# Patient Record
Sex: Female | Born: 1980 | Race: White | Hispanic: No | Marital: Married | Smoking: Never smoker
Health system: Southern US, Community
[De-identification: ages and names within clinical notes are randomized; demographics above are authoritative.]

## PROBLEM LIST (undated history)

## (undated) DIAGNOSIS — R002 Palpitations: Secondary | ICD-10-CM

## (undated) DIAGNOSIS — R0600 Dyspnea, unspecified: Secondary | ICD-10-CM

## (undated) DIAGNOSIS — F419 Anxiety disorder, unspecified: Secondary | ICD-10-CM

## (undated) DIAGNOSIS — O24419 Gestational diabetes mellitus in pregnancy, unspecified control: Secondary | ICD-10-CM

## (undated) HISTORY — DX: Palpitations: R00.2

## (undated) HISTORY — DX: Anxiety disorder, unspecified: F41.9

## (undated) HISTORY — DX: Dyspnea, unspecified: R06.00

## (undated) HISTORY — PX: APPENDECTOMY: SHX54

## (undated) HISTORY — DX: Gestational diabetes mellitus in pregnancy, unspecified control: O24.419

---

## 2003-04-16 ENCOUNTER — Ambulatory Visit (HOSPITAL_COMMUNITY): Admission: RE | Admit: 2003-04-16 | Discharge: 2003-04-16 | Payer: Self-pay | Admitting: Orthopedic Surgery

## 2003-04-16 ENCOUNTER — Encounter: Payer: Self-pay | Admitting: Orthopedic Surgery

## 2004-12-02 ENCOUNTER — Ambulatory Visit: Payer: Self-pay | Admitting: Orthopaedic Surgery

## 2005-10-05 ENCOUNTER — Ambulatory Visit: Payer: Self-pay | Admitting: Internal Medicine

## 2005-10-10 ENCOUNTER — Ambulatory Visit: Payer: Self-pay | Admitting: Internal Medicine

## 2005-10-16 ENCOUNTER — Ambulatory Visit: Payer: Self-pay | Admitting: Rheumatology

## 2005-10-19 ENCOUNTER — Ambulatory Visit: Payer: Self-pay | Admitting: Internal Medicine

## 2005-10-19 ENCOUNTER — Encounter: Payer: Self-pay | Admitting: Internal Medicine

## 2005-10-20 ENCOUNTER — Encounter (INDEPENDENT_AMBULATORY_CARE_PROVIDER_SITE_OTHER): Payer: Self-pay | Admitting: Specialist

## 2005-10-20 ENCOUNTER — Ambulatory Visit: Payer: Self-pay | Admitting: Internal Medicine

## 2009-09-20 ENCOUNTER — Telehealth: Payer: Self-pay | Admitting: Internal Medicine

## 2009-09-20 ENCOUNTER — Encounter: Payer: Self-pay | Admitting: Internal Medicine

## 2009-09-27 ENCOUNTER — Ambulatory Visit: Payer: Self-pay | Admitting: Internal Medicine

## 2009-09-27 DIAGNOSIS — L290 Pruritus ani: Secondary | ICD-10-CM | POA: Insufficient documentation

## 2015-10-18 ENCOUNTER — Encounter: Payer: Self-pay | Admitting: Internal Medicine

## 2018-07-14 ENCOUNTER — Encounter (HOSPITAL_COMMUNITY): Payer: Self-pay | Admitting: Emergency Medicine

## 2018-07-14 ENCOUNTER — Emergency Department (HOSPITAL_COMMUNITY): Payer: Self-pay

## 2018-07-14 ENCOUNTER — Other Ambulatory Visit: Payer: Self-pay

## 2018-07-14 ENCOUNTER — Emergency Department (HOSPITAL_COMMUNITY)
Admission: EM | Admit: 2018-07-14 | Discharge: 2018-07-14 | Disposition: A | Discharge status: Home / Self Care | Payer: Self-pay | Attending: Emergency Medicine | Admitting: Emergency Medicine

## 2018-07-14 ENCOUNTER — Emergency Department (HOSPITAL_COMMUNITY): Admission: EM | Admit: 2018-07-14 | Payer: Self-pay | Source: Home / Self Care

## 2018-07-14 DIAGNOSIS — R253 Fasciculation: Secondary | ICD-10-CM | POA: Insufficient documentation

## 2018-07-14 DIAGNOSIS — R202 Paresthesia of skin: Secondary | ICD-10-CM | POA: Insufficient documentation

## 2018-07-14 DIAGNOSIS — G4700 Insomnia, unspecified: Secondary | ICD-10-CM | POA: Insufficient documentation

## 2018-07-14 LAB — CBC WITH DIFFERENTIAL/PLATELET
Abs Immature Granulocytes: 0 10*3/uL (ref 0.0–0.1)
BASOS ABS: 0.1 10*3/uL (ref 0.0–0.1)
BASOS PCT: 1 %
EOS ABS: 0.1 10*3/uL (ref 0.0–0.7)
Eosinophils Relative: 2 %
HCT: 45.5 % (ref 36.0–46.0)
Hemoglobin: 14.4 g/dL (ref 12.0–15.0)
Immature Granulocytes: 0 %
Lymphocytes Relative: 20 %
Lymphs Abs: 1.5 10*3/uL (ref 0.7–4.0)
MCH: 29 pg (ref 26.0–34.0)
MCHC: 31.6 g/dL (ref 30.0–36.0)
MCV: 91.5 fL (ref 78.0–100.0)
Monocytes Absolute: 0.4 10*3/uL (ref 0.1–1.0)
Monocytes Relative: 5 %
Neutro Abs: 5.6 10*3/uL (ref 1.7–7.7)
Neutrophils Relative %: 72 %
PLATELETS: 271 10*3/uL (ref 150–400)
RBC: 4.97 MIL/uL (ref 3.87–5.11)
RDW: 13.2 % (ref 11.5–15.5)
WBC: 7.7 10*3/uL (ref 4.0–10.5)

## 2018-07-14 LAB — COMPREHENSIVE METABOLIC PANEL
ALT: 15 U/L (ref 0–44)
AST: 18 U/L (ref 15–41)
Albumin: 4.5 g/dL (ref 3.5–5.0)
Alkaline Phosphatase: 46 U/L (ref 38–126)
Anion gap: 8 (ref 5–15)
BUN: 7 mg/dL (ref 6–20)
CHLORIDE: 107 mmol/L (ref 98–111)
CO2: 24 mmol/L (ref 22–32)
Calcium: 9.3 mg/dL (ref 8.9–10.3)
Creatinine, Ser: 0.71 mg/dL (ref 0.44–1.00)
GFR calc Af Amer: >60 mL/min (ref >60)
Glucose, Bld: 94 mg/dL (ref 70–99)
POTASSIUM: 4.1 mmol/L (ref 3.5–5.1)
SODIUM: 139 mmol/L (ref 135–145)
Total Bilirubin: 1 mg/dL (ref 0.3–1.2)
Total Protein: 7 g/dL (ref 6.5–8.1)

## 2018-07-14 LAB — I-STAT BETA HCG BLOOD, ED (MC, WL, AP ONLY)

## 2018-07-14 MED ORDER — GADOBUTROL 1 MMOL/ML IV SOLN
7.5000 mL | Freq: Once | INTRAVENOUS | Status: AC | PRN
  Administered 2018-07-14: 5.5 mL via INTRAVENOUS

## 2018-07-14 MED ORDER — LORAZEPAM 1 MG PO TABS
1.0000 mg | ORAL_TABLET | Freq: Once | ORAL | Status: AC
  Administered 2018-07-14: 1 mg via ORAL
  Filled 2018-07-14: qty 1

## 2018-07-14 MED ORDER — HYDROXYZINE HCL 25 MG PO TABS: 25.0000 mg | ORAL_TABLET | Freq: Four times a day (QID) | ORAL | 0 refills | Status: DC

## 2018-07-14 NOTE — ED Triage Notes (Signed)
Pt. Stated, for the last couple of months, I have not felt like I could rest and something is going on inside my body, I feel like certain parts are twitching. Like this morning I felt like my nose was twitching. It feels like little places in my brain are in pain.It seems to be worse in the last 2 days.

## 2018-07-14 NOTE — ED Provider Notes (Signed)
[facility] EMERGENCY DEPARTMENT Provider Note   CSN: [id number] Arrival date & time: 07/14/18  1011     History   Chief Complaint Chief Complaint  Patient presents with  . Anxiety    HPI Tonya Harris is a 37 y.o. female who presents with pain, twitching. She states that her symptoms started a couple months ago and have gradually worsened. She states that it started with shooting pains in her bilateral hands. This has progressed where she's had sharp shooting pains in her head, chest, lower legs. She also reports spasms/tremors in the upper and lower extremities at times. The symptoms are intermittent but worse at night and she isn't sleeping well. She is getting to the point where ADLs are hard. She reports a mild generalized headache. She has seen her PCP and neurology. She has had extensive blood work done, including a rheumatologic work up, which has been normal. She saw Neurology on 9/6 and was recommended to have a nerve conduction study. This is scheudled in a month however she feels that her symptoms are too much to handle. They recommended EMG/NCS and if that was negative MRI brain w/wo gad and possible c-spine imaging. The patient is breast feeding. She denies recent illness, vision changes, numbness, dizziness, urinary symptoms.    HPI  History reviewed. No pertinent past medical history.  Patient Active Problem List   Diagnosis Date Noted  . ANAL PRURITUS 09/27/2009    History reviewed. No pertinent surgical history.   OB History   None      Home Medications    Prior to Admission medications   Not on File    Family History No family history on file.  Social History Social History   Tobacco Use  . Smoking status: Never Smoker  . Smokeless tobacco: Never Used  Substance Use Topics  . Alcohol use: Not Currently  . Drug use: Not Currently     Allergies   Patient has no allergy information on record.   Review of  Systems Review of Systems  Constitutional: Negative for fever.  Eyes: Negative for visual disturbance.  Respiratory: Negative for shortness of breath.   Cardiovascular: Negative for chest pain.  Gastrointestinal: Negative for abdominal pain.  Genitourinary: Negative for dysuria.  Musculoskeletal: Positive for arthralgias, back pain and myalgias.  Neurological: Positive for tremors and headaches. Negative for dizziness, syncope, facial asymmetry, speech difficulty and numbness.  Psychiatric/Behavioral: The patient is nervous/anxious.   All other systems reviewed and are negative.    Physical Exam Updated Vital Signs BP 116/79 (BP Location: Right Arm)   Pulse 98   Temp 98.7 F (37.1 C)   Resp 17   Ht 5\' 5"  (1.651 m)   Wt 59 kg   LMP 06/30/2018   SpO2 100%   BMI 21.63 kg/m   Physical Exam  Constitutional: She is oriented to person, place, and time. She appears well-developed and well-nourished. No distress.  Calm, cooperative, well-groomed  HENT:  Head: Normocephalic and atraumatic.  Eyes: Pupils are equal, round, and reactive to light. Conjunctivae are normal. Right eye exhibits no discharge. Left eye exhibits no discharge. No scleral icterus.  Neck: Normal range of motion.  Cardiovascular: Normal rate and regular rhythm.  Pulmonary/Chest: Effort normal and breath sounds normal. No respiratory distress.  Abdominal: She exhibits no distension.  Neurological: She is alert and oriented to person, place, and time.  Mental Status:  Alert, oriented, thought content appropriate, able to give a coherent history. Speech  fluent without evidence of aphasia. Able to follow 2 step commands without difficulty.  Cranial Nerves:  II:  Peripheral visual fields grossly normal, pupils equal, round, reactive to light III,IV, VI: ptosis not present, extra-ocular motions intact bilaterally  V,VII: smile symmetric, facial light touch sensation equal VIII: hearing grossly normal to voice  X:  uvula elevates symmetrically  XI: bilateral shoulder shrug symmetric and strong XII: midline tongue extension without fassiculations Motor:  Normal tone. 5/5 in upper and lower extremities bilaterally including strong and equal grip strength and dorsiflexion/plantar flexion Sensory: Pinprick and light touch normal in all extremities.  Deep Tendon Reflexes: Hyper-reflexic in the biceps and patellar reflex Cerebellar: normal finger-to-nose with bilateral upper extremities Gait: normal gait and balance CV: distal pulses palpable throughout    Skin: Skin is warm and dry.  Psychiatric: She has a normal mood and affect. Her behavior is normal.  Nursing note and vitals reviewed.    ED Treatments / Results  Labs (all labs ordered are listed, but only abnormal results are displayed) Labs Reviewed  CBC WITH DIFFERENTIAL/PLATELET  COMPREHENSIVE METABOLIC PANEL  I-STAT BETA HCG BLOOD, ED ([facility], [facility], [facility] ONLY)    EKG None  Radiology Mr Laqueta Jean And Wo Contrast  Result Date: 07/14/2018 CLINICAL DATA:  Multiple sclerosis.  New neurologic event. EXAM: MRI HEAD WITHOUT AND WITH CONTRAST TECHNIQUE: Multiplanar, multiecho pulse sequences of the brain and surrounding structures were obtained without and with intravenous contrast. CONTRAST:  5.5 mL Gadavist COMPARISON:  None. FINDINGS: Brain: No acute infarct, hemorrhage, or mass lesion is present. The ventricles are of normal size. No significant white matter disease is present. Corpus callosum is normal. No restricted diffusion or enhancement is present. The internal auditory canals are within normal limits bilaterally. The brainstem and cerebellum are normal. Vascular: Low is present in the major intracranial arteries. Skull and upper cervical spine: The skull base is within normal limits is normal. Craniocervical junction Sinuses/Orbits: The paranasal sinuses and mastoid air cells are clear. Globes and orbits are within normal limits. IMPRESSION: Negative  MRI the brain. No significant white matter disease is evident. No acute or focal lesion to explain the patient's acute symptoms. Electronically Signed   By: Marin Roberts M.D.   On: 07/14/2018 13:26    Procedures Procedures (including critical care time)  Medications Ordered in ED Medications  LORazepam (ATIVAN) tablet 1 mg (1 mg Oral Given 07/14/18 1137)  gadobutrol (GADAVIST) 1 MMOL/ML injection 7.5 mL (5.5 mLs Intravenous Contrast Given 07/14/18 1312)     Initial Impression / Assessment and Plan / ED Course  I have reviewed the triage vital signs and the nursing notes.  Pertinent labs & imaging results that were available during my care of the patient were reviewed by me and considered in my medical decision making (see chart for details).  37 year old female with vague symptoms of intermittent tingling and jerking over her entire body. Her vitals are normal. Her neurologic exam is essentially normal. She does seem to have some hyperreflexia. Unclear etiology of her symptoms. She does seem to be anxious and it seems that previous providers thought maybe anxiety was a factor. The patient does state that she feels anxious but due to her symptoms. Reviewed old records. She saw a neurologist a couple days ago and MRI of the brain was recommended if EMG studies were negative. We will obtain this today.   MRI is negative. Discussed with the patient and her husband. She has been googling her  symptoms and worried she has a variety of conditions such as ALS or possible spread of herpes in to her brain. She questioned whether or not a LP was indicated. I advised her that it is not and she should call her her neurologist for close follow up. She was given Ativan for her symptoms and reported some improvement. She requested something for anxiety for home. She was given rx for Atarax and advised to not breast feed if she takes this medicine. Her child is 67 year old and she verbalized  understanding.  Final Clinical Impressions(s) / ED Diagnoses   Final diagnoses:  Tingling of both upper extremities  Muscle twitching  Insomnia, unspecified type    ED Discharge Orders    None       Bethel Born, PA-C 07/14/18 1504    Jacalyn Lefevre, MD 07/14/18 1520

## 2018-07-14 NOTE — ED Triage Notes (Signed)
Pt. Stated, Tonya Harris had a visit to my Dr. And everything has came back normal.

## 2018-07-14 NOTE — ED Notes (Signed)
Returned from MRI 

## 2018-07-14 NOTE — Discharge Instructions (Signed)
Please follow up with neurology Take Atarax as needed for sleep. Do not breastfeed if you are taking this medicine

## 2018-07-14 NOTE — ED Notes (Signed)
Patient transported to MRI 

## 2018-08-09 ENCOUNTER — Telehealth: Payer: Self-pay

## 2018-08-09 NOTE — Telephone Encounter (Signed)
Copied from CRM [id number]. Topic: Appointment Scheduling - New Patient >> Aug 08, 2018  4:27 PM Percival Spanish wrote: New patient has been scheduled for your office. Provider:  [facility]  Date of Appointmen 09/20/18 Route to department's PEC pool.

## 2018-08-12 NOTE — Telephone Encounter (Signed)
Np paperwork mailed out on 10/07 ry

## 2018-08-15 NOTE — Telephone Encounter (Signed)
Pt states that she sees Dr. Maryruth Bun and she stated to her that she contacted Dr. Lorin Picket and Dr. Lorin Picket stated she would make an exception for his pt to become a new pt of hers. Please advise and call pt to schedule.

## 2018-08-15 NOTE — Telephone Encounter (Signed)
This ok? 

## 2018-08-16 NOTE — Telephone Encounter (Signed)
Ok

## 2018-08-16 NOTE — Telephone Encounter (Signed)
Lm for pt to call back and ask for Carlyon Shadow

## 2018-08-20 NOTE — Telephone Encounter (Signed)
Pt called back for [facility]. Per Baxter Hire she is gone for today. Pt states ok to call back at [phone].

## 2018-08-21 NOTE — Telephone Encounter (Signed)
Patient calling and states that she would like to speak with Morrie Sheldon. Spoke with office and was told that Morrie Sheldon is out of the office today and will be back tomorrow. Patient informed of message and would like a call back from [place]. Please advise.

## 2018-08-21 NOTE — Telephone Encounter (Signed)
Pt calling back to speak with Morrie Sheldon C.

## 2018-08-21 NOTE — Telephone Encounter (Signed)
I scheduled this patient for December 10 at 9:00 and told her to make sure new patient paperwork was completed before arrival for appt. Advised that Morrie Sheldon was not in the office today and that one of [place] would contact her if appt was able to be moved up.

## 2018-09-02 ENCOUNTER — Ambulatory Visit (INDEPENDENT_AMBULATORY_CARE_PROVIDER_SITE_OTHER): Payer: Self-pay | Admitting: Internal Medicine

## 2018-09-02 ENCOUNTER — Encounter: Payer: Self-pay | Admitting: Internal Medicine

## 2018-09-02 DIAGNOSIS — F419 Anxiety disorder, unspecified: Secondary | ICD-10-CM

## 2018-09-02 DIAGNOSIS — L749 Eccrine sweat disorder, unspecified: Secondary | ICD-10-CM

## 2018-09-02 DIAGNOSIS — R253 Fasciculation: Secondary | ICD-10-CM | POA: Insufficient documentation

## 2018-09-02 DIAGNOSIS — Z8632 Personal history of gestational diabetes: Secondary | ICD-10-CM

## 2018-09-02 DIAGNOSIS — M791 Myalgia, unspecified site: Secondary | ICD-10-CM

## 2018-09-02 LAB — CBC WITH DIFFERENTIAL/PLATELET
Basophils Absolute: 0 10*3/uL (ref 0.0–0.1)
Basophils Relative: 0.5 % (ref 0.0–3.0)
EOS ABS: 0.1 10*3/uL (ref 0.0–0.7)
Eosinophils Relative: 2.1 % (ref 0.0–5.0)
HEMATOCRIT: 39 % (ref 36.0–46.0)
HEMOGLOBIN: 12.9 g/dL (ref 12.0–15.0)
LYMPHS PCT: 24.4 % (ref 12.0–46.0)
Lymphs Abs: 1.6 10*3/uL (ref 0.7–4.0)
MCHC: 33.1 g/dL (ref 30.0–36.0)
MCV: 88.5 fl (ref 78.0–100.0)
MONOS PCT: 4.5 % (ref 3.0–12.0)
Monocytes Absolute: 0.3 10*3/uL (ref 0.1–1.0)
NEUTROS ABS: 4.5 10*3/uL (ref 1.4–7.7)
Neutrophils Relative %: 68.5 % (ref 43.0–77.0)
PLATELETS: 278 10*3/uL (ref 150.0–400.0)
RBC: 4.4 Mil/uL (ref 3.87–5.11)
RDW: 13.6 % (ref 11.5–15.5)
WBC: 6.5 10*3/uL (ref 4.0–10.5)

## 2018-09-02 LAB — SEDIMENTATION RATE: Sed Rate: 1 mm/hr (ref 0–20)

## 2018-09-02 LAB — COMPREHENSIVE METABOLIC PANEL
ALBUMIN: 4.7 g/dL (ref 3.5–5.2)
ALK PHOS: 38 U/L — AB (ref 39–117)
ALT: 14 U/L (ref 0–35)
AST: 15 U/L (ref 0–37)
BILIRUBIN TOTAL: 0.6 mg/dL (ref 0.2–1.2)
BUN: 10 mg/dL (ref 6–23)
CO2: 28 mEq/L (ref 19–32)
CREATININE: 0.68 mg/dL (ref 0.40–1.20)
Calcium: 9.5 mg/dL (ref 8.4–10.5)
Chloride: 103 mEq/L (ref 96–112)
GFR: 104.44 mL/min (ref >60.00)
Glucose, Bld: 86 mg/dL (ref 70–99)
Potassium: 4.7 mEq/L (ref 3.5–5.1)
Sodium: 138 mEq/L (ref 135–145)
TOTAL PROTEIN: 6.9 g/dL (ref 6.0–8.3)

## 2018-09-02 LAB — CK: Total CK: 173 U/L (ref 7–177)

## 2018-09-02 LAB — TSH: TSH: 2.37 u[IU]/mL (ref 0.35–4.50)

## 2018-09-02 LAB — VITAMIN B12: Vitamin B-12: 791 pg/mL (ref 211–911)

## 2018-09-02 NOTE — Progress Notes (Signed)
Patient ID: Tonya Harris, female   DOB: Jan 19, 1981, 37 y.o.   MRN: [id number]   Subjective:    Patient ID: Tonya Harris, female    DOB: [birth year], 37 y.o.   MRN: [id number]  HPI  Patient here to establish care.  She has been healthy.  Has a history of gestational diabetes.  Has four children.  Tries to stay active.  Recently has been having problems with needle sensations all over her body.  Intermittent stabbing pain.  Also has noticed some muscle twitching and fatigue.  Full body fatigue.  Also has noticed hand tremor and muscles "jumping".  Legs tired with walking.  Aching at night.  Saw Dr Janith Lima at [facility] in [place], [place].  Had imaging and NCS - electromyography - normal.  MRI c-spine - 08/02/18 - no disc herniation or significant stenosis.  Normal cord signal.  MRI brain - negative.  Has seen psychiatry.  Diagnosed with anxiety.  On trazodone to help her sleep.  Also on sertraline, neurontin prn and abilify.  States her thoughts are better.  Still with the above symptoms.  Also describes increased sweating.  (states she is drenched).  Has noticed some red spots under her nails.     Past Medical History:  Diagnosis Date  . Gestational diabetes    Past Surgical History:  Procedure Laterality Date  . APPENDECTOMY     Family History  Problem Relation Age of Onset  . Hypertension Mother   . Hypertension Father    Social History   Socioeconomic History  . Marital status: Married    Spouse name: Not on file  . Number of children: Not on file  . Years of education: Not on file  . Highest education level: Not on file  Occupational History  . Not on file  Social Needs  . Financial resource strain: Not on file  . Food insecurity:    Worry: Not on file    Inability: Not on file  . Transportation needs:    Medical: Not on file    Non-medical: Not on file  Tobacco Use  . Smoking status: Never Smoker  . Smokeless tobacco: Never Used  Substance and Sexual Activity  .  Alcohol use: Not Currently  . Drug use: Not Currently  . Sexual activity: Yes  Lifestyle  . Physical activity:    Days per week: Not on file    Minutes per session: Not on file  . Stress: Not on file  Relationships  . Social connections:    Talks on phone: Not on file    Gets together: Not on file    Attends religious service: Not on file    Active member of club or organization: Not on file    Attends meetings of clubs or organizations: Not on file    Relationship status: Not on file  Other Topics Concern  . Not on file  Social History Narrative  . Not on file    Outpatient Encounter Medications as of 09/02/2018  Medication Sig  . ARIPiprazole (ABILIFY) 5 MG tablet Take 5 mg by mouth daily.  Marland Kitchen gabapentin (NEURONTIN) 100 MG capsule Take 100 mg by mouth 2 (two) times daily as needed.  . sertraline (ZOLOFT) 100 MG tablet Take 100 mg by mouth 2 (two) times daily.  . traZODone (DESYREL) 100 MG tablet Take 200 mg by mouth at bedtime.  . Prenatal Multivit-Min-Fe-FA (PRENATAL VITAMINS PO) Take by mouth.  . [DISCONTINUED] hydrOXYzine (ATARAX/VISTARIL) 25 MG tablet Take  1 tablet (25 mg total) by mouth every 6 (six) hours.   No facility-administered encounter medications on file as of 09/02/2018.     Review of Systems  Constitutional: Positive for fatigue. Negative for appetite change.  HENT: Negative for congestion and sinus pressure.   Respiratory: Negative for cough, chest tightness and shortness of breath.   Cardiovascular: Negative for chest pain and leg swelling.  Gastrointestinal: Negative for abdominal pain, diarrhea, nausea and vomiting.  Genitourinary: Negative for difficulty urinating and dysuria.  Musculoskeletal: Negative for joint swelling and myalgias.  Skin: Negative for color change.       Red spots under nails.    Neurological: Negative for dizziness and headaches.       Muscle twitching and stabbing pain as outlined.  Hand tremor.  Aching at night.      Psychiatric/Behavioral: Positive for sleep disturbance.       Increased stress and anxiety as outlined.        Objective:    Physical Exam  Constitutional: She appears well-developed and well-nourished. No distress.  HENT:  Nose: Nose normal.  Mouth/Throat: Oropharynx is clear and moist.  Neck: Neck supple. No thyromegaly present.  Cardiovascular: Normal rate and regular rhythm.  Pulmonary/Chest: Breath sounds normal. No respiratory distress. She has no wheezes.  Abdominal: Soft. Bowel sounds are normal. There is no tenderness.  Musculoskeletal: She exhibits no edema or tenderness.  Motor strength - normal upper and lower extremities.  Gait - wnl.    Lymphadenopathy:    She has no cervical adenopathy.  Skin: No rash noted. No erythema.  Psychiatric: She has a normal mood and affect. Her behavior is normal.    BP 102/68 (BP Location: Left Arm, Patient Position: Sitting, Cuff Size: Normal)   Pulse 72   Temp 97.9 F (36.6 C) (Oral)   Resp 18   Ht '5\' 5"'  (1.651 m)   Wt 139 lb 9.6 oz (63.3 kg)   SpO2 100%   BMI 23.23 kg/m  Wt Readings from Last 3 Encounters:  09/02/18 139 lb 9.6 oz (63.3 kg)  07/14/18 130 lb (59 kg)     Lab Results  Component Value Date   WBC 6.5 09/02/2018   HGB 12.9 09/02/2018   HCT 39.0 09/02/2018   PLT 278.0 09/02/2018   GLUCOSE 86 09/02/2018   ALT 14 09/02/2018   AST 15 09/02/2018   NA 138 09/02/2018   K 4.7 09/02/2018   CL 103 09/02/2018   CREATININE 0.68 09/02/2018   BUN 10 09/02/2018   CO2 28 09/02/2018   TSH 2.37 09/02/2018    Mr Brain W And Wo Contrast  Result Date: 07/14/2018 CLINICAL DATA:  Multiple sclerosis.  New neurologic event. EXAM: MRI HEAD WITHOUT AND WITH CONTRAST TECHNIQUE: Multiplanar, multiecho pulse sequences of the brain and surrounding structures were obtained without and with intravenous contrast. CONTRAST:  5.5 mL Gadavist COMPARISON:  None. FINDINGS: Brain: No acute infarct, hemorrhage, or mass lesion is present.  The ventricles are of normal size. No significant white matter disease is present. Corpus callosum is normal. No restricted diffusion or enhancement is present. The internal auditory canals are within normal limits bilaterally. The brainstem and cerebellum are normal. Vascular: Low is present in the major intracranial arteries. Skull and upper cervical spine: The skull base is within normal limits is normal. Craniocervical junction Sinuses/Orbits: The paranasal sinuses and mastoid air cells are clear. Globes and orbits are within normal limits. IMPRESSION: Negative MRI the brain. No significant white matter  disease is evident. No acute or focal lesion to explain the patient's acute symptoms. Electronically Signed   By: San Morelle M.D.   On: 07/14/2018 13:26       Assessment & Plan:   Problem List Items Addressed This Visit    Anxiety    Seeing Dr Nicolasa Ducking.  On trazodone to help her sleep.  Also taking zoloft and abilify.  Continue f/u with Dr Nicolasa Ducking.        Relevant Medications   sertraline (ZOLOFT) 100 MG tablet   traZODone (DESYREL) 100 MG tablet   History of gestational diabetes    Follow sugar.        Muscle ache    Unclear etiology.  No focal deficits noted on exam.  MRI brain and c-spine as outlined.  Saw neurology.  NCS/EMG - ok.  Check esr and ck.  Also confirm thyroid and B12 wnl.  Discuss with neurology regarding further w/up.        Relevant Orders   CBC with Differential/Platelet (Completed)   Comprehensive metabolic panel (Completed)   TSH (Completed)   Sedimentation rate (Completed)   CK (Creatine Kinase) (Completed)   Muscle twitching    Unclear etiology.  W/up as outlined.  NCS/EMG - ok.  MRI c-spine and MRI brain - normal.  Saw neurology.  D/w neurology regarding further w/up.        Relevant Orders   Vitamin B12 (Completed)   Sweating abnormality    Increased sweating as outlined.  Check thyroid and routine labs.  W/up as outlined. D/w neurology.            Einar Pheasant, MD

## 2018-09-03 ENCOUNTER — Encounter: Payer: Self-pay | Admitting: Internal Medicine

## 2018-09-08 ENCOUNTER — Encounter: Payer: Self-pay | Admitting: Internal Medicine

## 2018-09-08 DIAGNOSIS — L749 Eccrine sweat disorder, unspecified: Secondary | ICD-10-CM | POA: Insufficient documentation

## 2018-09-08 DIAGNOSIS — Z8632 Personal history of gestational diabetes: Secondary | ICD-10-CM | POA: Insufficient documentation

## 2018-09-08 DIAGNOSIS — F419 Anxiety disorder, unspecified: Secondary | ICD-10-CM | POA: Insufficient documentation

## 2018-09-08 NOTE — Assessment & Plan Note (Signed)
Unclear etiology.  W/up as outlined.  NCS/EMG - ok.  MRI c-spine and MRI brain - normal.  Saw neurology.  D/w neurology regarding further w/up.

## 2018-09-08 NOTE — Assessment & Plan Note (Signed)
Increased sweating as outlined.  Check thyroid and routine labs.  W/up as outlined. D/w neurology.

## 2018-09-08 NOTE — Assessment & Plan Note (Signed)
Seeing Dr Maryruth Bun.  On trazodone to help her sleep.  Also taking zoloft and abilify.  Continue f/u with Dr Maryruth Bun.

## 2018-09-08 NOTE — Assessment & Plan Note (Signed)
Follow sugar.

## 2018-09-08 NOTE — Assessment & Plan Note (Signed)
Unclear etiology.  No focal deficits noted on exam.  MRI brain and c-spine as outlined.  Saw neurology.  NCS/EMG - ok.  Check esr and ck.  Also confirm thyroid and B12 wnl.  Discuss with neurology regarding further w/up.

## 2018-09-20 ENCOUNTER — Ambulatory Visit: Payer: Self-pay | Admitting: Internal Medicine

## 2018-10-08 ENCOUNTER — Other Ambulatory Visit: Payer: Self-pay | Admitting: Neurology

## 2018-10-08 DIAGNOSIS — M791 Myalgia, unspecified site: Secondary | ICD-10-CM

## 2018-10-15 ENCOUNTER — Ambulatory Visit: Payer: Self-pay | Admitting: Internal Medicine

## 2018-10-25 ENCOUNTER — Ambulatory Visit
Admission: RE | Admit: 2018-10-25 | Discharge: 2018-10-25 | Disposition: A | Discharge status: Home / Self Care | Payer: Self-pay | Source: Ambulatory Visit | Attending: Neurology | Admitting: Neurology

## 2018-10-25 DIAGNOSIS — M5134 Other intervertebral disc degeneration, thoracic region: Secondary | ICD-10-CM | POA: Insufficient documentation

## 2018-10-25 DIAGNOSIS — M791 Myalgia, unspecified site: Secondary | ICD-10-CM | POA: Insufficient documentation

## 2018-10-25 DIAGNOSIS — R252 Cramp and spasm: Secondary | ICD-10-CM | POA: Insufficient documentation

## 2018-10-25 DIAGNOSIS — R61 Generalized hyperhidrosis: Secondary | ICD-10-CM | POA: Insufficient documentation

## 2018-12-19 ENCOUNTER — Encounter: Payer: Self-pay | Admitting: Internal Medicine

## 2018-12-19 ENCOUNTER — Other Ambulatory Visit (HOSPITAL_COMMUNITY)
Admission: RE | Admit: 2018-12-19 | Discharge: 2018-12-19 | Disposition: A | Discharge status: Home / Self Care | Payer: Self-pay | Source: Ambulatory Visit | Attending: Internal Medicine | Admitting: Internal Medicine

## 2018-12-19 ENCOUNTER — Ambulatory Visit (INDEPENDENT_AMBULATORY_CARE_PROVIDER_SITE_OTHER): Payer: Self-pay | Admitting: Internal Medicine

## 2018-12-19 VITALS — BP 110/70 | HR 78 | Temp 97.8°F | Wt 151.8 lb

## 2018-12-19 DIAGNOSIS — Z124 Encounter for screening for malignant neoplasm of cervix: Secondary | ICD-10-CM

## 2018-12-19 DIAGNOSIS — R253 Fasciculation: Secondary | ICD-10-CM

## 2018-12-19 DIAGNOSIS — Z0000 Encounter for general adult medical examination without abnormal findings: Secondary | ICD-10-CM

## 2018-12-19 DIAGNOSIS — M791 Myalgia, unspecified site: Secondary | ICD-10-CM

## 2018-12-19 DIAGNOSIS — F419 Anxiety disorder, unspecified: Secondary | ICD-10-CM

## 2018-12-19 DIAGNOSIS — R1011 Right upper quadrant pain: Secondary | ICD-10-CM

## 2018-12-19 NOTE — Assessment & Plan Note (Addendum)
Physical today 12/19/18.  PAP 12/19/18.  Will notify me when agreeable for baseline mammogram.

## 2018-12-19 NOTE — Progress Notes (Signed)
Patient ID: Tonya Harris, female   DOB: 16-Aug-1981, 38 y.o.   MRN: [id number]   Subjective:    Patient ID: Tonya Harris, female    DOB: 1981/04/29, 38 y.o.   MRN: [id number]  HPI  Patient here for her physical exam.  She reports she is doing some better.  Still seeing Dr Nicolasa Ducking.  Anxiety is some better.  Seeing Dr Manuella Ghazi.  Still with some muscle pain and twitching with fatigue and weakness.  On gabapentin.  Had MRI thoracic spine - revealed mild thoracic kyphosis with mild disc degeneration otherwise unremarkable.  Was referred to [facility] neurology.  Has appt with Dr Lafayette Dragon.  Has appt scheduled in April.  Tries to stay active.  No chest pain.  No sob.  Does report some increased belching.  Also had an episode recently of right upper quadrant pain.  Lasted a few days.  Sharp pain.  Some diarrhea.  No vomiting.  Reports a few months ago, had similar episode.  Discussed possible etiologies.  Discussed possible gallbladder.  Agreeable to ultrasound.     Past Medical History:  Diagnosis Date  . Gestational diabetes    Past Surgical History:  Procedure Laterality Date  . APPENDECTOMY     Family History  Problem Relation Age of Onset  . Hypertension Mother   . Hypertension Father    Social History   Socioeconomic History  . Marital status: Married    Spouse name: Not on file  . Number of children: Not on file  . Years of education: Not on file  . Highest education level: Not on file  Occupational History  . Not on file  Social Needs  . Financial resource strain: Not on file  . Food insecurity:    Worry: Not on file    Inability: Not on file  . Transportation needs:    Medical: Not on file    Non-medical: Not on file  Tobacco Use  . Smoking status: Never Smoker  . Smokeless tobacco: Never Used  Substance and Sexual Activity  . Alcohol use: Not Currently  . Drug use: Not Currently  . Sexual activity: Yes  Lifestyle  . Physical activity:    Days per week: Not on file      Minutes per session: Not on file  . Stress: Not on file  Relationships  . Social connections:    Talks on phone: Not on file    Gets together: Not on file    Attends religious service: Not on file    Active member of club or organization: Not on file    Attends meetings of clubs or organizations: Not on file    Relationship status: Not on file  Other Topics Concern  . Not on file  Social History Narrative  . Not on file    Outpatient Encounter Medications as of 12/19/2018  Medication Sig  . ARIPiprazole (ABILIFY) 5 MG tablet Take 5 mg by mouth daily.  Marland Kitchen gabapentin (NEURONTIN) 100 MG capsule Take 100 mg by mouth 2 (two) times daily as needed.  . Prenatal Multivit-Min-Fe-FA (PRENATAL VITAMINS PO) Take by mouth.  . sertraline (ZOLOFT) 100 MG tablet Take 100 mg by mouth 2 (two) times daily.  . traZODone (DESYREL) 100 MG tablet Take 200 mg by mouth at bedtime.   No facility-administered encounter medications on file as of 12/19/2018.     Review of Systems  Constitutional: Positive for fatigue. Negative for appetite change.  HENT: Negative for congestion and sinus  pressure.   Eyes: Negative for pain and visual disturbance.  Respiratory: Negative for cough, chest tightness and shortness of breath.   Cardiovascular: Negative for chest pain, palpitations and leg swelling.  Gastrointestinal:       Previous episodes of RUQ pain as outlined.  previousl diarrhea.  None now.  Eating.    Genitourinary: Negative for difficulty urinating and dysuria.  Musculoskeletal: Negative for joint swelling.       Muscle aching and cramping as outlined.    Skin: Negative for color change and rash.  Neurological: Negative for dizziness, light-headedness and headaches.  Hematological: Negative for adenopathy. Does not bruise/bleed easily.  Psychiatric/Behavioral:       Anxiety improved.  Overall handling things better.  Feels better.  Seeing Dr Nicolasa Ducking.        Objective:    Physical  Exam Constitutional:      General: She is not in acute distress.    Appearance: Normal appearance. She is well-developed.  HENT:     Nose: Nose normal. No congestion.     Mouth/Throat:     Pharynx: No oropharyngeal exudate or posterior oropharyngeal erythema.  Eyes:     General: No scleral icterus.       Right eye: No discharge.        Left eye: No discharge.  Neck:     Musculoskeletal: Neck supple. No muscular tenderness.     Thyroid: No thyromegaly.  Cardiovascular:     Rate and Rhythm: Normal rate and regular rhythm.  Pulmonary:     Effort: No tachypnea, accessory muscle usage or respiratory distress.     Breath sounds: Normal breath sounds. No decreased breath sounds or wheezing.  Chest:     Breasts:        Right: No inverted nipple, mass, nipple discharge or tenderness (no axillary adenopathy).        Left: No inverted nipple, mass, nipple discharge or tenderness (no axilarry adenopathy).  Abdominal:     General: Bowel sounds are normal.     Palpations: Abdomen is soft.     Tenderness: There is no abdominal tenderness.  Genitourinary:    Comments: Normal external genitalia.  Vaginal vault without lesions.  Cervix identified.  Pap smear performed.  Could not appreciate any adnexal masses or tenderness.   Musculoskeletal:        General: No swelling or tenderness.  Skin:    Findings: No erythema or rash.  Neurological:     Mental Status: She is alert and oriented to person, place, and time.  Psychiatric:        Mood and Affect: Mood normal.        Behavior: Behavior normal.     BP 110/70   Pulse 78   Temp 97.8 F (36.6 C) (Oral)   Wt 151 lb 12.8 oz (68.9 kg)   LMP 11/25/2018   SpO2 98%   BMI 25.26 kg/m  Wt Readings from Last 3 Encounters:  12/19/18 151 lb 12.8 oz (68.9 kg)  09/02/18 139 lb 9.6 oz (63.3 kg)  07/14/18 130 lb (59 kg)     Lab Results  Component Value Date   WBC 6.5 09/02/2018   HGB 12.9 09/02/2018   HCT 39.0 09/02/2018   PLT 278.0  09/02/2018   GLUCOSE 86 09/02/2018   ALT 14 09/02/2018   AST 15 09/02/2018   NA 138 09/02/2018   K 4.7 09/02/2018   CL 103 09/02/2018   CREATININE 0.68 09/02/2018   BUN 10  09/02/2018   CO2 28 09/02/2018   TSH 2.37 09/02/2018    Mr Thoracic Spine Wo Contrast  Result Date: 10/26/2018 CLINICAL DATA:  Back pain.  Leg pain and cramping.  Muscle pain. EXAM: MRI THORACIC SPINE WITHOUT CONTRAST TECHNIQUE: Multiplanar, multisequence MR imaging of the thoracic spine was performed. No intravenous contrast was administered. COMPARISON:  None. FINDINGS: Alignment:  Normal alignment.  Mild thoracic kyphosis Vertebrae: Mild discogenic edema anteriorly at T6-7, T7-8, T8-9 associated with kyphosis. Negative for fracture or mass Cord:  Normal signal and morphology Paraspinal and other soft tissues: Tiny pleural effusions bilaterally. No paraspinous soft tissue mass Disc levels: Mild disc degeneration with disc space narrowing and mild discogenic edema anteriorly at T6-7, T7-8, T8-9. No Schmorl's nodes. No other signs of Scheuermann's disease. Negative for disc protrusion or spinal stenosis. 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 No disc protrusion or stenosis. No other signs of Scheuermann's disease. Electronically Signed   By: Franchot Gallo M.D.   On: 10/26/2018 07:30       Assessment & Plan:   Problem List Items Addressed This Visit    Abdominal pain    Intermittent episodes of pain with burping and diarrhea as outlined.  Check abdominal ultrasound.  Consider a trial of pepcid.  Follow.        Relevant Orders   US Abdomen Complete   Anxiety    Being followed by Dr Nicolasa Ducking.  Does feel better.        Healthcare maintenance    Physical today 12/19/18.  PAP 12/19/18.  Will notify me when agreeable for baseline mammogram.        Muscle ache    Unclear etiology.  Has had extensive w/up.  Has seen Dr Manuella Ghazi.  Notes reviewed.  Recent CK, ESR wnl.   Just had MRI thoracic spine - mild DDD.  Planning to f/u with [place] neurology.  Has appt scheduled.        Muscle twitching    Unclear etiology.  NCS/EMG - ok.  MRI c-spine and MRI brain normal.  Just had MRI thoracic spine.  Referred to [facility] neurology.  Has appt scheduled.         Other Visit Diagnoses    Routine general medical examination at a health care facility    -  Primary   Cervical cancer screening       Relevant Orders   Cytology - PAP( [place]) (Completed)       Einar Pheasant, MD

## 2018-12-20 LAB — CYTOLOGY - PAP
Diagnosis: NEGATIVE
HPV: NOT DETECTED

## 2018-12-21 ENCOUNTER — Encounter: Payer: Self-pay | Admitting: Internal Medicine

## 2018-12-22 ENCOUNTER — Encounter: Payer: Self-pay | Admitting: Internal Medicine

## 2018-12-22 DIAGNOSIS — R109 Unspecified abdominal pain: Secondary | ICD-10-CM | POA: Insufficient documentation

## 2018-12-22 NOTE — Assessment & Plan Note (Signed)
Intermittent episodes of pain with burping and diarrhea as outlined.  Check abdominal ultrasound.  Consider a trial of pepcid.  Follow.

## 2018-12-22 NOTE — Assessment & Plan Note (Signed)
Unclear etiology.  Has had extensive w/up.  Has seen Dr Manuella Ghazi.  Notes reviewed.  Recent CK, ESR wnl.  Just had MRI thoracic spine - mild DDD.  Planning to f/u with [place] [facility] neurology.  Has appt scheduled.

## 2018-12-22 NOTE — Assessment & Plan Note (Signed)
Being followed by Dr Maryruth Bun.  Does feel better.

## 2018-12-22 NOTE — Assessment & Plan Note (Signed)
Unclear etiology.  NCS/EMG - ok.  MRI c-spine and MRI brain normal.  Just had MRI thoracic spine.  Referred to [facility] neurology.  Has appt scheduled.

## 2019-01-03 ENCOUNTER — Ambulatory Visit
Admission: RE | Admit: 2019-01-03 | Discharge: 2019-01-03 | Disposition: A | Discharge status: Home / Self Care | Payer: Self-pay | Source: Ambulatory Visit | Attending: Internal Medicine | Admitting: Internal Medicine

## 2019-01-03 DIAGNOSIS — R1011 Right upper quadrant pain: Secondary | ICD-10-CM | POA: Insufficient documentation

## 2019-01-04 ENCOUNTER — Encounter: Payer: Self-pay | Admitting: Internal Medicine

## 2019-04-07 ENCOUNTER — Encounter: Payer: Self-pay | Admitting: Internal Medicine

## 2019-04-07 ENCOUNTER — Other Ambulatory Visit: Payer: Self-pay

## 2019-04-07 ENCOUNTER — Ambulatory Visit (INDEPENDENT_AMBULATORY_CARE_PROVIDER_SITE_OTHER): Payer: Self-pay | Admitting: Internal Medicine

## 2019-04-07 DIAGNOSIS — R253 Fasciculation: Secondary | ICD-10-CM

## 2019-04-07 DIAGNOSIS — F419 Anxiety disorder, unspecified: Secondary | ICD-10-CM

## 2019-04-07 DIAGNOSIS — M533 Sacrococcygeal disorders, not elsewhere classified: Secondary | ICD-10-CM

## 2019-04-07 NOTE — Progress Notes (Signed)
Patient ID: Tonya Harris, female   DOB: 04/15/1981, 38 y.o.   MRN: [id number]   Virtual Visit via video Note  This visit type was conducted due to national recommendations for restrictions regarding the COVID-19 pandemic (e.g. social distancing).  This format is felt to be most appropriate for this patient at this time.  All issues noted in this document were discussed and addressed.  No physical exam was performed (except for noted visual exam findings with Video Visits).   I connected with Tonya Harris by a video enabled telemedicine application and verified that I am speaking with the correct person using two identifiers. Location patient: home Location provider: work Persons participating in the virtual visit: patient, provider  I discussed the limitations, risks, security and privacy concerns of performing an evaluation and management service by video and the availability of in person appointments. The patient expressed understanding and agreed to proceed.   Reason for visit: scheduled follow up.    HPI: She has [facility] for continued muscle/joint pain.  There was a question if she had cramp fasciculation syndrome and EMG was unremarkable.  Taking gabapentin.  Makes her sleepy during the day.  Still having muscle cramps that come and go.  Also, will notice twitching in her muscles and has fasciculations.  Also reports needlelike pains in multiple areas of her body.  Recently has noticed increased pain in the coccygeal area when she stands.  States occurs when she goes from sitting to standing.  Increased pain.  No injury.  No significant pain once she is up and moving.  No fever.  No rash.  Also has noticed redness under her nails.  Noticed changes c/w raynauds - fingers.  Trying to stay in due to covid restrictions.  No fever.  No chest pain, chest congestion, sob or cough.  Stomach better.  Eating.  No acid reflux.  No abdominal pain reported.        ROS: See pertinent positives and negatives per HPI.  Past Medical History:  Diagnosis Date  . Gestational diabetes     Past Surgical History:  Procedure Laterality Date  . APPENDECTOMY      Family History  Problem Relation Age of Onset  . Hypertension Mother   . Hypertension Father     SOCIAL HX: reviewed.    Current Outpatient Medications:  .  DULoxetine (CYMBALTA) 60 MG capsule, Take 60 mg by mouth daily., Disp: , Rfl:  .  gabapentin (NEURONTIN) 100 MG capsule, Take 100 mg by mouth 2 (two) times daily as needed., Disp: , Rfl:  .  Prenatal Multivit-Min-Fe-FA (PRENATAL VITAMINS PO), Take by mouth., Disp: , Rfl:   EXAM:  GENERAL: alert, oriented, appears well and in no acute distress  HEENT: atraumatic, conjunttiva clear, no obvious abnormalities on inspection of external nose and ears  NECK: normal movements of the head and neck  LUNGS: on inspection no signs of respiratory distress, breathing rate appears normal, no obvious gross SOB, gasping or wheezing  CV: no obvious cyanosis  PSYCH/NEURO: pleasant and cooperative, no obvious depression or anxiety, speech and thought processing grossly intact  ASSESSMENT AND PLAN:  Discussed the following assessment and plan:  Muscle twitching  Anxiety  Coccygeal pain  Muscle twitching Has seen Dr Sherryll BurgerShah and Dr Ron ParkerMorganlander.  EMG unrevealing.  W/up so far unrevealing.  Discussed with her today - unclear etiology.  Dr Ron ParkerMorganlander had discussed changing her to lyrica.  On gabapentin now.  Also taking  cymbalta.  Continue f/u with neurology.   Anxiety Seeing psychiatry.  On cymbalta.  Feels better.    Coccygeal pain Pain as outlined in her coccygeal region.  Hurts when she goes from sitting to standing.  No pain with walking.  Given multiple symptoms and persistent pain, discussed referral to rheumatology for further evaluation.      I discussed the assessment and treatment plan with the patient. The patient was provided an  opportunity to ask questions and all were answered. The patient agreed with the plan and demonstrated an understanding of the instructions.   The patient was advised to call back or seek an in-person evaluation if the symptoms worsen or if the condition fails to improve as anticipated.    Dale [place], MD

## 2019-04-08 ENCOUNTER — Encounter: Payer: Self-pay | Admitting: Internal Medicine

## 2019-04-08 DIAGNOSIS — M2550 Pain in unspecified joint: Secondary | ICD-10-CM

## 2019-04-12 ENCOUNTER — Encounter: Payer: Self-pay | Admitting: Internal Medicine

## 2019-04-12 DIAGNOSIS — M533 Sacrococcygeal disorders, not elsewhere classified: Secondary | ICD-10-CM | POA: Insufficient documentation

## 2019-04-12 NOTE — Assessment & Plan Note (Signed)
Pain as outlined in her coccygeal region.  Hurts when she goes from sitting to standing.  No pain with walking.  Given multiple symptoms and persistent pain, discussed referral to rheumatology for further evaluation.

## 2019-04-12 NOTE — Assessment & Plan Note (Signed)
Seeing psychiatry.  On cymbalta.  Feels better.

## 2019-04-12 NOTE — Assessment & Plan Note (Signed)
Has seen Dr Manuella Ghazi and Dr Delsa Sale.  EMG unrevealing.  W/up so far unrevealing.  Discussed with her today - unclear etiology.  Dr Delsa Sale had discussed changing her to lyrica.  On gabapentin now.  Also taking cymbalta.  Continue f/u with neurology.

## 2019-04-15 NOTE — Telephone Encounter (Signed)
Order placed for rheumatology referral.  

## 2019-06-19 ENCOUNTER — Other Ambulatory Visit: Payer: Self-pay

## 2019-06-19 ENCOUNTER — Ambulatory Visit (INDEPENDENT_AMBULATORY_CARE_PROVIDER_SITE_OTHER): Payer: Self-pay | Admitting: Internal Medicine

## 2019-06-19 ENCOUNTER — Encounter: Payer: Self-pay | Admitting: Internal Medicine

## 2019-06-19 DIAGNOSIS — F419 Anxiety disorder, unspecified: Secondary | ICD-10-CM

## 2019-06-19 DIAGNOSIS — E162 Hypoglycemia, unspecified: Secondary | ICD-10-CM

## 2019-06-19 DIAGNOSIS — Z8632 Personal history of gestational diabetes: Secondary | ICD-10-CM

## 2019-06-19 DIAGNOSIS — L749 Eccrine sweat disorder, unspecified: Secondary | ICD-10-CM

## 2019-06-19 DIAGNOSIS — M791 Myalgia, unspecified site: Secondary | ICD-10-CM

## 2019-06-19 DIAGNOSIS — R531 Weakness: Secondary | ICD-10-CM

## 2019-06-19 DIAGNOSIS — M533 Sacrococcygeal disorders, not elsewhere classified: Secondary | ICD-10-CM

## 2019-06-19 NOTE — Progress Notes (Signed)
Patient ID: Tonya Harris, female   DOB: [birth year], 38 y.o.   MRN: [id number]   Virtual Visit via video Note  This visit type was conducted due to national recommendations for restrictions regarding the COVID-19 pandemic (e.g. social distancing).  This format is felt to be most appropriate for this patient at this time.  All issues noted in this document were discussed and addressed.  No physical exam was performed (except for noted visual exam findings with Video Visits).   I connected with Tonya Harris by a video enabled telemedicine application and verified that I am speaking with the correct person using two identifiers. Location patient: home Location provider: work or home office Persons participating in the virtual visit: patient, provider  I discussed the limitations, risks, security and privacy concerns of performing an evaluation and management service by video and the availability of in person appointments. The patient expressed understanding and agreed to proceed.   Reason for visit:  Scheduled follow up.   HPI: She saw Dr Manuella Ghazi 04/23/19 for f/u muscle pain, muscle twitching and cramps.  Discussed the possibility of fibromyalgia.  lyrica increased.  She has seen neurology - [facility].  W/up unrevealing.  EMG/nerve conduction study - normal.  Reports having neck stiffness.  When turns - catches.  Some intermittent tremors.  Feels weak.  Describes episodes of feeling weak after being up and moving around.  Feels needs to sit down.  Was questioning sugar level when occurs.  No chest pain.  No increased heart rate or palpitations.  No acid reflux.  No abdominal pain.  Bowels moving.  No urine change.  States when she is lying down, will notice jerking sensation.  Joints popping.  Right wrist snaps.  Has an appt scheduled with rheumatologist at [facility] given persistent joint issues.  Does have some anxiety related to the above symptoms - just the fact of not knowing the etiology.  Actually feels  her anxiety and worry is better.  Seeing psychiatry.  On zoloft and trazodone.     ROS: See pertinent positives and negatives per HPI.  Past Medical History:  Diagnosis Date  . Gestational diabetes     Past Surgical History:  Procedure Laterality Date  . APPENDECTOMY      Family History  Problem Relation Age of Onset  . Hypertension Mother   . Hypertension Father     SOCIAL HX: reviewed.    Current Outpatient Medications:  .  pregabalin (LYRICA) 50 MG capsule, Take 50 mg by mouth 2 (two) times daily., Disp: , Rfl:  .  sertraline (ZOLOFT) 100 MG tablet, Take 100 mg by mouth daily., Disp: , Rfl:  .  DULoxetine (CYMBALTA) 60 MG capsule, Take 60 mg by mouth daily., Disp: , Rfl:  .  Prenatal Multivit-Min-Fe-FA (PRENATAL VITAMINS PO), Take by mouth., Disp: , Rfl:   EXAM:  GENERAL: alert, oriented, appears well and in no acute distress  HEENT: atraumatic, conjunttiva clear, no obvious abnormalities on inspection of external nose and ears  NECK: normal movements of the head and neck  LUNGS: on inspection no signs of respiratory distress, breathing rate appears normal, no obvious gross SOB, gasping or wheezing  CV: no obvious cyanosis  PSYCH/NEURO: pleasant and cooperative, no obvious depression or anxiety, speech and thought processing grossly intact  ASSESSMENT AND PLAN:  Discussed the following assessment and plan:  Anxiety Seeing psychiatry.  On zoloft and trazodone.  Overall feels anxiety is better.  Follow.    Coccygeal pain Some persistent  pain.  Hurts when she goes from sitting to standing.  Planning to f/u with rheumatology next month.    History of gestational diabetes History of gestational diabetes.  Question if the episodes of feeling weak are related to sugar dropping.  Informed her she can come and get a glucometer.  Check sugar when occurs.  Follow.    Muscle ache Unclear etiology.  Has had extensive w/up.  Has seen Dr Sherryll BurgerShah.  Saw Dr Ron ParkerMorganlander.  Negative EMG/nerve conduction studies.  MRI thoracic spine - mild DDD. Planning to see rheumatology next month.    Sweating abnormality Some increased sweats.  Labs unrevealing.  Check sugar as outlined.    Weakness Check blood pressure and sugar with episodes as outlined.  W/up so far unrevealing.  Discussed with pt.      I discussed the assessment and treatment plan with the patient. The patient was provided an opportunity to ask questions and all were answered. The patient agreed with the plan and demonstrated an understanding of the instructions.   The patient was advised to call back or seek an in-person evaluation if the symptoms worsen or if the condition fails to improve as anticipated.   Dale Durhamharlene Storey Stangeland, MD

## 2019-06-19 NOTE — Telephone Encounter (Signed)
She is coming by to pick up the glucometer 06/20/19.

## 2019-06-20 NOTE — Telephone Encounter (Signed)
Script written for signature

## 2019-06-20 NOTE — Telephone Encounter (Signed)
Pt has picked up glucometer and prescription

## 2019-06-22 ENCOUNTER — Encounter: Payer: Self-pay | Admitting: Internal Medicine

## 2019-06-22 DIAGNOSIS — R531 Weakness: Secondary | ICD-10-CM | POA: Insufficient documentation

## 2019-06-22 NOTE — Assessment & Plan Note (Signed)
History of gestational diabetes.  Question if the episodes of feeling weak are related to sugar dropping.  Informed her she can come and get a glucometer.  Check sugar when occurs.  Follow.

## 2019-06-22 NOTE — Assessment & Plan Note (Signed)
Unclear etiology.  Has had extensive w/up.  Has seen Dr Manuella Ghazi.  Saw Dr Delsa Sale. Negative EMG/nerve conduction studies.  MRI thoracic spine - mild DDD. Planning to see rheumatology next month.

## 2019-06-22 NOTE — Assessment & Plan Note (Signed)
Some persistent pain.  Hurts when she goes from sitting to standing.  Planning to f/u with rheumatology next month.

## 2019-06-22 NOTE — Assessment & Plan Note (Signed)
Check blood pressure and sugar with episodes as outlined.  W/up so far unrevealing.  Discussed with pt.

## 2019-06-22 NOTE — Assessment & Plan Note (Signed)
Seeing psychiatry.  On zoloft and trazodone.  Overall feels anxiety is better.  Follow.

## 2019-06-22 NOTE — Assessment & Plan Note (Signed)
Some increased sweats.  Labs unrevealing.  Check sugar as outlined.

## 2019-06-23 NOTE — Telephone Encounter (Signed)
Pt called to ensure Dr Nicki Reaper sees her readings from this weekend. Pt states her blood sugar was 60 last night, and 98 this morning 11:30 after eating a lot.  Pt states this am she felt awful, weak.

## 2019-06-24 NOTE — Telephone Encounter (Signed)
order placed for endo referral.

## 2019-06-25 ENCOUNTER — Ambulatory Visit: Payer: Self-pay | Admitting: *Deleted

## 2019-06-25 ENCOUNTER — Other Ambulatory Visit: Payer: Self-pay

## 2019-06-25 ENCOUNTER — Ambulatory Visit (HOSPITAL_COMMUNITY)
Admission: EM | Admit: 2019-06-25 | Discharge: 2019-06-25 | Disposition: A | Discharge status: Home / Self Care | Payer: Self-pay | Attending: Family Medicine | Admitting: Family Medicine

## 2019-06-25 ENCOUNTER — Encounter (HOSPITAL_COMMUNITY): Payer: Self-pay

## 2019-06-25 DIAGNOSIS — R531 Weakness: Secondary | ICD-10-CM | POA: Insufficient documentation

## 2019-06-25 LAB — COMPREHENSIVE METABOLIC PANEL
ALT: 16 U/L (ref 0–44)
AST: 17 U/L (ref 15–41)
Albumin: 4.4 g/dL (ref 3.5–5.0)
Alkaline Phosphatase: 34 U/L — ABNORMAL LOW (ref 38–126)
Anion gap: 10 (ref 5–15)
BUN: 14 mg/dL (ref 6–20)
CO2: 25 mmol/L (ref 22–32)
Calcium: 9.7 mg/dL (ref 8.9–10.3)
Chloride: 104 mmol/L (ref 98–111)
Creatinine, Ser: 0.73 mg/dL (ref 0.44–1.00)
GFR calc Af Amer: >60 mL/min (ref >60)
GFR calc non Af Amer: >60 mL/min (ref >60)
Glucose, Bld: 98 mg/dL (ref 70–99)
Potassium: 4 mmol/L (ref 3.5–5.1)
Sodium: 139 mmol/L (ref 135–145)
Total Bilirubin: 0.5 mg/dL (ref 0.3–1.2)
Total Protein: 7.4 g/dL (ref 6.5–8.1)

## 2019-06-25 LAB — CBC
HCT: 42.1 % (ref 36.0–46.0)
Hemoglobin: 13.9 g/dL (ref 12.0–15.0)
MCH: 28.7 pg (ref 26.0–34.0)
MCHC: 33 g/dL (ref 30.0–36.0)
MCV: 87 fL (ref 80.0–100.0)
Platelets: 309 10*3/uL (ref 150–400)
RBC: 4.84 MIL/uL (ref 3.87–5.11)
RDW: 13.6 % (ref 11.5–15.5)
WBC: 9.9 10*3/uL (ref 4.0–10.5)
nRBC: 0 % (ref 0.0–0.2)

## 2019-06-25 NOTE — ED Triage Notes (Signed)
Pt presents to UC w/ c/o feeling general weakness since last December and has progressively gotten worse over past few days. Pt reports feeling nauseous last night. States feeling shakey, fingertips at times become white for short time and turn back to pink. Some tingling noted during those periods.

## 2019-06-25 NOTE — ED Provider Notes (Signed)
[facility]    CSN: [id number] Arrival date & time: 06/25/19  1839      History   Chief Complaint Chief Complaint  Patient presents with  . Weakness    HPI Tonya Harris is a 38 y.o. female.   Patient has history of weakness.  She has been monitoring blood sugars and is seen sugars on several occasions below 60.  She also has some shortness of breath with exertion.  She has been seen by a neurologist who has her on Zoloft and Lyrica with possible diagnosis of fibromyalgia or chronic fatigue syndrome.  She has appointment with endocrinology as far as hypoglycemia in the future.  There is also history of possible Raynaud's syndrome  HPI  Past Medical History:  Diagnosis Date  . Gestational diabetes     Patient Active Problem List   Diagnosis Date Noted  . Weakness 06/22/2019  . Coccygeal pain 04/12/2019  . Abdominal pain 12/22/2018  . Healthcare maintenance 12/19/2018  . Sweating abnormality 09/08/2018  . History of gestational diabetes 09/08/2018  . Anxiety 09/08/2018  . Muscle ache 09/02/2018  . Muscle twitching 09/02/2018  . ANAL PRURITUS 09/27/2009    Past Surgical History:  Procedure Laterality Date  . APPENDECTOMY      OB History   No obstetric history on file.      Home Medications    Prior to Admission medications   Medication Sig Start Date End Date Taking? Authorizing Provider  pregabalin (LYRICA) 50 MG capsule Take 50 mg by mouth 2 (two) times daily.   Yes [provider]  Prenatal Multivit-Min-Fe-FA (PRENATAL VITAMINS PO) Take by mouth.   Yes [provider]  sertraline (ZOLOFT) 100 MG tablet Take 100 mg by mouth daily.   Yes [provider]  DULoxetine (CYMBALTA) 60 MG capsule Take 60 mg by mouth daily.    [provider]    Family History Family History  Problem Relation Age of Onset  . Hypertension Mother   . Hypertension Father     Social History Social History   Tobacco Use  .  Smoking status: Never Smoker  . Smokeless tobacco: Never Used  Substance Use Topics  . Alcohol use: Not Currently  . Drug use: Not Currently     Allergies   Patient has no known allergies.   Review of Systems Review of Systems  Constitutional: Positive for fatigue.  HENT: Negative.   Respiratory: Positive for shortness of breath.   Cardiovascular: Negative.   Gastrointestinal: Negative.   Musculoskeletal: Positive for myalgias.  Neurological: Positive for weakness.  Psychiatric/Behavioral: The patient is nervous/anxious.      Physical Exam Triage Vital Signs ED Triage Vitals  Enc Vitals Group     BP 06/25/19 1857 (!) 143/83     Pulse Rate 06/25/19 1857 86     Resp 06/25/19 1857 18     Temp 06/25/19 1857 97.9 F (36.6 C)     Temp Source 06/25/19 1857 Oral     SpO2 06/25/19 1857 100 %     Weight --      Height --      Head Circumference --      Peak Flow --      Pain Score 06/25/19 1858 4     Pain Loc --      Pain Edu? --      Excl. in GC? --    No data found.  Updated Vital Signs BP (!) 143/83 (BP Location: Right  Arm)   Pulse 86   Temp 97.9 F (36.6 C) (Oral)   Resp 18   LMP 06/04/2019   SpO2 100%   Visual Acuity Right Eye Distance:   Left Eye Distance:   Bilateral Distance:    Right Eye Near:   Left Eye Near:    Bilateral Near:     Physical Exam Vitals signs and nursing note reviewed.  Constitutional:      Appearance: Normal appearance. She is normal weight.  HENT:     Head: Normocephalic.  Cardiovascular:     Rate and Rhythm: Normal rate and regular rhythm.     Pulses: Normal pulses.     Heart sounds: Normal heart sounds.  Pulmonary:     Effort: Pulmonary effort is normal.     Breath sounds: Normal breath sounds.  Abdominal:     General: Abdomen is flat.     Palpations: Abdomen is soft.  Neurological:     General: No focal deficit present.     Mental Status: She is alert and oriented to person, place, and time.  Psychiatric:         Mood and Affect: Mood normal.        Behavior: Behavior normal.      UC Treatments / Results  Labs (all labs ordered are listed, but only abnormal results are displayed) Labs Reviewed  CBC  COMPREHENSIVE METABOLIC PANEL    EKG   Radiology No results found.  Procedures Procedures (including critical care time)  Medications Ordered in UC Medications - No data to display  Initial Impression / Assessment and Plan / UC Course  I have reviewed the triage vital signs and the nursing notes.  Pertinent labs & imaging results that were available during my care of the patient were reviewed by me and considered in my medical decision making (see chart for details).    Weakness of uncertain etiology.  Would want to rule out height reactive hypoglycemia given her low blood sugars at home.  Also consider some heart problem with dyspnea on exertion but her heart and lung exam is within normal limits.  We will do some baseline labs today  Final Clinical Impressions(s) / UC Diagnoses   Final diagnoses:  None   Discharge Instructions   None    ED Prescriptions    None     Controlled Substance Prescriptions [place] Controlled Substance Registry consulted? No   Wardell Honour, MD 06/25/19 1945

## 2019-06-25 NOTE — Telephone Encounter (Signed)
Spoke with patient. She is going to urgent care this afternoon. Appt for tomorrow cancelled.

## 2019-06-25 NOTE — Telephone Encounter (Signed)
These symptoms are different then the symptoms that I have been discussing with her.  She has seen multiple specialists, but for different symptoms.  If she is now having chest tightness and sob, I recommend evaluation today to confirm nothing more acute going on - i.e., covid exposure?, etc.  We can f/u after, but do feel needs to be seen today.  Let her know that I am not in the office this pm.

## 2019-06-25 NOTE — Telephone Encounter (Signed)
Pt calling with complaints of SOB and chest tightness that has increased in the past week. Pt states she has experienced symptoms off and on for the past couple of months. Pt also has complaints of the tips of her nails feeling cold and being red. Pt states she has been seen by a neurologist as well and had multiple test done but symptoms are not improving. Pt states she feels SOB with walking up steps, bending over and states that it is hard for her to make up her bed. Pt in no distress at the time of call.Call transferred to Clydie BraunKaren in the office for the pt to be scheduled for an appt.   Reason for Disposition . [1] MILD difficulty breathing (e.g., minimal/no SOB at rest, SOB with walking, pulse <100) AND [2] NEW-onset or WORSE than normal  Answer Assessment - Initial Assessment Questions 1. LOCATION: "Where does it hurt?"       Middle of chest 2. RADIATION: "Does the pain go anywhere else?" (e.g., into neck, jaw, arms, back)     I don't really think so 3. ONSET: "When did the chest pain begin?" (Minutes, hours or days)      Past couple of months but has worsened within the past  4. PATTERN "Does the pain come and go, or has it been constant since it started?"  "Does it get worse with exertion?"      Comes and goes, happens all the time with walking up the steps 5. DURATION: "How long does it last" (e.g., seconds, minutes, hours)     It can be for a while after walking up the steps at least mins 6. SEVERITY: "How bad is the pain?"  (e.g., Scale 1-10; mild, moderate, or severe)    - MILD (1-3): doesn't interfere with normal activities     - MODERATE (4-7): interferes with normal activities or awakens from sleep    - SEVERE (8-10): excruciating pain, unable to do any normal activities       5 7. CARDIAC RISK FACTORS: "Do you have any history of heart problems or risk factors for heart disease?" (e.g., angina, prior heart attack; diabetes, high blood pressure, high cholesterol, smoker, or strong  family history of heart disease)     No 8. PULMONARY RISK FACTORS: "Do you have any history of lung disease?"  (e.g., blood clots in lung, asthma, emphysema, birth control pills)     No 9. CAUSE: "What do you think is causing the chest pain?"     Unsure but feels like it may be something neurological 10. OTHER SYMPTOMS: "Do you have any other symptoms?" (e.g., dizziness, nausea, vomiting, sweating, fever, difficulty breathing, cough)       Shortness of breath, a little bit of a cough, no nausea at this time but felt it the other night 11. PREGNANCY: "Is there any chance you are pregnant?" "When was your last menstrual period?"       No pregnancy last cycle 3 weeks ago  Answer Assessment - Initial Assessment Questions 1. RESPIRATORY STATUS: "Describe your breathing?" (e.g., wheezing, shortness of breath, unable to speak, severe coughing)      Shortness of breath 2. ONSET: "When did this breathing problem begin?"      A couple of months ago  3. PATTERN "Does the difficult breathing come and go, or has it been constant since it started?"      Comes and goes 4. SEVERITY: "How bad is your breathing?" (e.g., mild, moderate, severe)    -  MILD: No SOB at rest, mild SOB with walking, speaks normally in sentences, can lay down, no retractions, pulse < 100.    - MODERATE: SOB at rest, SOB with minimal exertion and prefers to sit, cannot lie down flat, speaks in phrases, mild retractions, audible wheezing, pulse 100-120.    - SEVERE: Very SOB at rest, speaks in single words, struggling to breathe, sitting hunched forward, retractions, pulse > 120      mild 5. RECURRENT SYMPTOM: "Have you had difficulty breathing before?" If so, ask: "When was the last time?" and "What happened that time?"      A couple of months ago but has worsened this past 6. CARDIAC HISTORY: "Do you have any history of heart disease?" (e.g., heart attack, angina, bypass surgery, angioplasty)       7. LUNG HISTORY: "Do you have any  history of lung disease?"  (e.g., pulmonary embolus, asthma, emphysema)     no 8. CAUSE: "What do you think is causing the breathing problem?"      unsure 9. OTHER SYMPTOMS: "Do you have any other symptoms? (e.g., dizziness, runny nose, cough, chest pain, fever)     Sometimes feels dizzy with bending over at times 10. PREGNANCY: "Is there any chance you are pregnant?" "When was your last menstrual period?"       n/a 11. TRAVEL: "Have you traveled out of the country in the last month?" (e.g., travel history, exposures)       No recent travel  Protocols used: BREATHING DIFFICULTY-A-AH, CHEST PAIN-A-AH

## 2019-06-25 NOTE — Discharge Instructions (Signed)
Follow-up with primary care physician and or endocrinologist

## 2019-06-25 NOTE — Telephone Encounter (Signed)
Patient scheduled with PCP for 06/26/19 are you ok with this.

## 2019-06-25 NOTE — Telephone Encounter (Signed)
Tried to reach patient by phone no answer left voicemail to call office [place] nurse may advise.

## 2019-06-26 ENCOUNTER — Telehealth (HOSPITAL_COMMUNITY): Payer: Self-pay | Admitting: Emergency Medicine

## 2019-06-26 ENCOUNTER — Encounter: Payer: Self-pay | Admitting: Internal Medicine

## 2019-06-26 ENCOUNTER — Ambulatory Visit: Payer: Self-pay | Admitting: Internal Medicine

## 2019-06-26 NOTE — Telephone Encounter (Signed)
Please schedule pt for a doxy appt Friday 06/27/19.  Thanks

## 2019-06-26 NOTE — Telephone Encounter (Signed)
Labs unremarkable. Patient contacted and made aware of    results, all questions answered   

## 2019-06-27 ENCOUNTER — Ambulatory Visit (INDEPENDENT_AMBULATORY_CARE_PROVIDER_SITE_OTHER): Payer: Self-pay | Admitting: Internal Medicine

## 2019-06-27 ENCOUNTER — Encounter: Payer: Self-pay | Admitting: Internal Medicine

## 2019-06-27 ENCOUNTER — Other Ambulatory Visit: Payer: Self-pay

## 2019-06-27 DIAGNOSIS — R0602 Shortness of breath: Secondary | ICD-10-CM

## 2019-06-27 DIAGNOSIS — R0609 Other forms of dyspnea: Secondary | ICD-10-CM

## 2019-06-27 DIAGNOSIS — R531 Weakness: Secondary | ICD-10-CM

## 2019-06-27 DIAGNOSIS — R0600 Dyspnea, unspecified: Secondary | ICD-10-CM

## 2019-06-27 DIAGNOSIS — Z20822 Contact with and (suspected) exposure to covid-19: Secondary | ICD-10-CM

## 2019-06-27 NOTE — Progress Notes (Addendum)
Patient ID: Tonya Harris, female   DOB: 1981-02-26, 38 y.o.   MRN: [id number]   Virtual Visit via video Note  This visit type was conducted due to national recommendations for restrictions regarding the COVID-19 pandemic (e.g. social distancing).  This format is felt to be most appropriate for this patient at this time.  All issues noted in this document were discussed and addressed.  No physical exam was performed (except for noted visual exam findings with Video Visits).   I connected with Tonya Harris by a video enabled telemedicine application and verified that I am speaking with the correct person using two identifiers. Location patient: home Location provider: work  Persons participating in the virtual visit: patient, provider  I discussed the limitations, risks, security and privacy concerns of performing an evaluation and management service by telephone and the availability of in person appointments. The patient expressed understanding and agreed to proceed.   Reason for visit: work in appt  HPI: Has had extensive w/up for muscle pain, muscle twitching and cramps.  [facility]  Followed by Dr Sherryll BurgerShah here.  On lyrica.  EMG/nerve conduction study - normal.  I evaluated her on 06/19/19 for f/u of these issues and for "weak" spells.  Was given a glucometer.  Did have some episodes of hypoglycemia with these "spells".  Does not appear to be orthostatic.  Has f/u planned with endocrinology to help with w/up of hypoglycemia.  Was instructed to eat protein and frequent snacks.  She was seen at Urgent Care 06/25/19 for what she described as DOE.  Note reviewed. Labs unrevealing.  On questioning her, she reports that she has noticed sob with exertion.  Notices when she carries her son up the stairs.  Some chest pain and tightness in her chest.  Notices with exertion, like emptying the dishwasher, etc.  Will also notice if she lies on her left side.  No fever.  No cough or chest  congestion.  Some sob with exertion.  Eating.  No abdominal pain.  Bowels moving.     ROS: See pertinent positives and negatives per HPI.  Past Medical History:  Diagnosis Date  . Gestational diabetes     Past Surgical History:  Procedure Laterality Date  . APPENDECTOMY      Family History  Problem Relation Age of Onset  . Hypertension Mother   . Hypertension Father     SOCIAL HX: reviewed.    Current Outpatient Medications:  .  DULoxetine (CYMBALTA) 60 MG capsule, Take 60 mg by mouth daily., Disp: , Rfl:  .  pregabalin (LYRICA) 50 MG capsule, Take 50 mg by mouth 2 (two) times daily., Disp: , Rfl:  .  Prenatal Multivit-Min-Fe-FA (PRENATAL VITAMINS PO), Take by mouth., Disp: , Rfl:  .  sertraline (ZOLOFT) 100 MG tablet, Take 100 mg by mouth daily., Disp: , Rfl:   EXAM:  GENERAL: alert, oriented, appears well and in no acute distress  HEENT: atraumatic, conjunttiva clear, no obvious abnormalities on inspection of external nose and ears  NECK: normal movements of the head and neck  LUNGS: on inspection no signs of respiratory distress, breathing rate appears normal, no obvious gross SOB, gasping or wheezing  CV: no obvious cyanosis  PSYCH/NEURO: pleasant and cooperative, no obvious depression or anxiety, speech and thought processing grossly intact  ASSESSMENT AND PLAN:  Discussed the following assessment and plan:  DOE (dyspnea on exertion) Describes increased sob with exertion.  On discussion, it does not appear  she is having acute onset of sob.  Does feel the symptoms have worsened recently.  No fever.  No chest congestion or cough.  Doubt covid given symptoms for weeks, but have worsened recently.  Will check nasal swab for covid.  Discussed importance of eating regular meals and staying hydrated.  Protein snacks.  Refer to cardiology to confirm if any further cardiac testing warranted (i.e., echo, etc).  She is aware if symptoms change or worsen, needs to be  evaluated.    Weakness Describes the weak spells as outlined.  Does not appear to be orthostatic.  Has had some episodes of low sugars associated with the "spells".  It appears the sob with exertion is associated with the weakness, but she has noticed more recently.  Feels has worsened.  Discussed further w/up.  Doubt covid.  Will swab.  Discussed with cardiology.  Plan for further cardiac evaluation to confirm no cardiac etiology.  Pt in agreement.      I discussed the assessment and treatment plan with the patient. The patient was provided an opportunity to ask questions and all were answered. The patient agreed with the plan and demonstrated an understanding of the instructions.   The patient was advised to call back or seek an in-person evaluation if the symptoms worsen or if the condition fails to improve as anticipated.   Einar Pheasant, MD

## 2019-06-27 NOTE — Telephone Encounter (Signed)
Pt scheduled at 12:00

## 2019-06-28 ENCOUNTER — Encounter: Payer: Self-pay | Admitting: Internal Medicine

## 2019-06-28 LAB — NOVEL CORONAVIRUS, NAA: SARS-CoV-2, NAA: NOT DETECTED

## 2019-06-30 ENCOUNTER — Encounter: Payer: Self-pay | Admitting: Internal Medicine

## 2019-06-30 DIAGNOSIS — R0602 Shortness of breath: Secondary | ICD-10-CM | POA: Insufficient documentation

## 2019-06-30 NOTE — Assessment & Plan Note (Addendum)
Describes the weak spells as outlined.  Does not appear to be orthostatic.  Has had some episodes of low sugars associated with the "spells".  It appears the sob with exertion is associated with the weakness, but she has noticed more recently.  Feels has worsened.  Discussed further w/up.  Doubt covid.  Will swab.  Discussed with cardiology.  Plan for further cardiac evaluation to confirm no cardiac etiology.  Pt in agreement.

## 2019-06-30 NOTE — Assessment & Plan Note (Signed)
Describes increased sob with exertion.  On discussion, it does not appear she is having acute onset of sob.  Does feel the symptoms have worsened recently.  No fever.  No chest congestion or cough.  Doubt covid given symptoms for weeks, but have worsened recently.  Will check nasal swab for covid.  Discussed importance of eating regular meals and staying hydrated.  Protein snacks.  Refer to cardiology to confirm if any further cardiac testing warranted (i.e., echo, etc).  She is aware if symptoms change or worsen, needs to be evaluated.

## 2019-07-10 ENCOUNTER — Encounter: Payer: Self-pay | Admitting: Internal Medicine

## 2019-07-11 ENCOUNTER — Ambulatory Visit
Admission: RE | Admit: 2019-07-11 | Discharge: 2019-07-11 | Disposition: A | Discharge status: Home / Self Care | Payer: Self-pay | Source: Ambulatory Visit | Attending: Internal Medicine | Admitting: Internal Medicine

## 2019-07-11 ENCOUNTER — Other Ambulatory Visit: Payer: Self-pay

## 2019-07-11 ENCOUNTER — Encounter: Payer: Self-pay | Admitting: Internal Medicine

## 2019-07-11 ENCOUNTER — Ambulatory Visit (INDEPENDENT_AMBULATORY_CARE_PROVIDER_SITE_OTHER): Payer: Self-pay | Admitting: Internal Medicine

## 2019-07-11 VITALS — Ht 65.0 in | Wt 151.0 lb

## 2019-07-11 DIAGNOSIS — R05 Cough: Secondary | ICD-10-CM

## 2019-07-11 DIAGNOSIS — R0602 Shortness of breath: Secondary | ICD-10-CM | POA: Insufficient documentation

## 2019-07-11 DIAGNOSIS — R059 Cough, unspecified: Secondary | ICD-10-CM

## 2019-07-11 DIAGNOSIS — F419 Anxiety disorder, unspecified: Secondary | ICD-10-CM

## 2019-07-11 NOTE — Telephone Encounter (Signed)
Scheduled pt for a virtual visit this morning at 11:00am.

## 2019-07-11 NOTE — Telephone Encounter (Signed)
See office note.  Pt virtual visit today.

## 2019-07-11 NOTE — Progress Notes (Signed)
Patient ID: Tonya Harris, female   DOB: August 03, 1981, 38 y.o.   MRN: [id number]   Virtual Visit via video Note  This visit type was conducted due to national recommendations for restrictions regarding the COVID-19 pandemic (e.g. social distancing).  This format is felt to be most appropriate for this patient at this time.  All issues noted in this document were discussed and addressed.  No physical exam was performed (except for noted visual exam findings with Video Visits).   I connected with Tonya Harris today at  by a video enabled telemedicine application and verified that I am speaking with the correct person using two identifiers. Location patient: home Location provider: work Persons participating in the virtual visit: patient, provider  I discussed the limitations, risks, security and privacy concerns of performing an evaluation and management service and the availability of in person appointments. The patient expressed understanding and agreed to proceed.   Reason for visit: work in appt  HPI: Work in appt with concerns regarding persistent sob and some cough- clear phlegm.  She has recently seen cardiology for the sob.  Note reviewed.  Had stress echo - negative.  Also seeing endocrinology for low blood sugars.  W/up in progress.  She reports continued sob.  Has noticed over the last two days, trouble swallowing - mostly her saliva.  Able to eat.  Some dry cough. Occasionally clear phlegm.  No sinus pressure.  No drainage.  Does report acid reflux.  Some loose stool at times.  No abdominal pain.  Occasional nausea. Feels bloated at times.  One episode of emesis.  No fever.  Eating.     ROS: See pertinent positives and negatives per HPI.  Past Medical History:  Diagnosis Date  . Gestational diabetes     Past Surgical History:  Procedure Laterality Date  . APPENDECTOMY      Family History  Problem Relation Age of Onset  . Hypertension Mother   . Hypertension Father     SOCIAL HX: reviewed.    Current Outpatient Medications:  .  DULoxetine (CYMBALTA) 60 MG capsule, Take 60 mg by mouth daily., Disp: , Rfl:  .  Prenatal Multivit-Min-Fe-FA (PRENATAL VITAMINS PO), Take by mouth., Disp: , Rfl:  .  sertraline (ZOLOFT) 100 MG tablet, Take 100 mg by mouth daily., Disp: , Rfl:  .  pregabalin (LYRICA) 50 MG capsule, Take 50 mg by mouth 2 (two) times daily., Disp: , Rfl:   EXAM:  GENERAL: alert, oriented, appears well and in no acute distress  HEENT: atraumatic, conjunttiva clear, no obvious abnormalities on inspection of external nose and ears  NECK: normal movements of the head and neck  LUNGS: on inspection no signs of respiratory distress, breathing rate appears normal, no obvious gross SOB, gasping or wheezing  CV: no obvious cyanosis  PSYCH/NEURO: pleasant and cooperative, no obvious depression or anxiety, speech and thought processing grossly intact  ASSESSMENT AND PLAN:  Discussed the following assessment and plan:  Cough Describes the cough as outlined.  Some acid reflux.  Question if the acid reflux could be contributing to the cough, sob and issues with swallowing.  Treat acid reflux with pepcid.  Check cxr.  Further w/up pending results and response to pepcid.    Anxiety Seeing psychiatry.  On zoloft and trazodone.  Overall feels this controls her anxiety.  Follow.    SOB (shortness of breath) Recently evaluated by cardiology.  Had negative stress echo.  Treat acid reflux as outlined.  Check  cxr.      I discussed the assessment and treatment plan with the patient. The patient was provided an opportunity to ask questions and all were answered. The patient agreed with the plan and demonstrated an understanding of the instructions.   The patient was advised to call back or seek an in-person evaluation if the symptoms worsen or if the condition fails to improve as anticipated.   Dale Durhamharlene Ayianna Darnold, MD

## 2019-07-12 ENCOUNTER — Encounter: Payer: Self-pay | Admitting: Internal Medicine

## 2019-07-15 ENCOUNTER — Encounter: Payer: Self-pay | Admitting: Internal Medicine

## 2019-07-15 NOTE — Assessment & Plan Note (Signed)
Seeing psychiatry.  On zoloft and trazodone.  Overall feels this controls her anxiety.  Follow.

## 2019-07-15 NOTE — Assessment & Plan Note (Signed)
Recently evaluated by cardiology.  Had negative stress echo.  Treat acid reflux as outlined.  Check cxr.

## 2019-07-15 NOTE — Assessment & Plan Note (Signed)
Describes the cough as outlined.  Some acid reflux.  Question if the acid reflux could be contributing to the cough, sob and issues with swallowing.  Treat acid reflux with pepcid.  Check cxr.  Further w/up pending results and response to pepcid.

## 2019-07-16 ENCOUNTER — Institutional Professional Consult (permissible substitution): Payer: Self-pay | Admitting: Pulmonary Disease

## 2019-08-22 ENCOUNTER — Ambulatory Visit: Payer: Self-pay | Admitting: Internal Medicine

## 2019-09-02 ENCOUNTER — Other Ambulatory Visit: Payer: Self-pay

## 2019-09-04 ENCOUNTER — Ambulatory Visit (INDEPENDENT_AMBULATORY_CARE_PROVIDER_SITE_OTHER): Payer: Self-pay | Admitting: Internal Medicine

## 2019-09-04 ENCOUNTER — Other Ambulatory Visit: Payer: Self-pay

## 2019-09-04 DIAGNOSIS — F419 Anxiety disorder, unspecified: Secondary | ICD-10-CM

## 2019-09-04 DIAGNOSIS — R253 Fasciculation: Secondary | ICD-10-CM

## 2019-09-04 DIAGNOSIS — R0602 Shortness of breath: Secondary | ICD-10-CM

## 2019-09-04 DIAGNOSIS — E162 Hypoglycemia, unspecified: Secondary | ICD-10-CM

## 2019-09-04 NOTE — Progress Notes (Signed)
Patient ID: Tonya Harris, female   DOB: 12-Mar-1981, 38 y.o.   MRN: [id number]   Subjective:    Patient ID: Tonya CandleElizabeth K Dant, female    DOB: 12-Mar-1981, 38 y.o.   MRN: [id number]  HPI  Patient here for a scheduled follow up.  Has had extensive w/up for varied symptoms.  Has seen neurology and rheumatology.  Also evaluated by cardiology for sob.  W/up negative.  Seeing endocrinology for evaluation of hypoglycemia.  No further significant drops in sugars.  Seeing psychiatry.  Feels anxiety controlled.  Still with concerns.  Reports muscle pain/twitching.  Also reports fasciculations.  Had video of muscle fasciculations.  Discussed further w/up.  Discussed her questions regarding further testing with possible repeat EMGs/NCS.  Eating and drinking well.  No abdominal pain.  Bowels moving.     Past Medical History:  Diagnosis Date  . Gestational diabetes    Past Surgical History:  Procedure Laterality Date  . APPENDECTOMY     Family History  Problem Relation Age of Onset  . Hypertension Mother   . Hypertension Father    Social History   Socioeconomic History  . Marital status: Married    Spouse name: Not on file  . Number of children: Not on file  . Years of education: Not on file  . Highest education level: Not on file  Occupational History  . Not on file  Social Needs  . Financial resource strain: Not on file  . Food insecurity    Worry: Not on file    Inability: Not on file  . Transportation needs    Medical: Not on file    Non-medical: Not on file  Tobacco Use  . Smoking status: Never Smoker  . Smokeless tobacco: Never Used  Substance and Sexual Activity  . Alcohol use: Not Currently  . Drug use: Not Currently  . Sexual activity: Yes  Lifestyle  . Physical activity    Days per week: Not on file    Minutes per session: Not on file  . Stress: Not on file  Relationships  . Social Musicianconnections    Talks on phone: Not on file    Gets together: Not on file   Attends religious service: Not on file    Active member of club or organization: Not on file    Attends meetings of clubs or organizations: Not on file    Relationship status: Not on file  Other Topics Concern  . Not on file  Social History Narrative  . Not on file    Outpatient Encounter Medications as of 09/04/2019  Medication Sig  . DULoxetine (CYMBALTA) 60 MG capsule Take 60 mg by mouth daily.  . Prenatal Multivit-Min-Fe-FA (PRENATAL VITAMINS PO) Take by mouth.  . sertraline (ZOLOFT) 100 MG tablet Take 100 mg by mouth daily.  . [DISCONTINUED] pregabalin (LYRICA) 50 MG capsule Take 50 mg by mouth 2 (two) times daily.   No facility-administered encounter medications on file as of 09/04/2019.    Review of Systems  Constitutional: Negative for appetite change and unexpected weight change.  HENT: Negative for congestion and sinus pressure.   Respiratory: Negative for cough and chest tightness.   Cardiovascular: Negative for chest pain, palpitations and leg swelling.  Gastrointestinal: Negative for abdominal pain, diarrhea, nausea and vomiting.  Genitourinary: Negative for difficulty urinating and dysuria.  Musculoskeletal: Negative for joint swelling and myalgias.  Skin: Negative for color change and rash.  Neurological: Negative for dizziness, light-headedness and headaches.  Muscle twitching as outlined.  Muscle pain.    Psychiatric/Behavioral: Negative for agitation and dysphoric mood.       Objective:    Physical Exam Constitutional:      General: She is not in acute distress.    Appearance: Normal appearance.  HENT:     Head: Normocephalic and atraumatic.     Right Ear: External ear normal.     Left Ear: External ear normal.  Eyes:     General: No scleral icterus.       Right eye: No discharge.        Left eye: No discharge.     Conjunctiva/sclera: Conjunctivae normal.  Neck:     Musculoskeletal: Neck supple. No muscular tenderness.     Thyroid: No  thyromegaly.  Cardiovascular:     Rate and Rhythm: Normal rate and regular rhythm.  Pulmonary:     Effort: No respiratory distress.     Breath sounds: Normal breath sounds. No wheezing.  Abdominal:     General: Bowel sounds are normal.     Palpations: Abdomen is soft.     Tenderness: There is no abdominal tenderness.  Musculoskeletal:        General: No swelling or tenderness.     Comments: Muscle strength - 5/5 upper extremities and lower extremities.    Lymphadenopathy:     Cervical: No cervical adenopathy.  Skin:    Findings: No erythema or rash.  Neurological:     Mental Status: She is alert.  Psychiatric:        Mood and Affect: Mood normal.        Behavior: Behavior normal.     BP 112/80   Pulse 95   Temp 98 F (36.7 C)   Resp 16   Wt 160 lb (72.6 kg)   SpO2 98%   BMI 26.63 kg/m  Wt Readings from Last 3 Encounters:  09/04/19 160 lb (72.6 kg)  07/11/19 151 lb (68.5 kg)  12/19/18 151 lb 12.8 oz (68.9 kg)     Lab Results  Component Value Date   WBC 9.9 06/25/2019   HGB 13.9 06/25/2019   HCT 42.1 06/25/2019   PLT 309 06/25/2019   GLUCOSE 98 06/25/2019   ALT 16 06/25/2019   AST 17 06/25/2019   NA 139 06/25/2019   K 4.0 06/25/2019   CL 104 06/25/2019   CREATININE 0.73 06/25/2019   BUN 14 06/25/2019   CO2 25 06/25/2019   TSH 2.37 09/02/2018    Dg Chest 2 View  Result Date: 07/11/2019 CLINICAL DATA:  Cough and shortness of breath for several months. Chest pain. Nausea and vomiting. EXAM: CHEST - 2 VIEW COMPARISON:  None. FINDINGS: The heart size and mediastinal contours are within normal limits. Both lungs are clear. The visualized skeletal structures are unremarkable. IMPRESSION: Negative.  No active cardiopulmonary disease. Electronically Signed   By: Danae Orleans M.D.   On: 07/11/2019 17:05       Assessment & Plan:   Problem List Items Addressed This Visit    Anxiety    Followed by psychiatry.  Doing better on current regimen.         Hypoglycemia    Watching her diet.  No significant low sugars recently.  Follow.        Muscle twitching    Has previously seen Dr Sherryll Burger and Dr Ron Parker. Previous EMG unrevealing.  W/up so far unrevealing.  Discuss with Dr Sherryll Burger if repeat studies warranted especially given persistent  muscle twitching, fasciculation, etc.        SOB (shortness of breath)    Saw cardiology.  Negative stress test.  cxr negative.  Follow.            Einar Pheasant, MD

## 2019-09-07 ENCOUNTER — Encounter: Payer: Self-pay | Admitting: Internal Medicine

## 2019-09-07 DIAGNOSIS — E162 Hypoglycemia, unspecified: Secondary | ICD-10-CM | POA: Insufficient documentation

## 2019-09-07 NOTE — Assessment & Plan Note (Signed)
Has previously seen Dr Manuella Ghazi and Dr Delsa Sale. Previous EMG unrevealing.  W/up so far unrevealing.  Discuss with Dr Manuella Ghazi if repeat studies warranted especially given persistent muscle twitching, fasciculation, etc.

## 2019-09-07 NOTE — Assessment & Plan Note (Signed)
Saw cardiology.  Negative stress test.  cxr negative.  Follow.

## 2019-09-07 NOTE — Assessment & Plan Note (Signed)
Followed by psychiatry.  Doing better on current regimen.

## 2019-09-07 NOTE — Assessment & Plan Note (Signed)
Watching her diet.  No significant low sugars recently.  Follow.

## 2019-09-11 ENCOUNTER — Encounter: Payer: Self-pay | Admitting: Internal Medicine

## 2019-09-12 ENCOUNTER — Ambulatory Visit: Payer: Self-pay | Admitting: Internal Medicine

## 2019-09-26 ENCOUNTER — Ambulatory Visit (INDEPENDENT_AMBULATORY_CARE_PROVIDER_SITE_OTHER): Payer: Self-pay | Admitting: Internal Medicine

## 2019-09-26 DIAGNOSIS — R531 Weakness: Secondary | ICD-10-CM

## 2019-09-26 DIAGNOSIS — F419 Anxiety disorder, unspecified: Secondary | ICD-10-CM

## 2019-09-26 DIAGNOSIS — R253 Fasciculation: Secondary | ICD-10-CM

## 2019-09-26 NOTE — Progress Notes (Signed)
Patient ID: Tonya Harris, female   DOB: Mar 28, 1981, 38 y.o.   MRN: [id number]   Virtual Visit via video Note  This visit type was conducted due to national recommendations for restrictions regarding the COVID-19 pandemic (e.g. social distancing).  This format is felt to be most appropriate for this patient at this time.  All issues noted in this document were discussed and addressed.  No physical exam was performed (except for noted visual exam findings with Video Visits).   I connected with [name] by a video enabled telemedicine application and verified that I am speaking with the correct person using two identifiers. Location patient: home Location provider: work Persons participating in the virtual visit: patient, provider  I discussed the limitations, risks, security and privacy concerns of performing an evaluation and management service by video and the availability of in person appointments.  The patient expressed understanding and agreed to proceed.   Reason for visit: follow up appt  HPI: Has had extensive w/up for varied symptoms.  Has seen neurology and rheumatology.  Also evaluated by cardiology for sob.  W/up negative. Seeing endocrinology for hypoglycemia.  Has not had any significant drops in her sugar lately.  Seeing psychiatry.  Anxiety controlled on current medication regimen.  Tries to stay active.  Exercising.  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  Still repots fatigue.  Notices at times her arms feel weak and hands and thumbs feel weak when she holds them up.  States she had one occasion where her elbow locked.  She had to pull her arm down with her other arm.  Still noticing some twitching.  Some trouble swallowing.  Some redness under her nails.  Neck pain.  No chest pain.  No increased cough or congestion.  No acid reflux.  Eating.  No abdominal pain.   Bowels moving.  Just saw Dr Sherryll Burger.  Had repeat NCS.  Normal.     ROS: See pertinent positives and negatives per HPI.  Past Medical History:  Diagnosis Date  . Gestational diabetes     Past Surgical History:  Procedure Laterality Date  . APPENDECTOMY      Family History  Problem Relation Age of Onset  . Hypertension Mother   . Hypertension Father     SOCIAL HX: reviewed.    Current Outpatient Medications:  .  DULoxetine (CYMBALTA) 60 MG capsule, Take 60 mg by mouth daily., Disp: , Rfl:  .  Prenatal Multivit-Min-Fe-FA (PRENATAL VITAMINS PO), Take by mouth., Disp: , Rfl:  .  sertraline (ZOLOFT) 100 MG tablet, Take 100 mg by mouth daily., Disp: , Rfl:   EXAM:  GENERAL: alert, oriented, appears well and in no acute distress  HEENT: atraumatic, conjunttiva clear, no obvious abnormalities on inspection of external nose and ears  NECK: normal movements of the head and neck  LUNGS: on inspection no signs of respiratory distress, breathing rate appears normal, no obvious gross SOB, gasping or wheezing  CV: no obvious cyanosis  PSYCH/NEURO: pleasant and cooperative, no obvious depression or anxiety, speech and thought processing grossly intact  ASSESSMENT AND PLAN:  Discussed the following assessment and plan:  Weakness Describes weakness as outlined.  Notices when holds arms and hands up (for example, writing on chalkboard).  Recently evaluated by Dr Sherryll Burger.  NCS/EMG - normal.  Has seen neurology and rheumatology.  Will monitor symptoms for now.  Follow.    Muscle twitching Persistent intermittent muscle twitching.  Just reevaluated by Dr Sherryll Burger.  Repeat NCS normal.  Discussed  evaluation to date.  Mutually decided to follow for now.  Monitor symptoms.    Anxiety Followed by psychiatry.  Stable on current medications.     I discussed the assessment and treatment plan with the patient. The patient was provided an opportunity to ask questions and all were answered. The patient agreed with the plan and demonstrated an understanding of the instructions.   The patient was advised to call back or  seek an in-person evaluation if the symptoms worsen or if the condition fails to improve as anticipated.   Einar Pheasant, MD

## 2019-09-28 ENCOUNTER — Encounter: Payer: Self-pay | Admitting: Internal Medicine

## 2019-09-28 NOTE — Assessment & Plan Note (Signed)
Describes weakness as outlined.  Notices when holds arms and hands up (for example, writing on chalkboard).  Recently evaluated by Dr Manuella Ghazi.  NCS/EMG - normal.  Has seen neurology and rheumatology.  Will monitor symptoms for now.  Follow.

## 2019-09-28 NOTE — Assessment & Plan Note (Signed)
Persistent intermittent muscle twitching.  Just reevaluated by Dr Manuella Ghazi.  Repeat NCS normal.  Discussed evaluation to date.  Mutually decided to follow for now.  Monitor symptoms.

## 2019-09-28 NOTE — Assessment & Plan Note (Signed)
Followed by psychiatry.  Stable on current medications.

## 2019-10-15 ENCOUNTER — Ambulatory Visit: Payer: Self-pay | Admitting: Internal Medicine

## 2019-10-17 ENCOUNTER — Other Ambulatory Visit: Payer: Self-pay

## 2019-10-17 ENCOUNTER — Encounter: Payer: Self-pay | Admitting: Internal Medicine

## 2019-10-17 ENCOUNTER — Ambulatory Visit (INDEPENDENT_AMBULATORY_CARE_PROVIDER_SITE_OTHER): Payer: Self-pay | Admitting: Internal Medicine

## 2019-10-17 DIAGNOSIS — R002 Palpitations: Secondary | ICD-10-CM

## 2019-10-17 DIAGNOSIS — R253 Fasciculation: Secondary | ICD-10-CM

## 2019-10-17 DIAGNOSIS — F419 Anxiety disorder, unspecified: Secondary | ICD-10-CM

## 2019-10-17 DIAGNOSIS — R0602 Shortness of breath: Secondary | ICD-10-CM

## 2019-10-17 NOTE — Progress Notes (Signed)
Patient ID: Tonya Harris, female   DOB: 1981-05-24, 38 y.o.   MRN: [id number]   Virtual Visit via video Note  This visit type was conducted due to national recommendations for restrictions regarding the COVID-19 pandemic (e.g. social distancing).  This format is felt to be most appropriate for this patient at this time.  All issues noted in this document were discussed and addressed.  No physical exam was performed (except for noted visual exam findings with Video Visits).   I connected with Tonya Harris by a video enabled telemedicine application and verified that I am speaking with the correct person using two identifiers. Location patient: home Location provider: work Persons participating in the virtual visit: patient, provider  I discussed the limitations, risks, security and privacy concerns of performing an evaluation and management service by video and the availability of in person appointments. The patient expressed understanding and agreed to proceed.   Reason for visit:  Work in appt  HPI: Has been having increased palpitations.  Also has noticed being more sob with increased activity - walking up steps, etc. Having some light headedness and sometimes will feel as if she could faint.  Heart rate around 140 when going up stairs.  Was evaluated by cardiology.  Recent stress echo - negative for ischemia.   cxr - negative.  Holter monitor placed.  Do not have results.  Noticed the week after taking off the monitor, the palpitations seemed to be more frequent (and worse).  Worse in am and mid pm.  Also has noticed some "snapping" of joints with certain movements.  Some stabbing pains when she was jogging (legs).  Knees locking.  Still noticing the twitching when she goes to bed. Did see Dr Maryruth Bun recently.  Feels is stable.  No changes made in medication.     ROS: See pertinent positives and negatives per HPI.  Past Medical History:  Diagnosis Date  . Gestational diabetes      Past Surgical History:  Procedure Laterality Date  . APPENDECTOMY      Family History  Problem Relation Age of Onset  . Hypertension Mother   . Hypertension Father     SOCIAL HX: reviewed.    Current Outpatient Medications:  .  DULoxetine (CYMBALTA) 60 MG capsule, Take 60 mg by mouth daily., Disp: , Rfl:  .  Prenatal Multivit-Min-Fe-FA (PRENATAL VITAMINS PO), Take by mouth., Disp: , Rfl:  .  sertraline (ZOLOFT) 100 MG tablet, Take 100 mg by mouth daily., Disp: , Rfl:   EXAM:  GENERAL: alert, oriented, appears well and in no acute distress  HEENT: atraumatic, conjunttiva clear, no obvious abnormalities on inspection of external nose and ears  NECK: normal movements of the head and neck  LUNGS: on inspection no signs of respiratory distress, breathing rate appears normal, no obvious gross SOB, gasping or wheezing  CV: no obvious cyanosis  PSYCH/NEURO: pleasant and cooperative, no obvious depression or anxiety, speech and thought processing grossly intact  ASSESSMENT AND PLAN:  Discussed the following assessment and plan:  SOB (shortness of breath) SOB with exertion as outlined.  Associated with increased heart rate.  Recent stress test negative for ischemia.  Wore holter monitor.  Waiting for results.  Has noticed heart rate 140s with minimal exertion - walking up stairs.  Also some noticed of light headedness, etc.  See if can monitor blood pressure.  Discuss with cardiology regarding further evaluation and w/up.    Muscle twitching Is persistent.  Still unclear etiology.  Followed by Dr Manuella Ghazi.  Repeat NCS normal.  Follow.    Palpitations Increased heart rate and palpitations as outlined.  Had recent f/u visit with cardiology.  Labs have been unrevealing.  Stress echo negative for ischemia.  Just wore holter monitor.  Follow up with cardiology regarding results and further w/up.    Anxiety Followed by cardiology.  Just evaluated recently.  No changes made.  Stable.      I discussed the assessment and treatment plan with the patient. The patient was provided an opportunity to ask questions and all were answered. The patient agreed with the plan and demonstrated an understanding of the instructions.   The patient was advised to call back or seek an in-person evaluation if the symptoms worsen or if the condition fails to improve as anticipated.   Einar Pheasant, MD

## 2019-10-19 ENCOUNTER — Encounter: Payer: Self-pay | Admitting: Internal Medicine

## 2019-10-19 DIAGNOSIS — R002 Palpitations: Secondary | ICD-10-CM | POA: Insufficient documentation

## 2019-10-19 NOTE — Assessment & Plan Note (Signed)
Increased heart rate and palpitations as outlined.  Had recent f/u visit with cardiology.  Labs have been unrevealing.  Stress echo negative for ischemia.  Just wore holter monitor.  Follow up with cardiology regarding results and further w/up.

## 2019-10-19 NOTE — Assessment & Plan Note (Signed)
SOB with exertion as outlined.  Associated with increased heart rate.  Recent stress test negative for ischemia.  Wore holter monitor.  Waiting for results.  Has noticed heart rate 140s with minimal exertion - walking up stairs.  Also some noticed of light headedness, etc.  See if can monitor blood pressure.  Discuss with cardiology regarding further evaluation and w/up.

## 2019-10-19 NOTE — Assessment & Plan Note (Signed)
Is persistent.  Still unclear etiology.  Followed by Dr Manuella Ghazi.  Repeat NCS normal.  Follow.

## 2019-10-19 NOTE — Assessment & Plan Note (Signed)
Followed by cardiology.  Just evaluated recently.  No changes made.  Stable.

## 2019-10-20 ENCOUNTER — Emergency Department (HOSPITAL_COMMUNITY): Payer: Self-pay

## 2019-10-20 ENCOUNTER — Encounter (HOSPITAL_COMMUNITY): Payer: Self-pay | Admitting: Emergency Medicine

## 2019-10-20 ENCOUNTER — Other Ambulatory Visit: Payer: Self-pay

## 2019-10-20 ENCOUNTER — Encounter: Payer: Self-pay | Admitting: Internal Medicine

## 2019-10-20 ENCOUNTER — Emergency Department (HOSPITAL_COMMUNITY)
Admission: EM | Admit: 2019-10-20 | Discharge: 2019-10-20 | Disposition: A | Discharge status: Home / Self Care | Payer: Self-pay | Attending: Emergency Medicine | Admitting: Emergency Medicine

## 2019-10-20 DIAGNOSIS — R002 Palpitations: Secondary | ICD-10-CM | POA: Insufficient documentation

## 2019-10-20 DIAGNOSIS — Z5321 Procedure and treatment not carried out due to patient leaving prior to being seen by health care provider: Secondary | ICD-10-CM | POA: Insufficient documentation

## 2019-10-20 DIAGNOSIS — R0602 Shortness of breath: Secondary | ICD-10-CM | POA: Insufficient documentation

## 2019-10-20 LAB — TROPONIN I (HIGH SENSITIVITY): Troponin I (High Sensitivity): 2 ng/L (ref <18)

## 2019-10-20 LAB — BASIC METABOLIC PANEL
Anion gap: 8 (ref 5–15)
BUN: 6 mg/dL (ref 6–20)
CO2: 24 mmol/L (ref 22–32)
Calcium: 9.2 mg/dL (ref 8.9–10.3)
Chloride: 106 mmol/L (ref 98–111)
Creatinine, Ser: 0.79 mg/dL (ref 0.44–1.00)
GFR calc Af Amer: >60 mL/min (ref >60)
GFR calc non Af Amer: >60 mL/min (ref >60)
Glucose, Bld: 122 mg/dL — ABNORMAL HIGH (ref 70–99)
Potassium: 3.8 mmol/L (ref 3.5–5.1)
Sodium: 138 mmol/L (ref 135–145)

## 2019-10-20 LAB — CBC
HCT: 41.5 % (ref 36.0–46.0)
Hemoglobin: 13.1 g/dL (ref 12.0–15.0)
MCH: 28.3 pg (ref 26.0–34.0)
MCHC: 31.6 g/dL (ref 30.0–36.0)
MCV: 89.6 fL (ref 80.0–100.0)
Platelets: 311 10*3/uL (ref 150–400)
RBC: 4.63 MIL/uL (ref 3.87–5.11)
RDW: 13 % (ref 11.5–15.5)
WBC: 8.8 10*3/uL (ref 4.0–10.5)
nRBC: 0 % (ref 0.0–0.2)

## 2019-10-20 LAB — I-STAT BETA HCG BLOOD, ED (MC, WL, AP ONLY): I-stat hCG, quantitative: <5 m[IU]/mL (ref <5)

## 2019-10-20 MED ORDER — SODIUM CHLORIDE 0.9% FLUSH: 3.0000 mL | Freq: Once | INTRAVENOUS | Status: DC

## 2019-10-20 NOTE — ED Triage Notes (Signed)
Pt. Stated, I have episodes of palpitations and I get SOB to the point of making me nausea. Sometimes I just can't function. This started about a year ago.

## 2019-10-20 NOTE — Telephone Encounter (Signed)
Called pt. She is going to urgent care today for evaluation due to her sats dropping in the low 80s and she is having palpitations. F/u scheduled with Dr Nicki Reaper on Friday to discuss.

## 2019-10-20 NOTE — Telephone Encounter (Signed)
See me about this prior to calling pt.  Need to discuss with her regarding making sure pulse is correlating.  Also, if truly decreasing when she walks, would she be agreeable to see pulmonary for further evaluation - they can do other evaluation for the sob, etc.

## 2019-10-24 ENCOUNTER — Ambulatory Visit (INDEPENDENT_AMBULATORY_CARE_PROVIDER_SITE_OTHER): Payer: Self-pay | Admitting: Internal Medicine

## 2019-10-24 ENCOUNTER — Other Ambulatory Visit: Payer: Self-pay

## 2019-10-24 DIAGNOSIS — F419 Anxiety disorder, unspecified: Secondary | ICD-10-CM

## 2019-10-24 DIAGNOSIS — R531 Weakness: Secondary | ICD-10-CM

## 2019-10-24 DIAGNOSIS — R002 Palpitations: Secondary | ICD-10-CM

## 2019-10-24 DIAGNOSIS — R0602 Shortness of breath: Secondary | ICD-10-CM

## 2019-10-24 NOTE — Progress Notes (Signed)
Patient ID: Tonya Harris, female   DOB: [birth year], 38 y.o.   MRN: [id number]   Virtual Visit via video Note  This visit type was conducted due to national recommendations for restrictions regarding the COVID-19 pandemic (e.g. social distancing).  This format is felt to be most appropriate for this patient at this time.  All issues noted in this document were discussed and addressed.  No physical exam was performed (except for noted visual exam findings with Video Visits).   I connected with [name] by a video enabled telemedicine application and verified that I am speaking with the correct person using two identifiers. Location patient: home Location provider: work  Persons participating in the virtual visit: patient, provider  I discussed the limitations, risks, security and privacy concerns of performing an evaluation and management service by video and the availability of in person appointments.  The patient expressed understanding and agreed to proceed.   Reason for visit: work in appt  HPI: Recently evaluated by cardiology.  Had holter.  Holter revealed NSR (rate 51) with rare PVCs and PACs.  Recently noticed increased sob.  SOB with minimal exertion.  SOB with walking up stairs.  Noticed previously - hard to talk after walking up stairs.  Still has episodes of feeling weak and shaky.  No further episodes of sugar dropping.  Has noticed some dry mouth. No headache reported.  No chest pain.  Eating.  No vomiting.  Some persistent aching.  Has been on her feet a lot.  Feet and legs will hurt.  Blood pressure ok.     ROS: See pertinent positives and negatives per HPI.  Past Medical History:  Diagnosis Date  . Gestational diabetes     Past Surgical History:  Procedure Laterality Date  . APPENDECTOMY      Family History  Problem Relation Age of Onset  . Hypertension Mother   . Hypertension Father     SOCIAL HX: reviewed.    Current Outpatient Medications:  .   DULoxetine (CYMBALTA) 60 MG capsule, Take 60 mg by mouth daily., Disp: , Rfl:  .  Prenatal Multivit-Min-Fe-FA (PRENATAL VITAMINS PO), Take by mouth., Disp: , Rfl:  .  sertraline (ZOLOFT) 100 MG tablet, Take 100 mg by mouth daily., Disp: , Rfl:   EXAM:  VITALS per patient if applicable: 865/78, 469/62  GENERAL: alert, oriented, appears well and in no acute distress  HEENT: atraumatic, conjunttiva clear, no obvious abnormalities on inspection of external nose and ears  NECK: normal movements of the head and neck  LUNGS: on inspection no signs of respiratory distress, breathing rate appears normal, no obvious gross SOB, gasping or wheezing  CV: no obvious cyanosis  PSYCH/NEURO: pleasant and cooperative, no obvious depression or anxiety, speech and thought processing grossly intact  ASSESSMENT AND PLAN:  Discussed the following assessment and plan:  Palpitations Has had persistent problems.  Recently evaluated by cardiology.  Recent stress test negative for ischemia.  Holter as outlined.  Discussed with cardiology.  Planning for 21 day monitor.  Contacted cardiology and they will schedule.    SOB (shortness of breath) SOB with exertion.  Associated with increased heart rate.  Not sure if her previous measurement - correlated with her pulse.  Recent stress test negative.  cxr ok.  Will f/u with cardiology  - placement of 21 day monitor.    Anxiety Followed by psychiatry.  Stable.    Weakness Recently evaluated by Dr Manuella Ghazi.  NCS/EMG - normal. Pursue further  cardiac w/up as outlined.      I discussed the assessment and treatment plan with the patient. The patient was provided an opportunity to ask questions and all were answered. The patient agreed with the plan and demonstrated an understanding of the instructions.   The patient was advised to call back or seek an in-person evaluation if the symptoms worsen or if the condition fails to improve as anticipated.    Dale [place], MD

## 2019-11-01 ENCOUNTER — Encounter: Payer: Self-pay | Admitting: Internal Medicine

## 2019-11-01 NOTE — Assessment & Plan Note (Signed)
Recently evaluated by Dr Manuella Ghazi.  NCS/EMG - normal. Pursue further cardiac w/up as outlined.

## 2019-11-01 NOTE — Assessment & Plan Note (Signed)
Has had persistent problems.  Recently evaluated by cardiology.  Recent stress test negative for ischemia.  Holter as outlined.  Discussed with cardiology.  Planning for 21 day monitor.  Contacted cardiology and they will schedule.

## 2019-11-01 NOTE — Assessment & Plan Note (Signed)
SOB with exertion.  Associated with increased heart rate.  Not sure if her previous measurement - correlated with her pulse.  Recent stress test negative.  cxr ok.  Will f/u with cardiology  - placement of 21 day monitor.

## 2019-11-01 NOTE — Assessment & Plan Note (Signed)
Followed by psychiatry.  Stable.  

## 2019-11-19 ENCOUNTER — Ambulatory Visit: Payer: Self-pay | Admitting: Internal Medicine

## 2019-11-20 ENCOUNTER — Ambulatory Visit (INDEPENDENT_AMBULATORY_CARE_PROVIDER_SITE_OTHER): Payer: Self-pay | Admitting: Internal Medicine

## 2019-11-20 ENCOUNTER — Encounter: Payer: Self-pay | Admitting: Internal Medicine

## 2019-11-20 DIAGNOSIS — R531 Weakness: Secondary | ICD-10-CM

## 2019-11-20 DIAGNOSIS — F419 Anxiety disorder, unspecified: Secondary | ICD-10-CM

## 2019-11-20 DIAGNOSIS — R002 Palpitations: Secondary | ICD-10-CM

## 2019-11-20 DIAGNOSIS — R253 Fasciculation: Secondary | ICD-10-CM

## 2019-11-20 NOTE — Progress Notes (Signed)
Patient ID: Tonya Harris, female   DOB: [birth year], 39 y.o.   MRN: [id number]   Virtual Visit via video Note  This visit type was conducted due to national recommendations for restrictions regarding the COVID-19 pandemic (e.g. social distancing).  This format is felt to be most appropriate for this patient at this time.  All issues noted in this document were discussed and addressed.  No physical exam was performed (except for noted visual exam findings with Video Visits).   I connected with [name] by a video enabled telemedicine application and verified that I am speaking with the correct person using two identifiers. Location patient: home Location provider: work Persons participating in the virtual visit: patient, provider  The limitations, risks, security and privacy concerns of performing an evaluation and management service by telephone and the availability of in person appointments have been discussed. The patient expressed understanding and agreed to proceed.   Reason for visit: work in appt  HPI: Scheduled work in appt to discuss some continued concerns.  Has noticed some persistent msk symtoms.  States when dressing her child, noticed arms felt fatigue (worn out).  Also, when walking - legs hurt.  Wrist hurting.  Still with persistent twitching.  Not sleeping well.  Has seen neurology.  Had negative EMGs.  Is concerned about an underlying neuromuscular disorder  - that has not been picked up on testing.  Has noticed some difficulty swallowing.  Feels has to repeatedly swallow.  Has noticed some burping and stomach ache - at times.  Blood pressures varying.  Recently saw cardiology.  Has 21 day event monitor.  Due to f/u with cardiology for results.     ROS: See pertinent positives and negatives per HPI.  Past Medical History:  Diagnosis Date  . Gestational diabetes     Past Surgical History:  Procedure Laterality Date  . APPENDECTOMY      Family History    Problem Relation Age of Onset  . Hypertension Mother   . Hypertension Father     SOCIAL HX: reviewed.    Current Outpatient Medications:  .  DULoxetine (CYMBALTA) 60 MG capsule, Take 60 mg by mouth daily., Disp: , Rfl:  .  Prenatal Multivit-Min-Fe-FA (PRENATAL VITAMINS PO), Take by mouth., Disp: , Rfl:  .  sertraline (ZOLOFT) 100 MG tablet, Take 100 mg by mouth daily., Disp: , Rfl:   EXAM:  VITALS per patient if applicable: 329/92 (varying pressures)  GENERAL: alert, oriented, appears well and in no acute distress  HEENT: atraumatic, conjunttiva clear, no obvious abnormalities on inspection of external nose and ears  NECK: normal movements of the head and neck  LUNGS: on inspection no signs of respiratory distress, breathing rate appears normal, no obvious gross SOB, gasping or wheezing  CV: no obvious cyanosis  PSYCH/NEURO: pleasant and cooperative, no obvious depression or anxiety, speech and thought processing grossly intact  ASSESSMENT AND PLAN:  Discussed the following assessment and plan:  Weakness Recently evaluated by Dr Manuella Ghazi.  NCS/EMG - normal.  Concerned regarding persistent weakness (arms, legs) - with activity - walking, dressing her children, etc.  She is concerned that she has some underlying neuromuscular disorder that has not been diagnosed.  After discussion, she request referral to another neurologist "who specializes" in neuromuscular disorders.    Palpitations Saw cardiology.  Wearing a monitor - 21 day.  Continue f/u with cardiology.   Muscle twitching Persistent muscle twitching.  Request referral to neurology as outlined.    Anxiety  Increased recently.  Seeing psychiatry.  Has been stable on current regimen.  Follow.     Orders Placed This Encounter  Procedures  . Ambulatory referral to Neurology    Referral Priority:   Routine    Referral Type:   Consultation    Referral Reason:   Specialty Services Required    Requested Specialty:    Neurology    Number of Visits Requested:   1     I discussed the assessment and treatment plan with the patient. The patient was provided an opportunity to ask questions and all were answered. The patient agreed with the plan and demonstrated an understanding of the instructions.   The patient was advised to call back or seek an in-person evaluation if the symptoms worsen or if the condition fails to improve as anticipated.   Dale Lucien, MD

## 2019-11-21 ENCOUNTER — Ambulatory Visit: Payer: Self-pay | Admitting: Internal Medicine

## 2019-11-21 ENCOUNTER — Encounter: Payer: Self-pay | Admitting: Internal Medicine

## 2019-11-23 ENCOUNTER — Encounter: Payer: Self-pay | Admitting: Internal Medicine

## 2019-11-23 NOTE — Assessment & Plan Note (Signed)
Increased recently.  Seeing psychiatry.  Has been stable on current regimen.  Follow.

## 2019-11-23 NOTE — Assessment & Plan Note (Signed)
Saw cardiology.  Wearing a monitor - 21 day.  Continue f/u with cardiology.

## 2019-11-23 NOTE — Assessment & Plan Note (Signed)
Persistent muscle twitching.  Request referral to neurology as outlined.

## 2019-11-23 NOTE — Telephone Encounter (Signed)
Order placed for neurology referral.   

## 2019-11-23 NOTE — Assessment & Plan Note (Signed)
Recently evaluated by Dr Sherryll Burger.  NCS/EMG - normal.  Concerned regarding persistent weakness (arms, legs) - with activity - walking, dressing her children, etc.  She is concerned that she has some underlying neuromuscular disorder that has not been diagnosed.  After discussion, she request referral to another neurologist "who specializes" in neuromuscular disorders.

## 2019-11-24 ENCOUNTER — Other Ambulatory Visit: Payer: Self-pay | Admitting: Internal Medicine

## 2019-11-24 ENCOUNTER — Encounter: Payer: Self-pay | Admitting: Neurology

## 2019-11-24 DIAGNOSIS — R1310 Dysphagia, unspecified: Secondary | ICD-10-CM

## 2019-11-24 NOTE — Progress Notes (Signed)
Order placed for UGI and barium swallow.   

## 2019-11-28 ENCOUNTER — Encounter: Payer: Self-pay | Admitting: Neurology

## 2019-11-28 ENCOUNTER — Ambulatory Visit (INDEPENDENT_AMBULATORY_CARE_PROVIDER_SITE_OTHER): Payer: Self-pay | Admitting: Neurology

## 2019-11-28 ENCOUNTER — Other Ambulatory Visit: Payer: Self-pay

## 2019-11-28 VITALS — BP 119/74 | HR 94 | Ht 65.0 in | Wt 158.0 lb

## 2019-11-28 DIAGNOSIS — M791 Myalgia, unspecified site: Secondary | ICD-10-CM

## 2019-11-28 DIAGNOSIS — F418 Other specified anxiety disorders: Secondary | ICD-10-CM

## 2019-11-28 DIAGNOSIS — R253 Fasciculation: Secondary | ICD-10-CM

## 2019-11-28 NOTE — Progress Notes (Signed)
[facility] Clinic Note - Initial Visit   Date: 11/28/19  Tonya Harris MRN: [id number] DOB: Oct 30, 1981   Dear Dr. Nicki Reaper:  Thank you for your kind referral of Tonya Harris for consultation of muscle twitches and pain. Although her history is well known to you, please allow [place] to reiterate it for the purpose of our medical record. The patient was accompanied to the clinic by self.    History of Present Illness: Tonya Harris is a 39 y.o. right-handed female with anxiety, Raynaud's syndrome, and a multitude of symptoms referred for evaluation of muscle twitching, fatigue, and pain.  I am her fourth neurologist that she is seeing for these symptoms.  She has previously been evaluated at [facility](Dr. Lorre Nick), [facility] (Dr. Manuella Ghazi, [facility]), and [facility] (Dr. Hart Robinsons, [facility]).   She was under a great deal of personal stressors in 2019.  During this time, she began having various symptoms including generalized fatigue, weakness, muscle twitches.  She shares several videos of muscle twitches in the foot, shoulder, and hand, which appeared consistent with fasciculations.  She feels that her muscles are wasting in her hands and she has difficulty with doing tasks around the home, such as laundry.  She also complains neck weakness/pain, difficulty swallowing and food getting stuck in her throat, pain in the legs, and feeling as if her joints get stuck.  There is nothing that improves her symptoms.  She is very bothered and concerned that she has undiagnosed condition, despite normal electrodiagnostic testing x 2, MRI brain, MRI cervical spine, and MRI thoracic spine.  Serology testing including screening for inflammatory conditions, thyroid disease, CK, GAD, SPEP, and heavy metal screening was normal.  She had previously been on baclofen, gabapentin, and Lyrica with no improvement.  She is also followed by psychiatry, Dr. Jake Michaelis,  for anxiety.  She has also seen other specialists including cardiology, rheumatology, sports medicine, and endocrinology for other symptoms.  Out-side paper records, electronic medical record, and images have been reviewed where available and summarized as:  MRI thoracic spine wo contrast 10/26/2018:  Mild thoracic kyphosis with mild disc degeneration in the midthoracic spine as above. No disc protrusion or stenosis. No other signs of Scheuermann's disease.  MRI brain wwo contrast 07/14/2018:  Negative MRI the brain. No significant white matter disease is evident. No acute or focal lesion to explain the patient's acute symptoms.  MRI cervical spine wo contrast 08/02/2018 No focal disc herniation or significant stenosis. No pathologic enhancement. Normal cord signal.  NCS/EMG 12/26/2018:   This is a normal study. There is no evidence of peripheral nerve hyper-excitability, or motor neuron disease.     NCS/EMG of the right arm and leg 09/17/2019: This is a normal electrodiagnostic study. There is no electrodiagnostic evidence of generalized peripheral neuropathy.    Lab Results  Component Value Date   BTDVVOHY07 371 09/02/2018   Lab Results  Component Value Date   TSH 2.37 09/02/2018   Lab Results  Component Value Date   ESRSEDRATE 1 09/02/2018    Past Medical History:  Diagnosis Date  . Gestational diabetes     Past Surgical History:  Procedure Laterality Date  . APPENDECTOMY       Medications:  Outpatient Encounter Medications as of 11/28/2019  Medication Sig  . DULoxetine (CYMBALTA) 60 MG capsule Take 60 mg by mouth daily.  . Multiple Vitamin (MULTIVITAMIN) tablet Take 1 tablet by mouth daily.  . sertraline (ZOLOFT) 100 MG tablet  Take 100 mg by mouth daily.  . [DISCONTINUED] Prenatal Multivit-Min-Fe-FA (PRENATAL VITAMINS PO) Take by mouth.   No facility-administered encounter medications on file as of 11/28/2019.    Allergies: No Known Allergies  Family  History: Family History  Problem Relation Age of Onset  . Hypertension Mother   . Hypertension Father     Social History: Social History   Tobacco Use  . Smoking status: Never Smoker  . Smokeless tobacco: Never Used  Substance Use Topics  . Alcohol use: Not Currently  . 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  Speech is not dysarthric.  CRANIAL NERVES: II:  No visual field defects.   III-IV-VI: Pupils equal round.  Normal conjugate, extra-ocular eye movements in all directions of gaze.  No nystagmus.  No ptosis.   VII:  Normal facial symmetry and movements.  Orbicularis oris, orbicularis oculi, buccinator is 5/5 VIII:  Normal hearing and vestibular function.   IX-X:  Normal palatal movement.   XI:  Normal shoulder shrug and head rotation.   XII:  Normal tongue strength and range of motion, no deviation or fasciculation.  MOTOR:  No atrophy, fasciculations or abnormal movements. No muscle fatigability. Normal indentations caused by tendons in the hands are seen.   No pronator drift.  There is no grip myotonia.  No percussion myotonia.   Neck flexion and extension is 5/5 Upper Extremity:  Right  Left  Deltoid  5/5   5/5   Biceps  5/5   5/5   Triceps  5/5   5/5   Infraspinatus 5/5  5/5  Medial pectoralis 5/5  5/5  Wrist extensors  5/5   5/5   Wrist flexors  5/5   5/5   Finger extensors  5/5   5/5   Finger flexors  5/5   5/5   Dorsal interossei  5/5   5/5   Abductor pollicis  5/5   5/5   Tone (Ashworth scale)  0  0   Lower Extremity:  Right  Left   Hip flexors  5/5   5/5   Hip extensors  5/5   5/5   Adductor 5/5  5/5  Abductor 5/5  5/5  Knee flexors  5/5   5/5   Knee extensors  5/5   5/5   Dorsiflexors  5/5   5/5   Plantarflexors  5/5   5/5   Toe extensors  5/5   5/5   Toe flexors  5/5   5/5   Tone (Ashworth scale)  0  0   MSRs:  Right        Left                  brachioradialis 2+  2+  biceps 2+  2+  triceps 2+  2+  patellar 2+  2+  ankle jerk 2+  2+  Hoffman no  no  plantar response down  down  SENSORY:  Normal and symmetric perception of light touch, pinprick, vibration.    COORDINATION/GAIT: Normal finger-to- nose-finger.  Intact rapid alternating movements bilaterally. Able to perform deep knee squat and stand to rise without assistance.  Gait narrow based and stable. Tandem and stressed gait intact.    IMPRESSION: This is a 39 year-old female presenting with a myriad of symptoms including muscle twitches, pain, difficulty swallowing, weakness, and generalized fatigue.  Her neurological exam is entirely normal and non-focal.  Prior testing has been extensive and thorough including NCS/EMG x 2, MRI brain, MRI cervical spine, MRI thoracic spine, and serology testing which has been normal.  With a normal neurological exam and evaluation, I reassured patient that she does not have a primary neurological or neuromuscular condition.  Symptoms are most likely stemming from anxiety/stress/fibromyalgia.  Based on her videos, it appears that she also has benign fasciculations, which can be triggered by stress and in isolation, does not represent any worrisome pathology.  I discussed this at length with the patient, but I am not sure if she is willing to accept this.  No further neurological testing is indicated.  I encouraged her to work with her psychiatrist and counselor on coping strategies as she has significant anxiety regarding her overall health.   Thank you for allowing me to participate in patient's care.  If I can answer  any additional questions, I would be pleased to do so.    Sincerely,    Terryn Redner K. Posey Pronto, DO

## 2019-12-09 ENCOUNTER — Ambulatory Visit: Payer: Self-pay

## 2019-12-12 ENCOUNTER — Ambulatory Visit (INDEPENDENT_AMBULATORY_CARE_PROVIDER_SITE_OTHER): Payer: Self-pay | Admitting: Internal Medicine

## 2019-12-12 DIAGNOSIS — R531 Weakness: Secondary | ICD-10-CM

## 2019-12-12 DIAGNOSIS — F419 Anxiety disorder, unspecified: Secondary | ICD-10-CM

## 2019-12-12 DIAGNOSIS — R030 Elevated blood-pressure reading, without diagnosis of hypertension: Secondary | ICD-10-CM

## 2019-12-12 DIAGNOSIS — R253 Fasciculation: Secondary | ICD-10-CM

## 2019-12-12 DIAGNOSIS — R002 Palpitations: Secondary | ICD-10-CM

## 2019-12-12 NOTE — Progress Notes (Signed)
Patient ID: Tonya Harris, female   DOB: Feb 25, 1981, 39 y.o.   MRN: [id number]   Virtual Visit via vide Note  This visit type was conducted due to national recommendations for restrictions regarding the COVID-19 pandemic (e.g. social distancing).  This format is felt to be most appropriate for this patient at this time.  All issues noted in this document were discussed and addressed.  No physical exam was performed (except for noted visual exam findings with Video Visits).   I connected with Lanora Manis. Quaintance by a video enabled telemedicine application and verified that I am speaking with the correct person using two identifiers. Location patient: home Location provider: work  Persons participating in the virtual visit: patient, provider  The limitations, risks, security and privacy concerns of performing an evaluation and management service by video and the availability of in person appointments have been discussed.  The patient expressed understanding and agreed to proceed.   Reason for visit: follow up.   HPI: Was recently evaluated by neurology for another opinion regarding her muscle twitching, fatigue, weakness and swallowing problems.  Discussed and reviewed prior testing, including NCS/EMG x 2, MRI brain, MRI cervical spine, MRI thoracic spine and serology testing - normal.  Per neurology, felt to have benign fasciculations.  Felt no further neurological testing warranted at that time.  Discussed visit with her. Discussed continued symptoms.  Still having issues with swallowing.  Can't swallow saliva.  Still has some spells - will feel funny.  Blood pressure elevated intermittently.  Eating.  No vomiting.     ROS: See pertinent positives and negatives per HPI.  Past Medical History:  Diagnosis Date  . Gestational diabetes     Past Surgical History:  Procedure Laterality Date  . APPENDECTOMY      Family History  Problem Relation Age of Onset  . Hypertension Mother   .  Hypertension Father     SOCIAL HX: reviewed.    Current Outpatient Medications:  .  DULoxetine (CYMBALTA) 60 MG capsule, Take 60 mg by mouth daily., Disp: , Rfl:  .  Multiple Vitamin (MULTIVITAMIN) tablet, Take 1 tablet by mouth daily., Disp: , Rfl:  .  sertraline (ZOLOFT) 100 MG tablet, Take 100 mg by mouth daily., Disp: , Rfl:   EXAM:  GENERAL: alert, oriented, appears well and in no acute distress  HEENT: atraumatic, conjunttiva clear, no obvious abnormalities on inspection of external nose and ears  NECK: normal movements of the head and neck  LUNGS: on inspection no signs of respiratory distress, breathing rate appears normal, no obvious gross SOB, gasping or wheezing  CV: no obvious cyanosis  PSYCH/NEURO: pleasant and cooperative, no obvious depression or anxiety, speech and thought processing grossly intact  ASSESSMENT AND PLAN:  Discussed the following assessment and plan:  Weakness Recently evaluated by neurology as outlined.  No further w/up warranted at that time.  Discussed with her today. Discussed monitoring symptoms and hold on further testing. She was in agreement.    Palpitations Previous saw cardiology.  Wore monitor.  No significant arrhythmia.  Follow.    Muscle twitching Previous EMG/NCS x 2 normal.  Per neurology, felt to be benign fasciculations.  Felt no further w/up warranted.  Follow.    Anxiety Followed by psychiatry.  Feels stable.  Continue current medication regimen.   Elevated blood pressure reading Have her spot check her pressure.  Follow.  Hold on medication at this time.      I discussed the assessment and treatment  plan with the patient. The patient was provided an opportunity to ask questions and all were answered. The patient agreed with the plan and demonstrated an understanding of the instructions.   The patient was advised to call back or seek an in-person evaluation if the symptoms worsen or if the condition fails to improve as  anticipated.   Einar Pheasant, MD

## 2019-12-19 ENCOUNTER — Ambulatory Visit: Payer: Self-pay | Admitting: Neurology

## 2019-12-20 ENCOUNTER — Encounter: Payer: Self-pay | Admitting: Internal Medicine

## 2019-12-20 DIAGNOSIS — R030 Elevated blood-pressure reading, without diagnosis of hypertension: Secondary | ICD-10-CM | POA: Insufficient documentation

## 2019-12-20 NOTE — Assessment & Plan Note (Signed)
Have her spot check her pressure.  Follow.  Hold on medication at this time.

## 2019-12-20 NOTE — Assessment & Plan Note (Signed)
Previous saw cardiology.  Wore monitor.  No significant arrhythmia.  Follow.

## 2019-12-20 NOTE — Assessment & Plan Note (Signed)
Followed by psychiatry.  Feels stable.  Continue current medication regimen.

## 2019-12-20 NOTE — Assessment & Plan Note (Signed)
Previous EMG/NCS x 2 normal.  Per neurology, felt to be benign fasciculations.  Felt no further w/up warranted.  Follow.

## 2019-12-20 NOTE — Assessment & Plan Note (Signed)
Recently evaluated by neurology as outlined.  No further w/up warranted at that time.  Discussed with her today. Discussed monitoring symptoms and hold on further testing. She was in agreement.

## 2019-12-28 ENCOUNTER — Encounter: Payer: Self-pay | Admitting: Internal Medicine

## 2019-12-29 NOTE — Telephone Encounter (Signed)
See my other note on her for more info. She is aware that you are out of office until later in the week.

## 2019-12-29 NOTE — Telephone Encounter (Signed)
Confirmed with patient that she is doing ok and she is not having active SOB at this time. Same ongoing symptoms that you have been following for. Was wondering if you have any recommendations for her prior to her appt. Advised that you are not in the office until Wednesday and she will need to be evaluated if any significant acute changes. Patient understood and stated that its nothing really new or worsening she just can't figure out what is wrong with her.

## 2019-12-30 NOTE — Telephone Encounter (Signed)
I had discussed with her regarding an ambulatory blood pressure monitor.  See if Tonya Harris (or anyone there) knows how we can arrange for her to have an ambulatory blood pressure monitor. See me if questions about this.  (this is worn and we get feedback of her blood pressure readings).  Also, given the sob, I can get her scheduled to see pulmonary. She has had cardiac w/up and was unrevealing.  If desires pulmonary evaluation, let me know and I can place order for referral.  Also, let me know if she has a preference of which pulmonologist she prefers to see.

## 2019-12-30 NOTE — Telephone Encounter (Signed)
See other my chart message regarding blood pressure and sob.

## 2019-12-30 NOTE — Telephone Encounter (Signed)
I will consult with Pt about pulmonology referral but do you have any information about how to get an ambulatory blood pressure monitor for her?

## 2019-12-30 NOTE — Telephone Encounter (Signed)
See Kathy's note for ambulatory bp monitor. She sees Dr Lady Gary for cardiology. Do you want me to try to set her up for a follow up with him? Also, I will discuss pulmonary referral with her.

## 2019-12-30 NOTE — Telephone Encounter (Signed)
The Ambulatory blood pressure monitoring is usually done through cardiology, normally referral placed to cardiology and then placed by cardiology.

## 2019-12-30 NOTE — Telephone Encounter (Signed)
If you can call Misty Stanley - cardiology (I can give you her extension) - let her know that [name] has seen Dr Lady Gary.  (Dr [place] daughter).  See if they are able to arrange for ambulatory blood pressure monitor. Apparently we are not able to do here anymore.  If so, please schedule an appt.

## 2020-01-02 NOTE — Telephone Encounter (Signed)
Called Tonya Harris. Message was sent over to nurse but they are in [place] today so will be Monday before they will know.

## 2020-01-06 ENCOUNTER — Encounter: Payer: Self-pay | Admitting: Internal Medicine

## 2020-01-07 ENCOUNTER — Other Ambulatory Visit: Payer: Self-pay

## 2020-01-07 ENCOUNTER — Encounter: Payer: Self-pay | Admitting: Internal Medicine

## 2020-01-07 ENCOUNTER — Ambulatory Visit (INDEPENDENT_AMBULATORY_CARE_PROVIDER_SITE_OTHER): Payer: Self-pay | Admitting: Internal Medicine

## 2020-01-07 DIAGNOSIS — R253 Fasciculation: Secondary | ICD-10-CM

## 2020-01-07 DIAGNOSIS — R030 Elevated blood-pressure reading, without diagnosis of hypertension: Secondary | ICD-10-CM

## 2020-01-07 DIAGNOSIS — R1310 Dysphagia, unspecified: Secondary | ICD-10-CM

## 2020-01-07 DIAGNOSIS — E162 Hypoglycemia, unspecified: Secondary | ICD-10-CM

## 2020-01-07 DIAGNOSIS — R0602 Shortness of breath: Secondary | ICD-10-CM

## 2020-01-07 NOTE — Progress Notes (Signed)
Patient ID: Tonya Harris, female   DOB: September 08, 1981, 38 y.o.   MRN: [id number]   Subjective:    Patient ID: Tonya Harris, female    DOB: Jul 27, 1981, 39 y.o.   MRN: [id number]  HPI This visit occurred during the SARS-CoV-2 public health emergency.  Safety protocols were in place, including screening questions prior to the visit, additional usage of staff PPE, and extensive cleaning of exam room while observing appropriate contact time as indicated for disinfecting solutions.  Patient here for a scheduled physical, but given increased concerns, visit changed to follow up appointment.  Has been evaluated for muscle twitching, fatigue, weakness and swallowing problems.  Has had extensive testing, including NCS/EMG x 2, MRI brain, MrI cervical spine, MRI thoracic spine and serology testing - normal.  Per neurology, felt to have benign fasciculations.  Has seen cardiology to work up sob.  Testing unrevealing.  No low sugars recently.  SOB has persisted.  Describes when walking, especially going up stairs, will get sob.  No cough or congestion.  Tries to walk.  Has previously been very active.  Still with tremors/shaking.  Has raynaud's.  Nail beds will be red - fingers white.  Varying msk concerns - legs hurt, pain on left side when lying down and trouble turning head and neck.  Discussed the increased stress related to the above.  Discussed testing.  Seeing Dr Nicolasa Ducking.  Anxiety is better (per pt- feels is managing the anxiety better).  Has good family support.    Past Medical History:  Diagnosis Date  . Gestational diabetes    Past Surgical History:  Procedure Laterality Date  . APPENDECTOMY     Family History  Problem Relation Age of Onset  . Hypertension Mother   . Hypertension Father    Social History   Socioeconomic History  . Marital status: Married    Spouse name: Not on file  . Number of children: 4  . Years of education: Not on file  . Highest education level: Master's degree  (e.g., MA, MS, MEng, MEd, MSW, MBA)  Occupational History  . Occupation: Pharmacist, hospital  . Occupation: Stay at home mom  Tobacco Use  . Smoking status: Never Smoker  . Smokeless tobacco: Never Used  Substance and Sexual Activity  . Alcohol use: Not Currently  . Drug use: Not Currently  . Sexual activity: Yes    Partners: Male  Other Topics Concern  . 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 Difficulty of Paying Living Expenses: Not on file  Food Insecurity:   . Worried About Charity fundraiser in the Last Year: Not on file  . Ran Out of Food in the Last Year: Not on file  Transportation Needs:   . Lack of Transportation (Medical): Not on file  . Lack of Transportation (Non-Medical): Not on file  Physical Activity:   . Days of Exercise per Week: Not on file  . Minutes of Exercise per Session: Not on file  Stress:   . Feeling of Stress : Not on file  Social Connections:   . Frequency of Communication with Friends and Family: Not on file  . Frequency of Social Gatherings with Friends and Family: Not on file  . Attends Religious Services: Not on  file  . Active Member of Clubs or Organizations: Not on file  . Attends Banker Meetings: Not on file  . Marital Status: Not on file    Outpatient Encounter Medications as of 01/07/2020  Medication Sig  . DULoxetine (CYMBALTA) 60 MG capsule Take 60 mg by mouth daily.  . Multiple Vitamin (MULTIVITAMIN) tablet Take 1 tablet by mouth daily.  . sertraline (ZOLOFT) 100 MG tablet Take 100 mg by mouth daily.   No facility-administered encounter medications on file as of 01/07/2020.   Review of Systems  Constitutional: Positive for fatigue. Negative for appetite change.       Has gained weight.   HENT: Negative for congestion and sinus pressure.   Respiratory: Positive for  shortness of breath. Negative for cough and chest tightness.   Cardiovascular: Negative for chest pain and leg swelling.  Gastrointestinal: Negative for abdominal pain, nausea and vomiting.  Genitourinary: Negative for difficulty urinating and dysuria.  Musculoskeletal:       Leg aching/pain as outlined.  Trouble turning head/neck.  Tremors and shaking as outlined.    Skin: Negative for color change and rash.  Neurological: Negative for syncope and headaches.  Psychiatric/Behavioral: Negative for agitation and dysphoric mood.       Increased stress related to her physical issues and concerns.        Objective:    Physical Exam Constitutional:      General: She is not in acute distress.    Appearance: Normal appearance.  HENT:     Head: Normocephalic and atraumatic.  Eyes:     General: No scleral icterus.       Right eye: No discharge.        Left eye: No discharge.     Conjunctiva/sclera: Conjunctivae normal.  Neck:     Thyroid: No thyromegaly.  Cardiovascular:     Rate and Rhythm: Normal rate and regular rhythm.  Pulmonary:     Effort: No respiratory distress.     Breath sounds: Normal breath sounds. No wheezing.  Abdominal:     General: Bowel sounds are normal.     Palpations: Abdomen is soft.     Tenderness: There is no abdominal tenderness.  Musculoskeletal:        General: No swelling or tenderness.     Cervical back: Neck supple. No tenderness.  Lymphadenopathy:     Cervical: No cervical adenopathy.  Skin:    Findings: No erythema or rash.  Neurological:     Mental Status: She is alert.  Psychiatric:        Mood and Affect: Mood normal.        Behavior: Behavior normal.     BP 126/70   Pulse 82   Temp 97.9 F (36.6 C)   Resp 16   Wt 166 lb 3.2 oz (75.4 kg)   SpO2 99%   BMI 27.66 kg/m  Wt Readings from Last 3 Encounters:  01/07/20 166 lb 3.2 oz (75.4 kg)  12/12/19 158 lb (71.7 kg)  11/28/19 158 lb (71.7 kg)     Lab Results  Component Value  Date   WBC 8.8 10/20/2019   HGB 13.1 10/20/2019   HCT 41.5 10/20/2019   PLT 311 10/20/2019   GLUCOSE 122 (H) 10/20/2019   ALT 16 06/25/2019   AST 17 06/25/2019   NA 138 10/20/2019   K 3.8 10/20/2019   CL 106 10/20/2019   CREATININE 0.79 10/20/2019   BUN 6 10/20/2019   CO2 24  10/20/2019   TSH 2.37 09/02/2018    DG Chest 2 View  Result Date: 10/20/2019 CLINICAL DATA:  Shortness of breath EXAM: CHEST - 2 VIEW COMPARISON:  07/11/2019 FINDINGS: Heart and mediastinal contours are within normal limits. No focal opacities or effusions. No acute bony abnormality. IMPRESSION: No active cardiopulmonary disease. Electronically Signed   By: Charlett Nose M.D.   On: 10/20/2019 18:32       Assessment & Plan:   Problem List Items Addressed This Visit    Dysphagia    Have discussed swallowing evaluation.  Wanted to hold.  Follow.        Elevated blood pressure reading    Blood pressures in the office are ok.  She has some elevated readings on outside checks.  Have discussed ambulatory blood pressure monitoring.  Trying to arrange through cardiology.       Hypoglycemia    Previous episode of low sugar.  No recent lows.  Follow.       Muscle twitching    Has seen neurology.  EMG/NCS x 2 normal.  Per neurology, felt to have benign fasciculations.  No further w/up felt warranted.  Follow.        SOB (shortness of breath)    SOB with exertion persists.  Saw cardiology.  Stress test negative.  Monitor unrevealing (21 day monitor).  States gets breathless with ambulating, going up stairs.  Previous cxr ok.  Discussed pulmonary evaluation. She is in agreement.        Relevant Orders   Ambulatory referral to Pulmonology       [place], MD

## 2020-01-11 ENCOUNTER — Encounter: Payer: Self-pay | Admitting: Internal Medicine

## 2020-01-12 ENCOUNTER — Encounter: Payer: Self-pay | Admitting: Internal Medicine

## 2020-01-12 DIAGNOSIS — R1310 Dysphagia, unspecified: Secondary | ICD-10-CM | POA: Insufficient documentation

## 2020-01-12 NOTE — Assessment & Plan Note (Signed)
Blood pressures in the office are ok.  She has some elevated readings on outside checks.  Have discussed ambulatory blood pressure monitoring.  Trying to arrange through cardiology.

## 2020-01-12 NOTE — Assessment & Plan Note (Signed)
Previous episode of low sugar.  No recent lows.  Follow.

## 2020-01-12 NOTE — Assessment & Plan Note (Signed)
SOB with exertion persists.  Saw cardiology.  Stress test negative.  Monitor unrevealing (21 day monitor).  States gets breathless with ambulating, going up stairs.  Previous cxr ok.  Discussed pulmonary evaluation. She is in agreement.

## 2020-01-12 NOTE — Assessment & Plan Note (Signed)
Has seen neurology.  EMG/NCS x 2 normal.  Per neurology, felt to have benign fasciculations.  No further w/up felt warranted.  Follow.

## 2020-01-12 NOTE — Telephone Encounter (Signed)
See other message regarding f/u with cardiology.

## 2020-01-12 NOTE — Telephone Encounter (Signed)
Do we have the information from cardiology regarding ambulatory blood pressure monitoring?  Given her continued concerns regarding her blood pressure and hear issues, I would like to refer her back to see Dr Lady Gary.

## 2020-01-12 NOTE — Assessment & Plan Note (Signed)
Have discussed swallowing evaluation.  Wanted to hold.  Follow.

## 2020-01-13 ENCOUNTER — Encounter: Payer: Self-pay | Admitting: Internal Medicine

## 2020-01-14 NOTE — Telephone Encounter (Signed)
Agree with cardiology evaluation given persistent sob.

## 2020-01-14 NOTE — Telephone Encounter (Signed)
See other my chart message.  Given persistent ear pain, can refer her to ENT for further evaluation.  dd

## 2020-01-14 NOTE — Telephone Encounter (Signed)
Spoke with Misty Stanley at [facility] cardiology. They do not do the ambulatory blood pressure monitors. Misty Stanley consulted with Dr Lady Gary and he stated they cannot find a company that does them anymore. I am going to f/u with patient and let her know that she needs a f/u with Dr Lady Gary to address her BP concerns.

## 2020-01-14 NOTE — Telephone Encounter (Signed)
Patient aware that they cannot do ambulatory BP. She is going to call Dr Lady Gary and set up appt.

## 2020-01-15 NOTE — Telephone Encounter (Signed)
See other message

## 2020-01-20 ENCOUNTER — Telehealth: Payer: Self-pay | Admitting: Internal Medicine

## 2020-01-20 ENCOUNTER — Encounter: Payer: Self-pay | Admitting: Internal Medicine

## 2020-01-20 NOTE — Telephone Encounter (Signed)
Pt states that she has an appt in April made through MyChart but needs to be sooner. I did not see any appts available. She would not disclose what she needs to be seen for but states that it is ongoing issues

## 2020-01-21 NOTE — Telephone Encounter (Signed)
See my chart message

## 2020-01-21 NOTE — Telephone Encounter (Signed)
I can work her in somewhere, but if acute symptoms she needs to be seen more urgently.

## 2020-01-21 NOTE — Telephone Encounter (Signed)
Please advise somewhere to work in. See me if questions.

## 2020-01-29 ENCOUNTER — Encounter: Payer: Self-pay | Admitting: Internal Medicine

## 2020-01-29 ENCOUNTER — Ambulatory Visit (INDEPENDENT_AMBULATORY_CARE_PROVIDER_SITE_OTHER): Payer: Self-pay | Admitting: Internal Medicine

## 2020-01-29 ENCOUNTER — Other Ambulatory Visit: Payer: Self-pay

## 2020-01-29 DIAGNOSIS — R0602 Shortness of breath: Secondary | ICD-10-CM

## 2020-01-29 DIAGNOSIS — F419 Anxiety disorder, unspecified: Secondary | ICD-10-CM

## 2020-01-29 DIAGNOSIS — R030 Elevated blood-pressure reading, without diagnosis of hypertension: Secondary | ICD-10-CM

## 2020-01-29 DIAGNOSIS — R253 Fasciculation: Secondary | ICD-10-CM

## 2020-01-29 DIAGNOSIS — R5383 Other fatigue: Secondary | ICD-10-CM

## 2020-01-29 DIAGNOSIS — R531 Weakness: Secondary | ICD-10-CM

## 2020-01-29 DIAGNOSIS — R1310 Dysphagia, unspecified: Secondary | ICD-10-CM

## 2020-01-29 DIAGNOSIS — E162 Hypoglycemia, unspecified: Secondary | ICD-10-CM

## 2020-01-29 LAB — CBC WITH DIFFERENTIAL/PLATELET
Basophils Absolute: 0.1 10*3/uL (ref 0.0–0.1)
Basophils Relative: 1.4 % (ref 0.0–3.0)
Eosinophils Absolute: 0 10*3/uL (ref 0.0–0.7)
Eosinophils Relative: 0.3 % (ref 0.0–5.0)
HCT: 39.5 % (ref 36.0–46.0)
Hemoglobin: 13.2 g/dL (ref 12.0–15.0)
Lymphocytes Relative: 26.2 % (ref 12.0–46.0)
Lymphs Abs: 1.7 10*3/uL (ref 0.7–4.0)
MCHC: 33.4 g/dL (ref 30.0–36.0)
MCV: 88 fl (ref 78.0–100.0)
Monocytes Absolute: 0.3 10*3/uL (ref 0.1–1.0)
Monocytes Relative: 5.1 % (ref 3.0–12.0)
Neutro Abs: 4.4 10*3/uL (ref 1.4–7.7)
Neutrophils Relative %: 67 % (ref 43.0–77.0)
Platelets: 291 10*3/uL (ref 150.0–400.0)
RBC: 4.48 Mil/uL (ref 3.87–5.11)
RDW: 13.5 % (ref 11.5–15.5)
WBC: 6.6 10*3/uL (ref 4.0–10.5)

## 2020-01-29 LAB — COMPREHENSIVE METABOLIC PANEL
ALT: 16 U/L (ref 0–35)
AST: 18 U/L (ref 0–37)
Albumin: 4.6 g/dL (ref 3.5–5.2)
Alkaline Phosphatase: 38 U/L — ABNORMAL LOW (ref 39–117)
BUN: 11 mg/dL (ref 6–23)
CO2: 28 mEq/L (ref 19–32)
Calcium: 9.6 mg/dL (ref 8.4–10.5)
Chloride: 104 mEq/L (ref 96–112)
Creatinine, Ser: 0.69 mg/dL (ref 0.40–1.20)
GFR: 95.86 mL/min (ref >60.00)
Glucose, Bld: 88 mg/dL (ref 70–99)
Potassium: 4.3 mEq/L (ref 3.5–5.1)
Sodium: 136 mEq/L (ref 135–145)
Total Bilirubin: 0.5 mg/dL (ref 0.2–1.2)
Total Protein: 7.1 g/dL (ref 6.0–8.3)

## 2020-01-29 LAB — TSH: TSH: 1.51 u[IU]/mL (ref 0.35–4.50)

## 2020-01-29 NOTE — Progress Notes (Signed)
Patient ID: Tonya Harris, female   DOB: 08-13-1981, 39 y.o.   MRN: [id number]   Subjective:    Patient ID: Tonya Harris, female    DOB: [birth year], 39 y.o.   MRN: [id number]  HPI This visit occurred during the SARS-CoV-2 public health emergency.  Safety protocols were in place, including screening questions prior to the visit, additional usage of staff PPE, and extensive cleaning of exam room while observing appropriate contact time as indicated for disinfecting solutions.  Patient here for work in appt. She has been being evaluated recently for muscle twitching, fatigue, weakness and sob.  Has had extensive testing including NCS/EMG x 2, MRI brain, MRI cervical spine, MRI thoracic spine and serology testing - normal.  Per neurology, felt to have benign fasciculations. Still with tremors/shaking.  Has raynaud's.  Nail beds red and fingers white.  Has seen rheumatology.   Has also seen cardiology to work up sob.  Stress test and holter monitoring - unrevealing.  She is still reporting increased sob.  States when she moves, she will get sob.  Especially notices when she carries her son up stairs.  Notices sob with movement and with bending.  Describes at times feeling "hungry for air".  She is eating.  States had some nausea yesterday.  Gagging.  No vomiting.  Bowels moving.  Has tried pepcid previously.  No real change in symptoms.  She is concerned given persistent symptoms and no diagnosis to date.  Discussed with her today.  Discussed reassuring tests to date.  She has an appt scheduled with pulmonary beginning of April to discuss her sob. She is also concerned regarding her blood pressure.  States is elevated at home.  Discussed blood pressure here - looks good.    Past Medical History:  Diagnosis Date  . Gestational diabetes    Past Surgical History:  Procedure Laterality Date  . APPENDECTOMY     Family History  Problem Relation Age of Onset  . Hypertension Mother   . Hypertension  Father    Social History   Socioeconomic History  . Marital status: Married    Spouse name: Not on file  . Number of children: 4  . Years of education: Not on file  . Highest education level: Master's degree (e.g., MA, MS, MEng, MEd, MSW, MBA)  Occupational History  . Occupation: Pharmacist, hospital  . Occupation: Stay at home mom  Tobacco Use  . Smoking status: Never Smoker  . Smokeless tobacco: Never Used  Substance and Sexual Activity  . Alcohol use: Not Currently  . Drug use: Not Currently  . Sexual activity: Yes    Partners: Male  Other Topics Concern  . 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 Difficulty of Paying Living Expenses:   Food Insecurity:   . Worried About Charity fundraiser in the Last Year:   . Arboriculturist in the Last Year:   Transportation Needs:   . Film/video editor (Medical):   Marland Kitchen Lack of Transportation (Non-Medical):   Physical Activity:   . Days of Exercise per Week:   . Minutes of Exercise per Session:   Stress:   . Feeling of Stress :   Social Connections:   . Frequency of Communication with  Friends and Family:   . Frequency of Social Gatherings with Friends and Family:   . Attends Religious Services:   . Active Member of Clubs or Organizations:   . Attends Archivist Meetings:   Marland Kitchen Marital Status:     Outpatient Encounter Medications as of 01/29/2020  Medication Sig  . DULoxetine (CYMBALTA) 60 MG capsule Take 60 mg by mouth daily.  . Multiple Vitamin (MULTIVITAMIN) tablet Take 1 tablet by mouth daily.  . sertraline (ZOLOFT) 100 MG tablet Take 100 mg by mouth daily.   No facility-administered encounter medications on file as of 01/29/2020.    Review of Systems  Constitutional: Negative for appetite change and fatigue.  HENT: Negative for congestion and sinus pressure.    Respiratory: Positive for shortness of breath. Negative for cough and wheezing.   Cardiovascular: Negative for chest pain, palpitations and leg swelling.  Gastrointestinal: Positive for nausea. Negative for abdominal pain, diarrhea and vomiting.  Genitourinary: Negative for difficulty urinating and dysuria.  Musculoskeletal: Negative for joint swelling and myalgias.  Skin: Negative for rash.       Describes and has pictures - red nail beds and white fingers - appears to be c/w raynauds.    Neurological: Negative for syncope and headaches.  Psychiatric/Behavioral: Negative for dysphoric mood.       Increased stress related to ongoing symptoms.         Objective:    Physical Exam Constitutional:      General: She is not in acute distress.    Appearance: Normal appearance.  HENT:     Head: Normocephalic and atraumatic.     Right Ear: External ear normal.     Left Ear: External ear normal.  Eyes:     General: No scleral icterus.       Right eye: No discharge.        Left eye: No discharge.     Conjunctiva/sclera: Conjunctivae normal.  Cardiovascular:     Rate and Rhythm: Normal rate and regular rhythm.     Heart sounds: No murmur.  Pulmonary:     Effort: Pulmonary effort is normal.     Breath sounds: Normal breath sounds. No wheezing.  Abdominal:     General: Bowel sounds are normal.     Tenderness: There is no abdominal tenderness.  Musculoskeletal:        General: No swelling or tenderness.     Cervical back: Neck supple. No tenderness.  Lymphadenopathy:     Cervical: No cervical adenopathy.  Skin:    Findings: No erythema or rash.  Neurological:     Mental Status: She is alert.  Psychiatric:        Mood and Affect: Mood normal.        Behavior: Behavior normal.     BP 114/62   Pulse 94   Temp (!) 97.1 F (36.2 C)   Resp 16   Ht '5\' 5"'  (1.651 m)   Wt 166 lb (75.3 kg)   SpO2 98%   BMI 27.62 kg/m  Wt Readings from Last 3 Encounters:  01/29/20 166 lb (75.3  kg)  01/07/20 166 lb 3.2 oz (75.4 kg)  12/12/19 158 lb (71.7 kg)     Lab Results  Component Value Date   WBC 6.6 01/29/2020   HGB 13.2 01/29/2020   HCT 39.5 01/29/2020   PLT 291.0 01/29/2020   GLUCOSE 88 01/29/2020   ALT 16 01/29/2020   AST 18 01/29/2020   NA 136 01/29/2020  K 4.3 01/29/2020   CL 104 01/29/2020   CREATININE 0.69 01/29/2020   BUN 11 01/29/2020   CO2 28 01/29/2020   TSH 1.51 01/29/2020    DG Chest 2 View  Result Date: 10/20/2019 CLINICAL DATA:  Shortness of breath EXAM: CHEST - 2 VIEW COMPARISON:  07/11/2019 FINDINGS: Heart and mediastinal contours are within normal limits. No focal opacities or effusions. No acute bony abnormality. IMPRESSION: No active cardiopulmonary disease. Electronically Signed   By: Rolm Baptise M.D.   On: 10/20/2019 18:32       Assessment & Plan:   Problem List Items Addressed This Visit    Anxiety    Reports is better than when symptoms first began.  Sees Dr Nicolasa Ducking.  Feels stable on current medication regimen.  Discussed with her today.  Discussed importance of f/u with Dr Nicolasa Ducking to help with anxiety related to her current symptoms and the increased stress for her from inability to specifically diagnose.  She is in agreement for f/u.       Dysphagia    Have discussed swallowing evaluation.  She had wanted to monitor.  Follow.        Elevated blood pressure reading    Blood pressure here under good control.  Hold on medication.  Follow pressures.        Fatigue    Decreased energy and fatigue as outlined.  W/up to date as outlined.  Recheck cbc, met c and tsh.        Relevant Orders   CBC with Differential/Platelet (Completed)   Comprehensive metabolic panel (Completed)   TSH (Completed)   Hypoglycemia    Previous episode of low sugars.  Has seen endocrinology.  Has standing orders if recurs.  Follow.  Not an issue lately.       Muscle twitching    Twitching and fasciculations continue.  Has seen neurology.  EMG/NCS x 2  normal.  Per neurology, felt to have benign fasciculations.  Felt no further w/up warranted.        SOB (shortness of breath)    SOB with exertion as outlined.  Saw cardiology.  W/up unrevealing.  Persistent sob with exertion, especially notices with going up stairs, etc.  CXR - ok.  5 minute walk - pulse remained 98-99%.  Has a scheduled appt with pulmonary at the beginning of April for further evaluation.        Weakness    Recently evaluated by neurology as outlined.  No further w/up warranted at this time.  Follow.            Einar Pheasant, MD

## 2020-01-30 ENCOUNTER — Encounter: Payer: Self-pay | Admitting: Internal Medicine

## 2020-02-08 ENCOUNTER — Encounter: Payer: Self-pay | Admitting: Internal Medicine

## 2020-02-08 NOTE — Assessment & Plan Note (Signed)
Previous episode of low sugars.  Has seen endocrinology.  Has standing orders if recurs.  Follow.  Not an issue lately.

## 2020-02-08 NOTE — Assessment & Plan Note (Signed)
Twitching and fasciculations continue.  Has seen neurology.  EMG/NCS x 2 normal.  Per neurology, felt to have benign fasciculations.  Felt no further w/up warranted.

## 2020-02-08 NOTE — Assessment & Plan Note (Signed)
Blood pressure here under good control.  Hold on medication.  Follow pressures.

## 2020-02-08 NOTE — Assessment & Plan Note (Signed)
Decreased energy and fatigue as outlined.  W/up to date as outlined.  Recheck cbc, met c and tsh.

## 2020-02-08 NOTE — Assessment & Plan Note (Signed)
SOB with exertion as outlined.  Saw cardiology.  W/up unrevealing.  Persistent sob with exertion, especially notices with going up stairs, etc.  CXR - ok.  5 minute walk - pulse remained 98-99%.  Has a scheduled appt with pulmonary at the beginning of April for further evaluation.

## 2020-02-08 NOTE — Assessment & Plan Note (Signed)
Have discussed swallowing evaluation.  She had wanted to monitor.  Follow.

## 2020-02-08 NOTE — Assessment & Plan Note (Signed)
Reports is better than when symptoms first began.  Sees Dr Maryruth Bun.  Feels stable on current medication regimen.  Discussed with her today.  Discussed importance of f/u with Dr Maryruth Bun to help with anxiety related to her current symptoms and the increased stress for her from inability to specifically diagnose.  She is in agreement for f/u.

## 2020-02-08 NOTE — Assessment & Plan Note (Signed)
Recently evaluated by neurology as outlined.  No further w/up warranted at this time.  Follow.

## 2020-02-09 ENCOUNTER — Ambulatory Visit: Payer: Self-pay | Admitting: Internal Medicine

## 2020-02-11 ENCOUNTER — Other Ambulatory Visit: Payer: Self-pay

## 2020-02-11 ENCOUNTER — Ambulatory Visit (INDEPENDENT_AMBULATORY_CARE_PROVIDER_SITE_OTHER): Payer: Self-pay | Admitting: Pulmonary Disease

## 2020-02-11 ENCOUNTER — Encounter: Payer: Self-pay | Admitting: Pulmonary Disease

## 2020-02-11 VITALS — BP 112/66 | HR 74 | Ht 65.0 in | Wt 164.2 lb

## 2020-02-11 DIAGNOSIS — K219 Gastro-esophageal reflux disease without esophagitis: Secondary | ICD-10-CM

## 2020-02-11 DIAGNOSIS — R0602 Shortness of breath: Secondary | ICD-10-CM

## 2020-02-11 MED ORDER — OMEPRAZOLE 40 MG PO CPDR: 40.0000 mg | DELAYED_RELEASE_CAPSULE | Freq: Every evening | ORAL | 3 refills | Status: DC

## 2020-02-11 NOTE — Patient Instructions (Addendum)
We are rechecking an echocardiogram to make sure there is no mitral valve prolapse  I discussed the possibility of medication interaction with Dr. Lorin Picket and she will check as to management of the 2 medications namely Cymbalta and Zoloft  Your oxygen level remained very good today during exercise  We are going to get breathing tests  We will see you in follow-up in 6 weeks time call sooner should any new problems arise   We are starting you on some omeprazole evening to see if this helps your reflux

## 2020-02-11 NOTE — Progress Notes (Signed)
Subjective:    Patient ID: Tonya Harris, female    DOB: 1981-07-03, 39 y.o.   MRN: [id number]  HPI Tonya Harris is a 39 year old lifelong never smoker who presents for evaluation of shortness of breath, chest pain and fatigue.  She is kindly referred by Dr. Dale Campo Verde.  She has had the symptoms for approximately 8 months or perhaps more.  She states that she noted issues with muscle weakness and fatigue.  She also noted significant anxiety and palpitations.  Since the onset of symptoms the palpitations and anxiety have subsided somewhat but she continues to have persistent issues with fatigue, dyspnea particularly when going up steps and occasionally conversational dyspnea.  Symptoms are made better by absolute rest.  Symptoms are worsened by going up steps as noted above.  The patient has not had any fevers, chills or sweats.  No hemoptysis.  She has noted what she describes as Raynaud's phenomenon, she did show me some photos some of these have the appearance of perhaps erythromelalgia.  She has also noted twitching muscles.  She does admit to stress due to COVID-19 lockdown's and having 4 children with 3 to home school.  She does describe significant gastroesophageal reflux and occasionally wakes up gasping for air.  The description sounds like episodes of reflux with laryngospasm.  She does note that her hands have been swelling more.  She also has had significant arthralgias.  She has noted at times to have somewhat pressured speech.  She also notes fluctuations in her blood pressure particularly diastolic hypertension.  She did show me records of her fluctuating blood pressures.  She has had extensive work-up to include cardiology work-up and evaluation by [facility]ology at [facility].  All have been negative or inconclusive.  MRI of the brain in 2019 that was normal with no suggestion of multiple sclerosis.  She has had heavy metal measurements, EMGs, SPEP and a host of rheumatologic titers all  negative.  She also has been tested for Lyme's disease that was negative.  I have reviewed her current medications in detail.  Of note she is on Cymbalta and Zoloft.  I have reviewed all of her past medical history, surgical history, and family history.  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  She has 4 children ages 97, 27, 4 and 2.   Review of Systems A 10 point review of systems was performed and it is as noted above otherwise negative.    Objective:   Physical Exam BP 112/66 (BP Location: Left Arm, Cuff Size: Normal)   Pulse 74   Ht 5\' 5"  (1.651 m)   Wt 164 lb 3.2 oz (74.5 kg)   SpO2 98%   BMI 27.32 kg/m  GENERAL: Well-developed, well-nourished, somewhat anxious no respiratory distress. HEAD: Normocephalic, atraumatic.  EYES: Pupils equal, round, reactive to light.  No scleral icterus.  MOUTH: Nose/mouth/throat not examined due to masking requirements for COVID 19. NECK: Supple. No thyromegaly. No nodules. No JVD.  Trachea midline, no crepitus. PULMONARY: Lungs clear to auscultation bilaterally.  Good air entry bilaterally. CARDIOVASCULAR: S1 and S2. Regular rate and rhythm.  Query midsystolic click. GASTROINTESTINAL: Abdomen is soft. MUSCULOSKELETAL: No joint deformity, no clubbing, no edema.  Nailbeds pink. NEUROLOGIC: No focal deficit noted.  Awake and alert, speech is fluent though at times somewhat pressured.  Her muscle strength appears to be symmetrical throughout. SKIN: Intact,warm,dry.  Limited exam shows no rashes. PSYCH: Anxious.  Behavior  otherwise normal.  Chest x-ray performed December 2020:   Ambulatory oximetry was performed patient did not have any desaturations with exercise   Assessment & Plan:   Dyspnea/shortness of breath Currently there is no indication to suggest pulmonary disease Will obtain PFTs nevertheless Multiple associated symptoms Query serotonin excess as on 2  SSRIs Consider weaning off SSRIs Query mitral valve prolapse with dysautonomia 2D echo Other possibility could be catechol excess pheochromocytoma/paraganglioma? Consider urine catecholamine determination FTs and 2D echo not conclusive Should be off of SSRIs for that determination  Extensive work-up negative/inconclusive so far   Gastroesophageal reflux Omeprazole 40 mg prior to evening meal  Follow-up will be in 6 weeks time she is to contact us prior to that time should any new difficulties arise.  Case was discussed with Dr. Einar Pheasant.  Renold Don, MD [facility]  *This note was dictated using voice recognition software/Dragon.  Despite best efforts to proofread, errors can occur which can change the meaning.  Any change was purely unintentional.

## 2020-02-12 ENCOUNTER — Telehealth: Payer: Self-pay | Admitting: Internal Medicine

## 2020-02-12 NOTE — Telephone Encounter (Signed)
Please call pt and inform her that I did speak to Dr Jayme Cloud after her visit.  I would like for her to contact Dr Maryruth Bun and schedule a f/u to discuss the possibility of adjusting/changing her medication.  I have messaged Dr Maryruth Bun as well.

## 2020-02-13 NOTE — Telephone Encounter (Signed)
LMTCB

## 2020-02-17 ENCOUNTER — Encounter: Payer: Self-pay | Admitting: Internal Medicine

## 2020-02-17 ENCOUNTER — Ambulatory Visit
Admission: RE | Admit: 2020-02-17 | Discharge: 2020-02-17 | Disposition: A | Discharge status: Home / Self Care | Payer: Self-pay | Source: Ambulatory Visit | Attending: Pulmonary Disease | Admitting: Pulmonary Disease

## 2020-02-17 ENCOUNTER — Other Ambulatory Visit: Payer: Self-pay

## 2020-02-17 ENCOUNTER — Ambulatory Visit (INDEPENDENT_AMBULATORY_CARE_PROVIDER_SITE_OTHER): Payer: Self-pay | Admitting: Internal Medicine

## 2020-02-17 DIAGNOSIS — R531 Weakness: Secondary | ICD-10-CM

## 2020-02-17 DIAGNOSIS — F419 Anxiety disorder, unspecified: Secondary | ICD-10-CM

## 2020-02-17 DIAGNOSIS — M791 Myalgia, unspecified site: Secondary | ICD-10-CM

## 2020-02-17 DIAGNOSIS — R5383 Other fatigue: Secondary | ICD-10-CM

## 2020-02-17 DIAGNOSIS — R030 Elevated blood-pressure reading, without diagnosis of hypertension: Secondary | ICD-10-CM

## 2020-02-17 DIAGNOSIS — R1310 Dysphagia, unspecified: Secondary | ICD-10-CM

## 2020-02-17 DIAGNOSIS — R0602 Shortness of breath: Secondary | ICD-10-CM | POA: Insufficient documentation

## 2020-02-17 DIAGNOSIS — R253 Fasciculation: Secondary | ICD-10-CM

## 2020-02-17 DIAGNOSIS — E162 Hypoglycemia, unspecified: Secondary | ICD-10-CM

## 2020-02-17 NOTE — Progress Notes (Signed)
*  PRELIMINARY RESULTS* Echocardiogram 2D Echocardiogram has been performed.  Joanette Gula Sumiye Hirth 02/17/2020, 11:44 AM

## 2020-02-17 NOTE — Progress Notes (Signed)
Patient ID: Tonya Harris, female   DOB: 1980/12/30, 39 y.o.   MRN: [id number]   Subjective:    Patient ID: Tonya Harris, female    DOB: 04/29/1981, 39 y.o.   MRN: [id number]  HPI This visit occurred during the SARS-CoV-2 public health emergency.  Safety protocols were in place, including screening questions prior to the visit, additional usage of staff PPE, and extensive cleaning of exam room while observing appropriate contact time as indicated for disinfecting solutions.  Patient here for a scheduled follow up.  Undergoing evaluation for sob, chest pain and fatigue.  Also has noticed persistent muscle weakness and fasciculations.  Her w/up to date has included cardiology w/up and evaluation by rheumatology - with no etiology determined.  S/p MRI brain 2019 - normal with no suggestions of MS.  Had heavy metal measurements, EMGs, SPEP and rheumatologic titers - negative.  Has noticed problems with sob with exertion - especially going up stairs.  Legs - ache.  Increased soreness.  Some difficulty swallowing.  Twitching around her mouth and persistent muscle fasciculations.  Saw pulmonary. Planning for PFTs.  Also had f/u echol. Waiting for final results.  Pulmonary brought up concerns regarding her taking cymbalta and zoloft.  She is followed by psychiatry.  Currently cymbalta being tapered.  Having persistent aching and soreness with weakness, we discussed her neurology w/up to date.  After discussion, she would like a follow up appointment with Dr Sherryll Burger to see if any further neurological w/up is warranted.    Past Medical History:  Diagnosis Date  . Gestational diabetes    Past Surgical History:  Procedure Laterality Date  . APPENDECTOMY     Family History  Problem Relation Age of Onset  . Hypertension Mother   . Hypertension Father    Social History   Socioeconomic History  . Marital status: Married    Spouse name: Not on file  . Number of children: 4  . Years of education: Not  on file  . Highest education level: Master's degree (e.g., MA, MS, MEng, MEd, MSW, MBA)  Occupational History  . 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 Smoking status: Never Smoker  . Smokeless tobacco: Never Used  Substance and Sexual Activity  . Alcohol use: Not Currently  . Drug use: Not Currently  . Sexual activity: Yes    Partners: Male  Other Topics Concern  . 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 Difficulty of Paying Living Expenses:   Food Insecurity:   . Worried About Programme researcher, broadcasting/film/video in the Last Year:   . Barista in the Last Year:   Transportation Needs:   . Freight forwarder (Medical):   Marland Kitchen Lack of Transportation (Non-Medical):   Physical Activity:   . Days of Exercise per Week:   . Minutes of Exercise per Session:   Stress:   . Feeling of Stress :   Social Connections:   . Frequency of Communication with Friends and Family:   . Frequency of Social Gatherings with Friends and Family:   . Attends Religious Services:   . Active Member of Clubs or Organizations:   . Attends Banker Meetings:   Marland Kitchen Marital  Status:     Outpatient Encounter Medications as of 02/17/2020  Medication Sig  . DULoxetine (CYMBALTA) 60 MG capsule Take 30 mg by mouth daily.   . Multiple Vitamin (MULTIVITAMIN) tablet Take 1 tablet by mouth daily.  Marland Kitchen omeprazole (PRILOSEC) 40 MG capsule Take 1 capsule (40 mg total) by mouth every evening.  . sertraline (ZOLOFT) 100 MG tablet Take 100 mg by mouth daily.   No facility-administered encounter medications on file as of 02/17/2020.   Review of Systems  Constitutional: Positive for fatigue. Negative for appetite change.  HENT: Negative for congestion and sinus pressure.   Respiratory: Positive for shortness of breath. Negative  for cough and chest tightness.   Cardiovascular: Positive for chest pain. Negative for leg swelling.       Increased heart rate.    Gastrointestinal: Negative for abdominal pain, diarrhea, nausea and vomiting.       Difficulty swallowing.    Genitourinary: Negative for difficulty urinating and dysuria.  Musculoskeletal:       Aching and discomfort - weakness.    Skin: Negative for color change and rash.  Neurological: Negative for dizziness, light-headedness and headaches.  Psychiatric/Behavioral: Negative for agitation and dysphoric mood.       Objective:    Physical Exam Vitals reviewed.  Constitutional:      General: She is not in acute distress.    Appearance: Normal appearance.  HENT:     Head: Normocephalic and atraumatic.     Right Ear: External ear normal.     Left Ear: External ear normal.  Eyes:     General: No scleral icterus.       Right eye: No discharge.        Left eye: No discharge.     Conjunctiva/sclera: Conjunctivae normal.  Neck:     Thyroid: No thyromegaly.  Cardiovascular:     Rate and Rhythm: Normal rate and regular rhythm.  Pulmonary:     Effort: No respiratory distress.     Breath sounds: Normal breath sounds. No wheezing.  Abdominal:     General: Bowel sounds are normal.     Palpations: Abdomen is soft.     Tenderness: There is no abdominal tenderness.  Musculoskeletal:        General: No swelling or tenderness.     Cervical back: Neck supple. No tenderness.     Comments: Motor strength equal bilateral lower extremities.    Lymphadenopathy:     Cervical: No cervical adenopathy.  Skin:    Findings: No erythema or rash.  Neurological:     Mental Status: She is alert.  Psychiatric:        Mood and Affect: Mood normal.        Behavior: Behavior normal.     BP 106/70   Pulse 75   Temp 97.8 F (36.6 C)   Resp 16   Ht 5\' 5"  (1.651 m)   Wt 165 lb (74.8 kg)   SpO2 99%   BMI 27.46 kg/m  Wt Readings from Last 3 Encounters:  02/17/20  165 lb (74.8 kg)  02/11/20 164 lb 3.2 oz (74.5 kg)  01/29/20 166 lb (75.3 kg)     Lab Results  Component Value Date   WBC 6.6 01/29/2020   HGB 13.2 01/29/2020   HCT 39.5 01/29/2020   PLT 291.0 01/29/2020   GLUCOSE 88 01/29/2020   ALT 16 01/29/2020   AST 18 01/29/2020   NA 136 01/29/2020   K 4.3 01/29/2020  CL 104 01/29/2020   CREATININE 0.69 01/29/2020   BUN 11 01/29/2020   CO2 28 01/29/2020   TSH 1.51 01/29/2020    ECHOCARDIOGRAM COMPLETE  Result Date: 02/17/2020    ECHOCARDIOGRAM REPORT   Patient Name:   DENYA BUCKINGHAM [facility] Date of Exam: 02/17/2020 Medical Rec #:  [id number]         Height:       65.0 in Accession #:    [id number]        Weight:       164.2 lb Date of Birth:  1981/06/23         BSA:          1.819 m Patient Age:    38 years          BP:           112/66 mmHg Patient Gender: F                 HR:           75 bpm. Exam Location:  [facility] Procedure: 2D Echo, Color Doppler and Cardiac Doppler Indications:     R06.00 Dyspnea  History:         Patient has no prior history of Echocardiogram examinations.                  SOB upon exertion.  Sonographer:     Humphrey Rolls RDCS (AE) Referring Phys:  [id number] Salena Saner Diagnosing Phys: Harold Hedge MD IMPRESSIONS  1. Left ventricular ejection fraction, by estimation, is 65 to 70%. Left ventricular ejection fraction by PLAX is 69 %. The left ventricle has normal function. The left ventricle has no regional wall motion abnormalities. Left ventricular diastolic parameters were normal.  2. Right ventricular systolic function is normal. The right ventricular size is normal.  3. The mitral valve is grossly normal. No evidence of mitral valve regurgitation.  4. The aortic valve is tricuspid. Aortic valve regurgitation is not visualized. FINDINGS  Left Ventricle: Left ventricular ejection fraction, by estimation, is 65 to 70%. Left ventricular ejection fraction by PLAX is 69 %. The left ventricle has normal function. The left ventricle has no  regional wall motion abnormalities. The left ventricular internal cavity size was normal in size. There is no left ventricular hypertrophy. Left ventricular diastolic parameters were normal. Right Ventricle: The right ventricular size is normal. No increase in right ventricular wall thickness. Right ventricular systolic function is normal. Left Atrium: Left atrial size was normal in size. Right Atrium: Right atrial size was normal in size. Pericardium: There is no evidence of pericardial effusion. Mitral Valve: The mitral valve is grossly normal. No evidence of mitral valve regurgitation. MV peak gradient, 3.2 mmHg. The mean mitral valve gradient is 2.0 mmHg. Tricuspid Valve: The tricuspid valve is grossly normal. Tricuspid valve regurgitation is not demonstrated. Aortic Valve: The aortic valve is tricuspid. Aortic valve regurgitation is not visualized. Aortic valve mean gradient measures 3.0 mmHg. Aortic valve peak gradient measures 6.4 mmHg. Aortic valve area, by VTI measures 2.02 cm. Pulmonic Valve: The pulmonic valve was not well visualized. Pulmonic valve regurgitation is not visualized. Aorta: The aortic root is normal in size and structure. IAS/Shunts: The interatrial septum was not assessed.  LEFT VENTRICLE PLAX 2D LV EF:         Left            Diastology  ventricular     LV e' lateral:   15.80 cm/s                ejection        LV E/e' lateral: 4.1                fraction by     LV e' medial:    9.79 cm/s                PLAX is 69      LV E/e' medial:  6.7                %. LVIDd:         4.64 cm LVIDs:         2.85 cm LV PW:         0.81 cm LV IVS:        1.00 cm LVOT diam:     1.80 cm LV SV:         51 LV SV Index:   28 LVOT Area:     2.54 cm  RIGHT VENTRICLE RV Basal diam:  2.72 cm LEFT ATRIUM             Index       RIGHT ATRIUM           Index LA diam:        2.80 cm 1.54 cm/m  RA Area:     13.50 cm LA Vol (A2C):   32.7 ml 17.98 ml/m RA Volume:   34.00 ml  18.69 ml/m LA Vol (A4C):    31.6 ml 17.37 ml/m LA Biplane Vol: 32.7 ml 17.98 ml/m  AORTIC VALVE                   PULMONIC VALVE AV Area (Vmax):    2.20 cm    PV Vmax:       1.08 m/s AV Area (Vmean):   2.10 cm    PV Vmean:      80.100 cm/s AV Area (VTI):     2.02 cm    PV VTI:        0.254 m AV Vmax:           126.00 cm/s PV Peak grad:  4.7 mmHg AV Vmean:          87.900 cm/s PV Mean grad:  3.0 mmHg AV VTI:            0.253 m AV Peak Grad:      6.4 mmHg AV Mean Grad:      3.0 mmHg LVOT Vmax:         109.00 cm/s LVOT Vmean:        72.600 cm/s LVOT VTI:          0.201 m LVOT/AV VTI ratio: 0.79  AORTA Ao Root diam: 2.40 cm MITRAL VALVE MV Area (PHT): 4.89 cm    SHUNTS MV Peak grad:  3.2 mmHg    Systemic VTI:  0.20 m MV Mean grad:  2.0 mmHg    Systemic Diam: 1.80 cm MV Vmax:       0.89 m/s MV Vmean:      58.5 cm/s MV Decel Time: 155 msec MV E velocity: 65.50 cm/s MV A velocity: 63.10 cm/s MV E/A ratio:  1.04 Harold Hedge MD Electronically signed by Harold Hedge MD Signature Date/Time: 02/17/2020/12:52:30 PM    Final        Assessment &  Plan:   Problem List Items Addressed This Visit    Anxiety    Reports has improved.  Tapering cymbalta.  Continues on zoloft.  Continue f/u with psychiatry.       Dysphagia    Discussed swallowing evaluation.  Pulmonary just placed her on prilosec.  Follow.        Elevated blood pressure reading    Varying pressures outside of clinic.  Checks in office ok.  Hold on medication.  Follow.        Fatigue    Persistent as outlined.  W/up to date unrevealing.  Awaiting official echo report and is planning PFTs.  F/u with neurology as outlined.        Hypoglycemia    Saw endocrinology.  Has standing orders if recurs.  Has not been an issue lately.        Muscle ache    Has seen rheumatology and neurology.  Titers negative as outlined.  Negative previous - EMG/NCS.  She requested f/u with neurology and was questioning need for muscle biopsy vs other testing.  Follow.        Muscle  twitching    Twitching and fasciculations continue as outlined.  Some tremors.  Tapering off cymbalta.  Continues on zoloft.  Request referral back to neurology.  Previous EMG/NCS normal.        SOB (shortness of breath)    Currently seeing pulmonary.  PFTs pending.  Just had f/u echo.  Waiting for official results.  Saw cardiology previously as outlined.        Weakness    Persistent with aching/soreness and fatigue as outlined.  Has seen neurology previously.  W/up unrevealing.  Request referral back to neurology to discuss if any further neurological w/up warranted.            Dale Durhamharlene Dalanie Kisner, MD

## 2020-02-17 NOTE — Telephone Encounter (Signed)
Pt has appt today

## 2020-02-22 ENCOUNTER — Encounter: Payer: Self-pay | Admitting: Internal Medicine

## 2020-02-22 NOTE — Assessment & Plan Note (Signed)
Has seen rheumatology and neurology.  Titers negative as outlined.  Negative previous - EMG/NCS.  She requested f/u with neurology and was questioning need for muscle biopsy vs other testing.  Follow.

## 2020-02-22 NOTE — Assessment & Plan Note (Addendum)
Reports has improved.  Tapering cymbalta.  Continues on zoloft.  Continue f/u with psychiatry.

## 2020-02-22 NOTE — Assessment & Plan Note (Signed)
Discussed swallowing evaluation.  Pulmonary just placed her on prilosec.  Follow.

## 2020-02-22 NOTE — Assessment & Plan Note (Signed)
Twitching and fasciculations continue as outlined.  Some tremors.  Tapering off cymbalta.  Continues on zoloft.  Request referral back to neurology.  Previous EMG/NCS normal.

## 2020-02-22 NOTE — Assessment & Plan Note (Signed)
Varying pressures outside of clinic.  Checks in office ok.  Hold on medication.  Follow.

## 2020-02-22 NOTE — Assessment & Plan Note (Signed)
Currently seeing pulmonary.  PFTs pending.  Just had f/u echo.  Waiting for official results.  Saw cardiology previously as outlined.

## 2020-02-22 NOTE — Assessment & Plan Note (Signed)
Saw endocrinology.  Has standing orders if recurs.  Has not been an issue lately.

## 2020-02-22 NOTE — Assessment & Plan Note (Signed)
Persistent as outlined.  W/up to date unrevealing.  Awaiting official echo report and is planning PFTs.  F/u with neurology as outlined.

## 2020-02-22 NOTE — Assessment & Plan Note (Signed)
Persistent with aching/soreness and fatigue as outlined.  Has seen neurology previously.  W/up unrevealing.  Request referral back to neurology to discuss if any further neurological w/up warranted.

## 2020-02-23 ENCOUNTER — Ambulatory Visit: Payer: Self-pay

## 2020-02-24 ENCOUNTER — Encounter: Payer: Self-pay | Admitting: Internal Medicine

## 2020-02-27 ENCOUNTER — Other Ambulatory Visit
Admission: RE | Admit: 2020-02-27 | Discharge: 2020-02-27 | Disposition: A | Discharge status: Home / Self Care | Payer: HRSA Program | Source: Ambulatory Visit | Attending: Pulmonary Disease | Admitting: Pulmonary Disease

## 2020-02-27 ENCOUNTER — Other Ambulatory Visit: Payer: Self-pay

## 2020-02-27 DIAGNOSIS — Z20822 Contact with and (suspected) exposure to covid-19: Secondary | ICD-10-CM | POA: Diagnosis not present

## 2020-02-27 DIAGNOSIS — Z01812 Encounter for preprocedural laboratory examination: Secondary | ICD-10-CM | POA: Insufficient documentation

## 2020-02-27 LAB — SARS CORONAVIRUS 2 (TAT 6-24 HRS): SARS Coronavirus 2: NEGATIVE

## 2020-03-01 ENCOUNTER — Other Ambulatory Visit: Payer: Self-pay

## 2020-03-01 ENCOUNTER — Ambulatory Visit: Payer: Self-pay

## 2020-03-01 ENCOUNTER — Ambulatory Visit: Payer: Self-pay | Attending: Pulmonary Disease

## 2020-03-01 DIAGNOSIS — R0602 Shortness of breath: Secondary | ICD-10-CM | POA: Insufficient documentation

## 2020-03-01 MED ORDER — ALBUTEROL SULFATE (2.5 MG/3ML) 0.083% IN NEBU
2.5000 mg | INHALATION_SOLUTION | Freq: Once | RESPIRATORY_TRACT | Status: DC
  Filled 2020-03-01: qty 3

## 2020-03-02 ENCOUNTER — Ambulatory Visit: Payer: Self-pay | Admitting: Internal Medicine

## 2020-03-10 ENCOUNTER — Other Ambulatory Visit: Payer: Self-pay

## 2020-03-10 ENCOUNTER — Ambulatory Visit (INDEPENDENT_AMBULATORY_CARE_PROVIDER_SITE_OTHER): Payer: Self-pay | Admitting: Internal Medicine

## 2020-03-10 ENCOUNTER — Encounter: Payer: Self-pay | Admitting: Internal Medicine

## 2020-03-10 DIAGNOSIS — R0602 Shortness of breath: Secondary | ICD-10-CM

## 2020-03-10 DIAGNOSIS — R253 Fasciculation: Secondary | ICD-10-CM

## 2020-03-10 DIAGNOSIS — R531 Weakness: Secondary | ICD-10-CM

## 2020-03-10 DIAGNOSIS — R030 Elevated blood-pressure reading, without diagnosis of hypertension: Secondary | ICD-10-CM

## 2020-03-10 DIAGNOSIS — R1310 Dysphagia, unspecified: Secondary | ICD-10-CM

## 2020-03-10 DIAGNOSIS — F419 Anxiety disorder, unspecified: Secondary | ICD-10-CM

## 2020-03-10 NOTE — Progress Notes (Signed)
Patient ID: Tonya Harris, female   DOB: 12/19/1980, 39 y.o.   MRN: [id number]   Subjective:    Patient ID: Tonya Harris, female    DOB: 07-Sep-1981, 39 y.o.   MRN: [id number]  HPI This visit occurred during the SARS-CoV-2 public health emergency.  Safety protocols were in place, including screening questions prior to the visit, additional usage of staff PPE, and extensive cleaning of exam room while observing appropriate contact time as indicated for disinfecting solutions.  Patient here for a scheduled follow up.  Here to follow up regarding her leg aching, difficulty swallowing and fasciculations, ,etc.  She is continuing to report muscle aching and some fatigue and sob.  With minimal activity, states she feels like she has run 10 miles.  Still has muscle twitching, cramping and muscle soreness.  She also describes difficulty swallowing.  Feels like food is sticking when swallows.  No vomiting.  Some nausea.  Reflux controlled on PPI.  Still with fluctuating blood pressure.  03/02/20 - 139/68, 126/72.  Finger tips still get red.  Some peeling of skin - tip of finger.  No swelling.  Has seen cardiology.  W/up unrevealing.  Has seen rheumatology and endocrinology.  Does not appear to be having problems now with sugar dropping.  Has seen neurology - previous NCS normal.  Just reevaluated by neurology.  Planning for NCS end of this week.  Handling stress.  Off cymbalta.  Only on zoloft now.  Has good family support.    Past Medical History:  Diagnosis Date  . Gestational diabetes    Past Surgical History:  Procedure Laterality Date  . APPENDECTOMY     Family History  Problem Relation Age of Onset  . Hypertension Mother   . Hypertension Father    Social History   Socioeconomic History  . Marital status: Married    Spouse name: Not on file  . Number of children: 4  . Years of education: Not on file  . Highest education level: Master's degree (e.g., MA, MS, MEng, MEd, MSW, MBA)   Occupational History  . 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 Smoking status: Never Smoker  . Smokeless tobacco: Never Used  Substance and Sexual Activity  . Alcohol use: Not Currently  . Drug use: Not Currently  . Sexual activity: Yes    Partners: Male  Other Topics Concern  . 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 Difficulty of Paying Living Expenses:   Food Insecurity:   . Worried About Programme researcher, broadcasting/film/video in the Last Year:   . Barista in the Last Year:   Transportation Needs:   . Freight forwarder (Medical):   Marland Kitchen Lack of Transportation (Non-Medical):   Physical Activity:   . Days of Exercise per Week:   . Minutes of Exercise per Session:   Stress:   . Feeling of Stress :   Social Connections:   . Frequency of Communication with Friends and Family:   . Frequency of Social Gatherings with Friends and Family:   . Attends Religious Services:   . Active Member of Clubs or Organizations:   . Attends Banker Meetings:   Marland Kitchen Marital Status:  Outpatient Encounter Medications as of 03/10/2020  Medication Sig  . Multiple Vitamin (MULTIVITAMIN) tablet Take 1 tablet by mouth daily.  Marland Kitchen omeprazole (PRILOSEC) 40 MG capsule Take 1 capsule (40 mg total) by mouth every evening.  . sertraline (ZOLOFT) 100 MG tablet Take 100 mg by mouth daily.  . [DISCONTINUED] DULoxetine (CYMBALTA) 60 MG capsule Take 30 mg by mouth daily.    Facility-Administered Encounter Medications as of 03/10/2020  Medication  . albuterol (PROVENTIL) (2.5 MG/3ML) 0.083% nebulizer solution 2.5 mg    Review of Systems  Constitutional: Positive for fatigue. Negative for appetite change.  HENT: Negative for congestion and sinus pressure.   Respiratory: Negative for cough and chest tightness.         Some sob with increased activity/exertion.    Cardiovascular: Negative for chest pain, palpitations and leg swelling.  Gastrointestinal: Negative for abdominal pain and diarrhea.       Some nausea.  Dysphagia as outlined.    Genitourinary: Negative for difficulty urinating and dysuria.  Musculoskeletal: Negative for joint swelling.       Muscle aching as outlined.    Skin: Negative for color change and rash.  Neurological: Negative for dizziness, light-headedness and headaches.  Psychiatric/Behavioral: Negative for agitation and dysphoric mood.       Objective:    Physical Exam Vitals reviewed.  Constitutional:      General: She is not in acute distress.    Appearance: Normal appearance.  HENT:     Head: Normocephalic and atraumatic.     Right Ear: External ear normal.     Left Ear: External ear normal.  Eyes:     General: No scleral icterus.       Right eye: No discharge.        Left eye: No discharge.     Conjunctiva/sclera: Conjunctivae normal.  Neck:     Thyroid: No thyromegaly.  Cardiovascular:     Rate and Rhythm: Normal rate and regular rhythm.  Pulmonary:     Effort: No respiratory distress.     Breath sounds: Normal breath sounds. No wheezing.  Abdominal:     General: Bowel sounds are normal.     Palpations: Abdomen is soft.     Tenderness: There is no abdominal tenderness.  Musculoskeletal:        General: No swelling or tenderness.     Cervical back: Neck supple. No tenderness.  Lymphadenopathy:     Cervical: No cervical adenopathy.  Skin:    Findings: No erythema or rash.  Neurological:     Mental Status: She is alert.  Psychiatric:        Mood and Affect: Mood normal.        Behavior: Behavior normal.     BP 118/70   Pulse 71   Temp 97.9 F (36.6 C)   Resp 16   Ht 5\' 5"  (1.651 m)   Wt 165 lb (74.8 kg)   SpO2 99%   BMI 27.46 kg/m  Wt Readings from Last 3 Encounters:  03/10/20 165 lb (74.8 kg)  02/17/20 165 lb (74.8 kg)  02/11/20 164 lb  3.2 oz (74.5 kg)     Lab Results  Component Value Date   WBC 6.6 01/29/2020   HGB 13.2 01/29/2020   HCT 39.5 01/29/2020   PLT 291.0 01/29/2020   GLUCOSE 88 01/29/2020   ALT 16 01/29/2020   AST 18 01/29/2020   NA 136 01/29/2020   K 4.3 01/29/2020   CL 104 01/29/2020  CREATININE 0.69 01/29/2020   BUN 11 01/29/2020   CO2 28 01/29/2020   TSH 1.51 01/29/2020       Assessment & Plan:   Problem List Items Addressed This Visit    Anxiety    Seeing Dr Maryruth Bun.  Off cymbalta.  On zoloft.  Has good support.        Dysphagia    Persistent issue for her as outlined.  Discussed swallowing evaluation.  Wants to pursue above w/up first.  Will notify me if desires to schedule further testing.        Elevated blood pressure reading    Fluctuating blood pressures.  Hold on medication.  Follow pressures.        Muscle twitching    Just reevaluated by neurology. Planning NCS/EMG this week.        SOB (shortness of breath)    SOB with exertion as outlined.  Cardiology w/up unrevealing.  Saw pulmonary.  W/up in progress.  Planning for PFTs.        Weakness    Persistent with aching and soreness/fatigue.  No weakness noted on exam.  Has seen neurology recently.  Planning for NCS this week.            Dale [place], MD

## 2020-03-21 ENCOUNTER — Encounter: Payer: Self-pay | Admitting: Internal Medicine

## 2020-03-21 NOTE — Assessment & Plan Note (Signed)
Seeing Dr Maryruth Bun.  Off cymbalta.  On zoloft.  Has good support.

## 2020-03-21 NOTE — Assessment & Plan Note (Signed)
Fluctuating blood pressures.  Hold on medication.  Follow pressures.

## 2020-03-21 NOTE — Assessment & Plan Note (Signed)
Persistent with aching and soreness/fatigue.  No weakness noted on exam.  Has seen neurology recently.  Planning for NCS this week.

## 2020-03-21 NOTE — Assessment & Plan Note (Signed)
SOB with exertion as outlined.  Cardiology w/up unrevealing.  Saw pulmonary.  W/up in progress.  Planning for PFTs.

## 2020-03-21 NOTE — Assessment & Plan Note (Signed)
Persistent issue for her as outlined.  Discussed swallowing evaluation.  Wants to pursue above w/up first.  Will notify me if desires to schedule further testing.

## 2020-03-21 NOTE — Assessment & Plan Note (Signed)
Just reevaluated by neurology. Planning NCS/EMG this week.

## 2020-03-22 ENCOUNTER — Ambulatory Visit (INDEPENDENT_AMBULATORY_CARE_PROVIDER_SITE_OTHER): Payer: Self-pay | Admitting: Pulmonary Disease

## 2020-03-22 ENCOUNTER — Encounter: Payer: Self-pay | Admitting: Pulmonary Disease

## 2020-03-22 ENCOUNTER — Other Ambulatory Visit: Payer: Self-pay

## 2020-03-22 VITALS — BP 122/76 | HR 81 | Temp 98.0°F | Ht 65.0 in | Wt 165.0 lb

## 2020-03-22 DIAGNOSIS — K219 Gastro-esophageal reflux disease without esophagitis: Secondary | ICD-10-CM

## 2020-03-22 DIAGNOSIS — J4530 Mild persistent asthma, uncomplicated: Secondary | ICD-10-CM

## 2020-03-22 DIAGNOSIS — R0602 Shortness of breath: Secondary | ICD-10-CM

## 2020-03-22 DIAGNOSIS — I7300 Raynaud's syndrome without gangrene: Secondary | ICD-10-CM

## 2020-03-22 MED ORDER — LEVALBUTEROL TARTRATE 45 MCG/ACT IN AERO: 2.0000 | INHALATION_SPRAY | Freq: Three times a day (TID) | RESPIRATORY_TRACT | 12 refills | Status: DC | PRN

## 2020-03-22 MED ORDER — BREO ELLIPTA 100-25 MCG/INH IN AEPB: 1.0000 | INHALATION_SPRAY | Freq: Every day | RESPIRATORY_TRACT | 0 refills | Status: DC

## 2020-03-22 MED ORDER — BREO ELLIPTA 100-25 MCG/INH IN AEPB: 1.0000 | INHALATION_SPRAY | Freq: Every day | RESPIRATORY_TRACT | 6 refills | Status: DC

## 2020-03-22 NOTE — Patient Instructions (Addendum)
Trial of Breo Ellipta 1 inhalation daily.  You will also get Xopenex as a rescue inhaler he can use 2 puffs 3 times a day as needed for shortness of breath and/or wheezing.  You have mild asthma.  I do not believe that this is causing all of your symptoms but managing it may help some of your issues with shortness of breath.   Recommend exercises such as yoga or tai chi that can help with developing a good deep breathing technique.   We will see you in follow-up in 2 months time call sooner should any new difficulties arise.

## 2020-03-22 NOTE — Progress Notes (Signed)
Subjective:    Patient ID: Tonya Harris, female    DOB: Dec 29, 1980, 39 y.o.   MRN: [id number]  HPI Tonya Harris is a 39 year old lifelong never smoker, who presents for follow-up on the issue of dyspnea, chest discomfort and fatigue.  He was initially evaluated on 11 February 2020.  At that time we requested an echocardiogram and PFTs to evaluate her symptoms.  The results are as noted below.  She does have mild obstructive lung defect with airways reversibility with bronchodilator.  She does not endorse any new symptoms from prior.  She continues to have vague symptoms of generalized muscle weakness, fatigue, anxiety, palpitations and random muscle fasciculations.  She also exhibits Raynaud's phenomena.  She is being evaluated by various specialists to include neurology, psychiatry and rheumatology.  Her echocardiogram shows mild increase hyperdynamic left ventricle but otherwise normal.  PFTs showed findings as below.  She did exhibit issues with gastroesophageal reflux previously however these are now controlled with omeprazole.  Data obtained: 02/17/2020: 2D echocardiogram>> EF 65 to 70% normal left ventricular wall motion.  Somewhat hyperdynamic.  Otherwise no valvular disease or abnormalities. 03/01/2020: PFTs>> FEV1 3.12 L or 98% predicted with significant bronchodilator response with an net change of 16%.  FVC 4.19 L or 108% predicted.  FEV1/FVC 75%.  There is mild hyperinflation and moderate air trapping consistent with obstructive airways disease of mild nature.  Diffusion capacity normal.   Review of Systems A 10 point review of systems was performed and it is as noted above otherwise negative.    Objective:   Physical Exam BP 122/76 (BP Location: Left Arm, Cuff Size: Normal)   Pulse 81   Temp 98 F (36.7 C) (Temporal)   Ht 5\' 5"  (1.651 m)   Wt 165 lb (74.8 kg)   SpO2 99%   BMI 27.46 kg/m  GENERAL: Well-developed, well-nourished, not as anxious today, no respiratory distress.   Fully ambulatory. HEAD: Normocephalic, atraumatic.  EYES: Pupils equal, round, reactive to light.  No scleral icterus.  MOUTH: Nose/mouth/throat not examined due to masking requirements for COVID 19. NECK: Supple. No thyromegaly. No JVD.Trachea midline, no crepitus. PULMONARY: Lungs clear to auscultation bilaterally.  Good air entry bilaterally. CARDIOVASCULAR: S1 and S2. Regular rate and rhythm.    No rubs murmurs gallops heard. GASTROINTESTINAL: Abdomen is soft. MUSCULOSKELETAL: No joint deformity, no clubbing, no edema.  Nailbeds pink. NEUROLOGIC: No focal deficit noted.  Awake, alert, oriented, speech is fluent.   SKIN: Intact,warm,dry.  Limited exam shows no rashes.  No evidence of Raynaud's. PSYCH: Anxious.  Behavior otherwise normal.     Assessment & Plan:     ICD-10-CM   1. Mild persistent asthma without complication  J45.30    Mild, 16% net change  on FEV1 after bronchodilator  2. SOB (shortness of breath)  R06.02   3. Gastroesophageal reflux disease, unspecified whether esophagitis present  K21.9    Continue omeprazole  4. 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  May be idiopathic.   Meds ordered this encounter  Medications  . fluticasone furoate-vilanterol (BREO ELLIPTA) 100-25 MCG/INH AEPB    Sig: Inhale 1 puff into the lungs daily.    Dispense:  1 each    Refill:  6  . levalbuterol (XOPENEX HFA) 45 MCG/ACT inhaler    Sig: Inhale 2 puffs into the lungs every 8 (eight) hours as needed for wheezing or shortness of breath.    Dispense:  1 Inhaler  Refill:  12  . fluticasone furoate-vilanterol (BREO ELLIPTA) 100-25 MCG/INH AEPB    Sig: Inhale 1 puff into the lungs daily.    Dispense:  28 each    Refill:  0    Order Specific Question:   Lot Number?    Answer:   [id number]    Order Specific Question:   Expiration Date?    Answer:   09/05/2020    Order Specific Question:   Manufacturer?    Answer:   [employer] [[id number]]     Order Specific Question:   Quantity    Answer:   1   Discussion: She has very mild persistent asthma, will treat with Breo Ellipta 100/25, 1 inhalation daily.  We will also provide her with Xopenex as rescue.  She was taught the proper use of the dry powder inhaler and MDI.  She is to contact us if she has any difficulties with these inhalers.  Continue omeprazole for now.  Renold Don, MD [facility]  *This note was dictated using voice recognition software/Dragon.  Despite best efforts to proofread, errors can occur which can change the meaning.  Any change was purely unintentional.

## 2020-03-30 ENCOUNTER — Ambulatory Visit: Payer: Self-pay | Admitting: Internal Medicine

## 2020-04-12 ENCOUNTER — Ambulatory Visit: Payer: Self-pay | Admitting: Internal Medicine

## 2020-04-26 ENCOUNTER — Ambulatory Visit: Payer: Self-pay | Admitting: Internal Medicine

## 2020-04-29 ENCOUNTER — Ambulatory Visit (INDEPENDENT_AMBULATORY_CARE_PROVIDER_SITE_OTHER): Payer: Self-pay | Admitting: Internal Medicine

## 2020-04-29 ENCOUNTER — Other Ambulatory Visit: Payer: Self-pay

## 2020-04-29 DIAGNOSIS — R531 Weakness: Secondary | ICD-10-CM

## 2020-04-29 DIAGNOSIS — E162 Hypoglycemia, unspecified: Secondary | ICD-10-CM

## 2020-04-29 DIAGNOSIS — R1310 Dysphagia, unspecified: Secondary | ICD-10-CM

## 2020-04-29 DIAGNOSIS — R253 Fasciculation: Secondary | ICD-10-CM

## 2020-04-29 NOTE — Progress Notes (Signed)
Patient ID: Tonya Harris, female   DOB: Apr 05, 1981, 39 y.o.   MRN: [id number]   Subjective:    Patient ID: Tonya Harris, female    DOB: 02-Sep-1981, 39 y.o.   MRN: [id number]  HPI This visit occurred during the SARS-CoV-2 public health emergency.  Safety protocols were in place, including screening questions prior to the visit, additional usage of staff PPE, and extensive cleaning of exam room while observing appropriate contact time as indicated for disinfecting solutions.  Patient here for a scheduled follow up.  Recently evaluated by neurology.  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  Tries to stay active.  Describes weakness - overall weakness.  Having tremors.  Still having issues with swallowing. Delay in food going down.  Notices when she talks a lot, "voice will go".  Also clears her throat with increased talking.  Increased stress related to above.  Does not feel anxious.  On zoloft.  Followed by Dr Maryruth Bun.    Past Medical History:  Diagnosis Date  . Gestational diabetes    Past Surgical History:  Procedure Laterality Date  . APPENDECTOMY     Family History  Problem Relation Age of Onset  . Hypertension Mother   . Hypertension Father    Social History   Socioeconomic History  . Marital status: Married    Spouse name: Not on file  . Number of children: 4  . Years of education: Not on file  . Highest education level: Master's degree (e.g., MA, MS, MEng, MEd, MSW, MBA)  Occupational History  . 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 Smoking status: Never Smoker  . Smokeless tobacco: Never Used  Vaping Use  . Vaping Use: Never used  Substance and Sexual Activity  . Alcohol use: Not Currently  . Drug use: Not Currently  . Sexual activity: Yes    Partners: Male  Other Topics Concern  . 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 Difficulty of Paying Living Expenses:   Food Insecurity:   . Worried About Programme researcher, broadcasting/film/video in the Last Year:   . Barista in the Last Year:   Transportation Needs:   . Freight forwarder (Medical):   Marland Kitchen Lack of Transportation (Non-Medical):   Physical Activity:   . Days of Exercise per Week:   . Minutes of Exercise per Session:   Stress:   . Feeling of Stress :   Social Connections:   . Frequency of Communication with Friends and Family:   . Frequency of Social Gatherings with Friends and Family:   . Attends Religious Services:   . Active Member of Clubs or Organizations:   . Attends Banker Meetings:   Marland Kitchen Marital Status:     Outpatient Encounter Medications as of 04/29/2020  Medication Sig  . fluticasone furoate-vilanterol (BREO ELLIPTA) 100-25 MCG/INH AEPB Inhale 1 puff into the lungs daily.  . fluticasone furoate-vilanterol (BREO ELLIPTA) 100-25 MCG/INH AEPB Inhale 1 puff into the lungs daily.  Marland Kitchen levalbuterol (XOPENEX HFA) 45 MCG/ACT inhaler Inhale 2 puffs into the lungs every 8 (eight) hours as needed for wheezing or shortness of breath.  . Multiple Vitamin (MULTIVITAMIN) tablet  Take 1 tablet by mouth daily.  Marland Kitchen omeprazole (PRILOSEC) 40 MG capsule Take 1 capsule (40 mg total) by mouth every evening.  . sertraline (ZOLOFT) 100 MG tablet Take 100 mg by mouth daily.   No facility-administered encounter medications on file as of 04/29/2020.    Review of Systems  Constitutional: Negative for appetite change.       Weight down several pounds when compared to the previous check.    HENT: Negative for congestion and sinus pressure.   Respiratory: Negative for cough and chest tightness.   Cardiovascular: Negative for chest pain and leg swelling.  Gastrointestinal: Negative for abdominal pain, nausea and vomiting.  Genitourinary: Negative for  difficulty urinating and dysuria.  Musculoskeletal:       Aching and weakness as outlined.    Skin: Negative for color change and rash.  Neurological: Negative for dizziness, light-headedness and headaches.  Psychiatric/Behavioral: Negative for agitation and dysphoric mood.       Objective:    Physical Exam Vitals reviewed.  Constitutional:      General: She is not in acute distress.    Appearance: Normal appearance.  HENT:     Head: Normocephalic and atraumatic.     Right Ear: External ear normal.     Left Ear: External ear normal.  Eyes:     General: No scleral icterus.       Right eye: No discharge.        Left eye: No discharge.     Conjunctiva/sclera: Conjunctivae normal.  Neck:     Thyroid: No thyromegaly.  Cardiovascular:     Rate and Rhythm: Normal rate and regular rhythm.  Pulmonary:     Effort: No respiratory distress.     Breath sounds: Normal breath sounds. No wheezing.  Abdominal:     General: Bowel sounds are normal.     Palpations: Abdomen is soft.     Tenderness: There is no abdominal tenderness.  Musculoskeletal:        General: No swelling or tenderness.     Cervical back: Neck supple. No tenderness.  Lymphadenopathy:     Cervical: No cervical adenopathy.  Skin:    Findings: No erythema or rash.  Neurological:     Mental Status: She is alert.  Psychiatric:        Mood and Affect: Mood normal.        Behavior: Behavior normal.     BP 116/70   Pulse 83   Temp (!) 96.4 F (35.8 C)   Resp 16   Ht 5\' 5"  (1.651 m)   Wt 160 lb (72.6 kg)   SpO2 99%   BMI 26.63 kg/m  Wt Readings from Last 3 Encounters:  04/29/20 160 lb (72.6 kg)  03/22/20 165 lb (74.8 kg)  03/10/20 165 lb (74.8 kg)     Lab Results  Component Value Date   WBC 6.6 01/29/2020   HGB 13.2 01/29/2020   HCT 39.5 01/29/2020   PLT 291.0 01/29/2020   GLUCOSE 88 01/29/2020   ALT 16 01/29/2020   AST 18 01/29/2020   NA 136 01/29/2020   K 4.3 01/29/2020   CL 104 01/29/2020    CREATININE 0.69 01/29/2020   BUN 11 01/29/2020   CO2 28 01/29/2020   TSH 1.51 01/29/2020       Assessment & Plan:   Problem List Items Addressed This Visit    Dysphagia    Persistent problems with swallowing.  Feels delay in going down.  Clearing  throat.  On prilosec.  Schedule barium swallow and UGI.        Hypoglycemia    Saw endocrinology.  Has standing orders if recurs.        Muscle twitching    Evaluated by neurology.  Diagnosed with benign fasciculations syndrome.        Weakness    Described as "all over weakness".  Seeing neurology.  Diagnosed with benign fasciculations syndrome.  Note reviewed.  Previously worked up by cardiology - negative w/up.           Dale [place], MD

## 2020-05-08 ENCOUNTER — Other Ambulatory Visit: Payer: Self-pay | Admitting: Pulmonary Disease

## 2020-05-09 ENCOUNTER — Encounter: Payer: Self-pay | Admitting: Internal Medicine

## 2020-05-09 NOTE — Assessment & Plan Note (Addendum)
Described as "all over weakness".  Seeing neurology.  Diagnosed with benign fasciculations syndrome.  Note reviewed.  Previously worked up by cardiology - negative w/up.

## 2020-05-09 NOTE — Assessment & Plan Note (Signed)
Evaluated by neurology.  Diagnosed with benign fasciculations syndrome.

## 2020-05-09 NOTE — Assessment & Plan Note (Addendum)
Saw endocrinology.  Has standing orders if recurs.

## 2020-05-09 NOTE — Assessment & Plan Note (Signed)
Persistent problems with swallowing.  Feels delay in going down.  Clearing throat.  On prilosec.  Schedule barium swallow and UGI.

## 2020-05-12 ENCOUNTER — Ambulatory Visit: Payer: Self-pay | Admitting: Internal Medicine

## 2020-05-24 ENCOUNTER — Ambulatory Visit (INDEPENDENT_AMBULATORY_CARE_PROVIDER_SITE_OTHER): Payer: Self-pay | Admitting: Pulmonary Disease

## 2020-05-24 ENCOUNTER — Encounter: Payer: Self-pay | Admitting: Pulmonary Disease

## 2020-05-24 ENCOUNTER — Other Ambulatory Visit: Payer: Self-pay

## 2020-05-24 VITALS — BP 120/76 | HR 73 | Temp 97.7°F | Ht 65.0 in | Wt 160.8 lb

## 2020-05-24 DIAGNOSIS — K219 Gastro-esophageal reflux disease without esophagitis: Secondary | ICD-10-CM

## 2020-05-24 DIAGNOSIS — I7300 Raynaud's syndrome without gangrene: Secondary | ICD-10-CM

## 2020-05-24 DIAGNOSIS — R0602 Shortness of breath: Secondary | ICD-10-CM

## 2020-05-24 DIAGNOSIS — J4530 Mild persistent asthma, uncomplicated: Secondary | ICD-10-CM

## 2020-05-24 MED ORDER — TRELEGY ELLIPTA 100-62.5-25 MCG/INH IN AEPB: 1.0000 | INHALATION_SPRAY | Freq: Every day | RESPIRATORY_TRACT | 0 refills | Status: AC

## 2020-05-24 NOTE — Patient Instructions (Addendum)
We will give Trelegy Ellipta 100 a try.  Do not use Breo while taking the Trelegy.  Make sure you rinse your mouth well after you use the Trelegy.  Let us know if the Trelegy works better for you and we will call it into your pharmacy.  Your sample should last about 2 weeks.  We will see him in follow-up in 4 to 6 weeks time call sooner should any new difficulties arise.

## 2020-05-24 NOTE — Progress Notes (Signed)
Subjective:    Patient ID: Tonya Harris, female    DOB: 03-05-1981, 39 y.o.   MRN: [id number]  HPI Bergen is a 39 year old lifelong never smoker, who presents for follow-up on the issue of dyspnea, chest discomfort and fatigue.  He was initially evaluated on 11 February 2020.  Most recent visit was on 22 Mar 2020. At that time she was given a trial of Breo Ellipta 100/25 1 inhalation daily. She does have mild obstructive lung defect with airways reversibility with bronchodilator on PFTs.  She does not endorse any new symptoms from prior.  She continues to have vague symptoms of generalized muscle weakness, fatigue, anxiety, palpitations and random muscle fasciculations.  She also exhibits Raynaud's phenomena. She is being evaluated by various specialists to include neurology, psychiatry and rheumatology.  She has also intermittent atypical chest pain.  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  He has not had any fevers, chills or sweats.  No other new symptomatology.  Data: 02/17/2020: 2D echocardiogram>> EF 65 to 70% normal left ventricular wall motion.  Somewhat hyperdynamic.  Otherwise no valvular disease or abnormalities. 03/01/2020: PFTs>> FEV1 3.12 L or 98% predicted with significant bronchodilator response with an net change of 16%.  FVC 4.19 L or 108% predicted.  FEV1/FVC 75%.  There is mild hyperinflation and moderate air trapping consistent with obstructive airways disease of mild nature.  Diffusion capacity normal.   Review of Systems A 10 point review of systems was performed and it is as noted above otherwise negative.  No Known Allergies   Current Meds  Medication Sig   fluticasone furoate-vilanterol (BREO ELLIPTA) 100-25 MCG/INH AEPB Inhale 1 puff into the lungs daily.   levalbuterol (XOPENEX HFA) 45 MCG/ACT inhaler Inhale 2 puffs  into the lungs every 8 (eight) hours as needed for wheezing or shortness of breath.   Multiple Vitamin (MULTIVITAMIN) tablet Take 1 tablet by mouth daily.   omeprazole (PRILOSEC) 40 MG capsule TAKE 1 CAPSULE (40 MG TOTAL) BY MOUTH EVERY EVENING.   Immunization History  Administered Date(s) Administered   Influenza Split 08/07/2015   Influenza,inj,Quad PF,6+ Mos 08/13/2019   PFIZER SARS-COV-2 Vaccination 01/23/2020, 02/23/2020   Tdap 04/06/2017       Objective:   Physical Exam BP 120/76 (BP Location: Left Arm, Cuff Size: Normal)    Pulse 73    Temp 97.7 F (36.5 C) (Temporal)    Ht 5\' 5"  (1.651 m)    Wt 160 lb 12.8 oz (72.9 kg)    SpO2 99%    BMI 26.76 kg/m   GENERAL: Well-developed, well-nourished, not as anxious today, no respiratory distress.  Fully ambulatory. HEAD: Normocephalic, atraumatic.  EYES: Pupils equal, round, reactive to light. No scleral icterus.  MOUTH: Nose/mouth/throat not examined due to masking requirements for COVID 19. NECK: Supple. No thyromegaly. No JVD.Trachea midline, no crepitus. PULMONARY: Lungs clear to auscultation bilaterally. Good air entry bilaterally. CARDIOVASCULAR: S1 and S2. Regular rate and rhythm.   No rubs murmurs gallops heard. GASTROINTESTINAL: Abdomen is soft. MUSCULOSKELETAL: No joint deformity, no clubbing, no edema. Nailbeds pink. NEUROLOGIC: No focal deficit noted.  Awake, alert, oriented, speech is fluent.   SKIN: Intact,warm,dry. Limited exam shows no rashes.  No evidence of Raynaud's. PSYCH: Mood and behavior normal    Assessment & Plan:     ICD-10-CM   1. Mild persistent asthma without complication  J45.30  Clinically appears well controlled however patient feels that her symptoms persist We will give her a trial of Trelegy Ellipta Withhold Breo while on Trelegy   2. SOB (shortness of breath)  R06.02    Trial of Trelegy as above Her asthma is very mild  3. Gastroesophageal reflux disease, unspecified whether  esophagitis present  K21.9    Continue omeprazole and antireflux measures  4. Raynaud's phenomenon without gangrene  I73.00    This issue adds complexity to her management   Meds ordered this encounter  Medications   Fluticasone-Umeclidin-Vilant (TRELEGY ELLIPTA) 100-62.5-25 MCG/INH AEPB    Sig: Inhale 1 puff into the lungs daily for 1 day.    Dispense:  14 each    Refill:  0    Order Specific Question:   Lot Number?    Answer:   [id number]    Order Specific Question:   Expiration Date?    Answer:   08/06/2021    Order Specific Question:   Manufacturer?    Answer:   [employer] [[id number]]    Order Specific Question:   Quantity    Answer:   1   Discussion:  Patient has mild persistent asthma however her symptomatology is out of proportion to her very mild defect on PFTs.  We will give her a short trial of Trelegy Ellipta to see if this helps with her sensation of dyspnea.  She is to continue Xopenex as rescue.  Continue follow-up with her multiple subspecialists. We will see her in follow-up is to contact us prior to that time should any new difficulties arise.   Gailen Shelter, MD [facility]   *This note was dictated using voice recognition software/Dragon.  Despite best efforts to proofread, errors can occur which can change the meaning.  Any change was purely unintentional.

## 2020-05-25 ENCOUNTER — Encounter: Payer: Self-pay | Admitting: Internal Medicine

## 2020-05-26 ENCOUNTER — Other Ambulatory Visit: Payer: Self-pay | Admitting: Internal Medicine

## 2020-05-26 ENCOUNTER — Telehealth: Payer: Self-pay | Admitting: Internal Medicine

## 2020-05-26 NOTE — Progress Notes (Signed)
Opened in error

## 2020-05-26 NOTE — Telephone Encounter (Signed)
I have placed an order for a swallowing evaluation (esophageal test).  She is agreeable for this to be scheduled.  Please schedule.  Thank you.    Dr Lorin Picket

## 2020-06-21 ENCOUNTER — Telehealth: Payer: Self-pay | Admitting: Internal Medicine

## 2020-06-21 ENCOUNTER — Telehealth (INDEPENDENT_AMBULATORY_CARE_PROVIDER_SITE_OTHER): Payer: Self-pay | Admitting: Internal Medicine

## 2020-06-21 VITALS — Ht 65.0 in | Wt 160.0 lb

## 2020-06-21 DIAGNOSIS — F419 Anxiety disorder, unspecified: Secondary | ICD-10-CM

## 2020-06-21 DIAGNOSIS — J4530 Mild persistent asthma, uncomplicated: Secondary | ICD-10-CM

## 2020-06-21 DIAGNOSIS — R1310 Dysphagia, unspecified: Secondary | ICD-10-CM

## 2020-06-21 DIAGNOSIS — E162 Hypoglycemia, unspecified: Secondary | ICD-10-CM

## 2020-06-21 DIAGNOSIS — R253 Fasciculation: Secondary | ICD-10-CM

## 2020-06-21 NOTE — Telephone Encounter (Signed)
Left message pt needs 3 month physical.

## 2020-06-21 NOTE — Progress Notes (Signed)
Patient ID: Tonya Harris, female   DOB: August 02, 1981, 39 y.o.   MRN: [id number]   Virtual Visit via telephone Note  This visit type was conducted due to national recommendations for restrictions regarding the COVID-19 pandemic (e.g. social distancing).  This format is felt to be most appropriate for this patient at this time.  All issues noted in this document were discussed and addressed.  No physical exam was performed (except for noted visual exam findings with Video Visits).   I connected with [name] by telephone and verified that I am speaking with the correct person using two identifiers. Location patient: home Location provider: work Persons participating in the telephone visit: patient, provider  The limitations, risks, security and privacy concerns of performing an evaluation and management service by telephone and the availability of in person appointments have been discussed.  It has also been discussed with the patient that there may be a patient responsible charge related to this service. The patient expressed understanding and agreed to proceed.   Reason for visit: scheduled follow up  HPI: Has been followed and evaluated by neurology, cardiology, pulmonary and rheumatology.  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  Note reviewed.  She does describe being more stiff.  Takes a while to "walk it out".  Still with twitching - notices more at night.  Had trouble falling asleep.  Legs twitching, feels like bugs crawling. Noticed this more after walking more.   Is not exercising as much.  Previous ECHO 02/17/20 - EF 65-70%.  No valvular disease. Pulmonary diagnosed with mild asthma.  Trial initially with Breo and then trelegy.  Used inhalers - felt worse in general.  Off now.  On omeprazole - for GERD.  She does reports some intermittent nausea.  Gets car sick easily.  Increased gag reflex.  Feels like things getting caught up in the bottom of her  throat.  Some issues with swallowing as outlined previously.  Have discussed swallowing evaluation.  Is ordered.  Need to get scheduled.  Some occasional emesis.  Is eating.  No increased abdominal pain or bowel issue.     ROS: See pertinent positives and negatives per HPI.  Past Medical History:  Diagnosis Date  . Gestational diabetes     Past Surgical History:  Procedure Laterality Date  . APPENDECTOMY      Family History  Problem Relation Age of Onset  . Hypertension Mother   . Hypertension Father     SOCIAL HX: reviewed.    Current Outpatient Medications:  .  fluticasone furoate-vilanterol (BREO ELLIPTA) 100-25 MCG/INH AEPB, Inhale 1 puff into the lungs daily., Disp: 1 each, Rfl: 6 .  levalbuterol (XOPENEX HFA) 45 MCG/ACT inhaler, Inhale 2 puffs into the lungs every 8 (eight) hours as needed for wheezing or shortness of breath., Disp: 1 Inhaler, Rfl: 12 .  Multiple Vitamin (MULTIVITAMIN) tablet, Take 1 tablet by mouth daily., Disp: , Rfl:  .  omeprazole (PRILOSEC) 40 MG capsule, TAKE 1 CAPSULE (40 MG TOTAL) BY MOUTH EVERY EVENING., Disp: 90 capsule, Rfl: 1  EXAM:  GENERAL: alert. Sounds to be in no acute distress.  Answering questions appropriately.    PSYCH/NEURO: pleasant and cooperative, no obvious depression or anxiety, speech and thought processing grossly intact  ASSESSMENT AND PLAN:  Discussed the following assessment and plan:  Dysphagia Persistent swallowing issues as outlined.  Schedule barium swallow and UGI.  Continues prilosec.  No increased acid reflux.    Anxiety Has seen Dr  Maryruth Bun.  On no medication now.  Overall she feels anxiety is better.  Follow.    Hypoglycemia Saw endocrinology.  Has standing orders if recurs.  Has glucometer. Does not appear to be an issue now.   Muscle twitching Evaluated by neurology as outlined.  Diagnosed with benign fasciculations syndrome.    Asthma, mild persistent Seeing pulmonary.  Had trial of breo and trelegy.   Intolerant.  Not on inhalers now.  Continue to treat GERD.      I discussed the assessment and treatment plan with the patient. The patient was provided an opportunity to ask questions and all were answered. The patient agreed with the plan and demonstrated an understanding of the instructions.   The patient was advised to call back or seek an in-person evaluation if the symptoms worsen or if the condition fails to improve as anticipated.  I provided 25 minutes of non-face-to-face time during this encounter.   Dale [place], MD

## 2020-06-28 ENCOUNTER — Telehealth: Payer: Self-pay | Admitting: Internal Medicine

## 2020-06-28 ENCOUNTER — Encounter: Payer: Self-pay | Admitting: Internal Medicine

## 2020-06-28 DIAGNOSIS — J4530 Mild persistent asthma, uncomplicated: Secondary | ICD-10-CM | POA: Insufficient documentation

## 2020-06-28 NOTE — Telephone Encounter (Signed)
I have ordered the esophageal study.  Sent a note in 05/2020 - she is wanting scheduled.  Can we get this scheduled for her.  Thank you.

## 2020-06-28 NOTE — Assessment & Plan Note (Signed)
Evaluated by neurology as outlined.  Diagnosed with benign fasciculations syndrome.

## 2020-06-28 NOTE — Assessment & Plan Note (Signed)
Saw endocrinology.  Has standing orders if recurs.  Has glucometer. Does not appear to be an issue now.

## 2020-06-28 NOTE — Assessment & Plan Note (Signed)
Persistent swallowing issues as outlined.  Schedule barium swallow and UGI.  Continues prilosec.  No increased acid reflux.

## 2020-06-28 NOTE — Assessment & Plan Note (Signed)
Seeing pulmonary.  Had trial of breo and trelegy.  Intolerant.  Not on inhalers now.  Continue to treat GERD.

## 2020-06-28 NOTE — Assessment & Plan Note (Signed)
Has seen Dr Maryruth Bun.  On no medication now.  Overall she feels anxiety is better.  Follow.

## 2020-06-29 NOTE — Telephone Encounter (Signed)
Pt is scheduled on 08/30 at 9:45 am. Pt is aware

## 2020-06-29 NOTE — Telephone Encounter (Signed)
Pt is scheduled and has been notified.

## 2020-07-05 ENCOUNTER — Ambulatory Visit
Admission: RE | Admit: 2020-07-05 | Discharge: 2020-07-05 | Disposition: A | Discharge status: Home / Self Care | Payer: Self-pay | Source: Ambulatory Visit | Attending: Internal Medicine | Admitting: Internal Medicine

## 2020-07-05 ENCOUNTER — Other Ambulatory Visit: Payer: Self-pay | Admitting: Internal Medicine

## 2020-07-05 ENCOUNTER — Other Ambulatory Visit: Payer: Self-pay

## 2020-07-05 DIAGNOSIS — R1310 Dysphagia, unspecified: Secondary | ICD-10-CM

## 2020-07-06 ENCOUNTER — Encounter: Payer: Self-pay | Admitting: Primary Care

## 2020-07-06 ENCOUNTER — Encounter: Payer: Self-pay | Admitting: Internal Medicine

## 2020-07-06 ENCOUNTER — Ambulatory Visit (INDEPENDENT_AMBULATORY_CARE_PROVIDER_SITE_OTHER): Payer: Self-pay | Admitting: Primary Care

## 2020-07-06 DIAGNOSIS — R0789 Other chest pain: Secondary | ICD-10-CM | POA: Insufficient documentation

## 2020-07-06 DIAGNOSIS — J4530 Mild persistent asthma, uncomplicated: Secondary | ICD-10-CM

## 2020-07-06 MED ORDER — BREO ELLIPTA 200-25 MCG/INH IN AEPB: 1.0000 | INHALATION_SPRAY | Freq: Every day | RESPIRATORY_TRACT | 1 refills | Status: DC

## 2020-07-06 NOTE — Assessment & Plan Note (Addendum)
-   PFT showed no obvious evidence of obstructive or restrictive lung disease. She did have +BD response of 16% in FEV1 suggestive of reactive airway disease. Patient continues to have chest tightness/discomfort. No improvement on Trelegy, stopped d.t side effects. Trial increase Breo Ellipta to 200 one puff daily; Xopenex hfa 2 puffs every 6 hours as needed for chest tightness/wheezing

## 2020-07-06 NOTE — Assessment & Plan Note (Addendum)
-   Chest discomfort not consistent with underlying asthma. She did have mild reflux on recent barium swallow. ECG and Troponin in December 2020 were normal. Recommend continuing omeprazole 40mg  daily and OTC Maalox or TUMS as needed for chest discomfort, if improved likely related to GERD. If no improvement would recommend further evaluation by cardiology.

## 2020-07-06 NOTE — Patient Instructions (Addendum)
Asthma: - Stop Trelegy - Increase Breo Ellipta to 200, take one puff daily (rinse mouth after use) - Use Xopenex 2 puffs every 6 hours as needed for chest tightness/wheezing  GERD: - Continue Prilosec 40mg  once daily- take 30 min before breakfast on empty stomach - If experiencing chest discomfort try taking Maalox or TUMS to see if this helps (if no improvement follow-up with Cardiology)  Follow-up: - With PCP and specialist as directed  - 3 months with Dr. 

## 2020-07-06 NOTE — Progress Notes (Signed)
@Patient  ID: , female    DOB: 1981/10/05, 39 y.o.   MRN: [id number]  Chief Complaint  Patient presents with   Follow-up    increased chest tightness "feels like my heart is being squeezed"- she states "feels funny".    Referring provider: [id number], MD  HPI: 39 year old female, never smoked.  Past medical history significant for mild persistent asthma, raynauds phenomenon, GERD.  Patient of Dr. 30, last seen 05/24/2020.  Previous [facility] pulmonary encounter: 05/24/20 HPI- Dr. Titiana Severa is a 39 year old lifelong never smoker, who presents for follow-up on the issue of dyspnea, chest discomfort and fatigue. He was initially evaluated on 11 February 2020.  Most recent visit was on 22 Mar 2020.At that time she was given a trial of Breo Ellipta 100/25 1 inhalation daily.She does have mild obstructive lung defect with airways reversibility with bronchodilator on PFTs. She does not endorse any new symptoms from prior. She continues to have vague symptoms of generalized muscle weakness, fatigue, anxiety, palpitations and random muscle fasciculations. She also exhibits Raynaud's phenomena. She is being evaluated by various specialists to include neurology, psychiatry and rheumatology.  She has also intermittent atypical chest pain.  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 She did exhibit issues with gastroesophageal reflux previously however these are now controlled with omeprazole.  He has not had any fevers, chills or sweats.  No other new symptomatology.  07/06/2020 Patient presents today for 1 month follow-up. She was given short trial of Trelegy Ellipta for dyspnea/persistent asthma symptoms.  Pulmonary function testing showed mild obstructive lung defect with reversibility.  She used Trelegy sporadically with no significant improvement, she stopped d/t side effects of somewhat vague symptoms including "not feeling well", nausea and  shakiness. She is currently on Breo 100. Continues to report intermittent mid-sternal chest tightness/discomfort. She feels it is more cardiac in nature than related to her asthma. She rarely requires xopenex and does not notice improvement in chest discomfort when she uses this. She had barium swallow yesterday that was normal except for mild reflux. She is on omeprazole 40mg  daily for GERD symptoms. She has not had covid virus. Never smoked. No known cardiac hx. She has had negative rheumatologic work up. She has been evaluated by various specialists.   Data: 02/17/2020: 2D echocardiogram>>EF 65 to 70% normal left ventricular wall motion. Somewhat hyperdynamic. Otherwise no valvular disease or abnormalities. 03/01/2020: PFTs>>FEV1 3.12 L or 98% predicted with significant bronchodilator response with an net change of 16%. FVC 4.19 L or 108% predicted. FEV1/FVC 75%. There is mild hyperinflation and moderate air trapping consistent with obstructive airways disease of mild nature. Diffusion capacity normal. 07/05/20 Barium swallow- Mild gastroesophageal reflux.  Otherwise normal barium swallow.   No Known Allergies  Immunization History  Administered Date(s) Administered   Influenza Split 08/07/2015   Influenza,inj,Quad PF,6+ Mos 08/13/2019   PFIZER SARS-COV-2 Vaccination 01/23/2020, 02/23/2020   Tdap 04/06/2017    Past Medical History:  Diagnosis Date   Gestational diabetes     Tobacco History: Social History   Tobacco Use  Smoking Status Never Smoker  Smokeless Tobacco Never Used   Counseling given: Not Answered   Outpatient Medications Prior to Visit  Medication Sig Dispense Refill   levalbuterol (XOPENEX HFA) 45 MCG/ACT inhaler Inhale 2 puffs into the lungs every 8 (eight) hours as needed for wheezing or shortness of breath. 1 Inhaler 12   Multiple Vitamin (MULTIVITAMIN) tablet Take 1 tablet by mouth daily.  omeprazole (PRILOSEC) 40 MG capsule TAKE 1  CAPSULE (40 MG TOTAL) BY MOUTH EVERY EVENING. 90 capsule 1   fluticasone furoate-vilanterol (BREO ELLIPTA) 100-25 MCG/INH AEPB Inhale 1 puff into the lungs daily. 1 each 6   No facility-administered medications prior to visit.   Review of Systems  Review of Systems  Constitutional: Positive for fatigue.  Respiratory: Positive for chest tightness and shortness of breath. Negative for apnea, cough, choking, wheezing and stridor.   Cardiovascular: Positive for palpitations. Negative for leg swelling.  Neurological: Positive for weakness.   Physical Exam  BP 114/74 (BP Location: Left Arm, Cuff Size: Normal)    Pulse 78    Temp 98.4 F (36.9 C) (Temporal)    Ht 5\' 5"  (1.651 m)    Wt 159 lb 9.6 oz (72.4 kg)    SpO2 98% Comment: on RA   BMI 26.56 kg/m  Physical Exam Constitutional:      Appearance: Normal appearance.  HENT:     Head: Normocephalic and atraumatic.  Cardiovascular:     Rate and Rhythm: Normal rate and regular rhythm.     Heart sounds: No murmur heard.  No friction rub.  Pulmonary:     Effort: Pulmonary effort is normal. No respiratory distress.     Breath sounds: Normal breath sounds. No stridor. No wheezing or rhonchi.  Skin:    General: Skin is warm and dry.  Neurological:     General: No focal deficit present.     Mental Status: She is alert and oriented to person, place, and time. Mental status is at baseline.  Psychiatric:        Mood and Affect: Mood normal.        Behavior: Behavior normal.        Thought Content: Thought content normal.        Judgment: Judgment normal.      Lab Results:  CBC    Component Value Date/Time   WBC 6.6 01/29/2020 0938   RBC 4.48 01/29/2020 0938   HGB 13.2 01/29/2020 0938   HCT 39.5 01/29/2020 0938   PLT 291.0 01/29/2020 0938   MCV 88.0 01/29/2020 0938   MCH 28.3 10/20/2019 1817   MCHC 33.4 01/29/2020 0938   RDW 13.5 01/29/2020 0938   LYMPHSABS 1.7 01/29/2020 0938   MONOABS 0.3 01/29/2020 0938   EOSABS 0.0  01/29/2020 0938   BASOSABS 0.1 01/29/2020 0938    BMET    Component Value Date/Time   NA 136 01/29/2020 0938   K 4.3 01/29/2020 0938   CL 104 01/29/2020 0938   CO2 28 01/29/2020 0938   GLUCOSE 88 01/29/2020 0938   BUN 11 01/29/2020 0938   CREATININE 0.69 01/29/2020 0938   CALCIUM 9.6 01/29/2020 0938   GFRNONAA >60 10/20/2019 1817   GFRAA >60 10/20/2019 1817    BNP No results found for: BNP  ProBNP No results found for: PROBNP  Imaging: DG ESOPHAGUS W DOUBLE CM (HD)  Result Date: 07/05/2020 CLINICAL DATA:  Trouble swallowing. EXAM: ESOPHOGRAM / BARIUM SWALLOW / BARIUM TABLET STUDY TECHNIQUE: Combined double contrast and single contrast examination performed using effervescent crystals, thick barium liquid, and thin barium liquid. The patient was observed with fluoroscopy swallowing a 13 mm barium sulphate tablet. FLUOROSCOPY TIME:  Fluoroscopy Time:  30 seconds Radiation Exposure Index (if provided by the fluoroscopic device): 1.5 mGy Number of Acquired Spot Images: 0 COMPARISON:  None. FINDINGS: Normal pharyngeal anatomy and motility. Contrast flowed freely through the esophagus without evidence of  a stricture or mass. Normal esophageal mucosa without evidence of irregularity or ulceration. Esophageal motility was normal. Mild gastroesophageal reflux. No definite hiatal hernia was demonstrated. At the end of the examination a 13 mm barium tablet was administered which transited through the esophagus and esophagogastric junction without delay. IMPRESSION: Mild gastroesophageal reflux.  Otherwise normal barium swallow. Electronically Signed   By: Elige Ko   On: 07/05/2020 12:32     Assessment & Plan:   Asthma, mild persistent - PFT showed no obvious evidence of obstructive or restrictive lung disease. She did have +BD response of 16% in FEV1 suggestive of reactive airway disease. Patient continues to have chest tightness/discomfort. No improvement on Trelegy, stopped d.t side  effects. Trial increase Breo Ellipta to 200 one puff daily; Xopenex hfa 2 puffs every 6 hours as needed for chest tightness/wheezing  Chest discomfort - Chest discomfort not consistent with underlying asthma. She did have mild reflux on recent barium swallow. ECG and Troponin in December 2020 were normal. Recommend continuing omeprazole 40mg  daily and OTC Maalox or TUMS as needed for chest discomfort, if improved likely related to GERD. If no improvement would recommend further evaluation by cardiology.    , NP 07/06/2020

## 2020-07-06 NOTE — Telephone Encounter (Signed)
Pt is coming into office 

## 2020-07-09 ENCOUNTER — Other Ambulatory Visit: Payer: Self-pay

## 2020-07-09 ENCOUNTER — Ambulatory Visit (INDEPENDENT_AMBULATORY_CARE_PROVIDER_SITE_OTHER): Payer: Self-pay | Admitting: Internal Medicine

## 2020-07-09 VITALS — BP 120/70 | HR 70 | Temp 98.1°F | Resp 16 | Ht 65.0 in | Wt 158.0 lb

## 2020-07-09 DIAGNOSIS — R0602 Shortness of breath: Secondary | ICD-10-CM

## 2020-07-09 DIAGNOSIS — R253 Fasciculation: Secondary | ICD-10-CM

## 2020-07-09 DIAGNOSIS — R0789 Other chest pain: Secondary | ICD-10-CM

## 2020-07-09 DIAGNOSIS — J4530 Mild persistent asthma, uncomplicated: Secondary | ICD-10-CM

## 2020-07-09 DIAGNOSIS — R1310 Dysphagia, unspecified: Secondary | ICD-10-CM

## 2020-07-09 DIAGNOSIS — R531 Weakness: Secondary | ICD-10-CM

## 2020-07-09 DIAGNOSIS — F419 Anxiety disorder, unspecified: Secondary | ICD-10-CM

## 2020-07-09 LAB — BASIC METABOLIC PANEL
BUN: 8 mg/dL (ref 6–23)
CO2: 28 mEq/L (ref 19–32)
Calcium: 9.6 mg/dL (ref 8.4–10.5)
Chloride: 103 mEq/L (ref 96–112)
Creatinine, Ser: 0.76 mg/dL (ref 0.40–1.20)
GFR: 85.54 mL/min (ref >60.00)
Glucose, Bld: 91 mg/dL (ref 70–99)
Potassium: 4.2 mEq/L (ref 3.5–5.1)
Sodium: 138 mEq/L (ref 135–145)

## 2020-07-09 LAB — CBC WITH DIFFERENTIAL/PLATELET
Basophils Absolute: 0 10*3/uL (ref 0.0–0.1)
Basophils Relative: 0.4 % (ref 0.0–3.0)
Eosinophils Absolute: 0 10*3/uL (ref 0.0–0.7)
Eosinophils Relative: 0.3 % (ref 0.0–5.0)
HCT: 40.7 % (ref 36.0–46.0)
Hemoglobin: 13.3 g/dL (ref 12.0–15.0)
Lymphocytes Relative: 27.7 % (ref 12.0–46.0)
Lymphs Abs: 1.9 10*3/uL (ref 0.7–4.0)
MCHC: 32.7 g/dL (ref 30.0–36.0)
MCV: 87.4 fl (ref 78.0–100.0)
Monocytes Absolute: 0.4 10*3/uL (ref 0.1–1.0)
Monocytes Relative: 5.9 % (ref 3.0–12.0)
Neutro Abs: 4.6 10*3/uL (ref 1.4–7.7)
Neutrophils Relative %: 65.7 % (ref 43.0–77.0)
Platelets: 314 10*3/uL (ref 150.0–400.0)
RBC: 4.66 Mil/uL (ref 3.87–5.11)
RDW: 13.7 % (ref 11.5–15.5)
WBC: 7 10*3/uL (ref 4.0–10.5)

## 2020-07-09 NOTE — Progress Notes (Signed)
Patient ID: Tonya Harris, female   DOB: Oct 24, 1981, 39 y.o.   MRN: [id number]   Subjective:    Patient ID: Tonya Harris, female    DOB: Jan 02, 1981, 39 y.o.   MRN: [id number]  HPI This visit occurred during the SARS-CoV-2 public health emergency.  Safety protocols were in place, including screening questions prior to the visit, additional usage of staff PPE, and extensive cleaning of exam room while observing appropriate contact time as indicated for disinfecting solutions.  Patient here for a scheduled follow up.  Here to f/u regarding weakness, chest discomfort, etc.  Has seen neurology.  Was diagnosed with benign fasciculations syndrome.  Still with noticed "twitching" and raynaud's changes (in her fingers).  She is not exercising as much as she used to.  Through her life, has always been very active.  She reports noticing a sensation in her chest - feels like a "squeezing" sensation in her heart.  At time swill feels as if she is going to pass out.  Has seen cardiology previously.  ECHO 02/17/20 - EF 65-70%.  No valvular disease.  Pulmonary diagnosed with mild asthma.  Trial initially with Breo and then trelegy.  Uses inhalers.  Felt worse in general.  Also reports some sob with activity.  Describes sensations of feeling like her stomach is "churning".  Some nausea.  Recently had swallowing evaluation.  Some mild acid reflux.  On prilosec.  She is eating.  Does not feel as anxious.  Kids in school.  No increased abdominal pain.  Bowels moving.     Past Medical History:  Diagnosis Date  . Gestational diabetes    Past Surgical History:  Procedure Laterality Date  . APPENDECTOMY     Family History  Problem Relation Age of Onset  . Hypertension Mother   . Hypertension Father    Social History   Socioeconomic History  . Marital status: Married    Spouse name: Not on file  . Number of children: 4  . Years of education: Not on file  . Highest education level: Master's degree (e.g.,  MA, MS, MEng, MEd, MSW, MBA)  Occupational History  . Occupation: Pharmacist, hospital  . Occupation: Stay at home mom  Tobacco Use  . Smoking status: Never Smoker  . Smokeless tobacco: Never Used  Vaping Use  . Vaping Use: Never used  Substance and Sexual Activity  . Alcohol use: Not Currently  . Drug use: Not Currently  . Sexual activity: Yes    Partners: Male  Other Topics Concern  . 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 Difficulty of Paying Living Expenses: Not on file  Food Insecurity:   . Worried About Charity fundraiser in the Last Year: Not on file  . Ran Out of Food in the Last Year: Not on file  Transportation Needs:   . Lack of Transportation (Medical): Not on file  . Lack of Transportation (Non-Medical): Not on file  Physical Activity:   . Days of Exercise per Week: Not on file  . Minutes of Exercise per Session: Not on file  Stress:   . Feeling of Stress : Not on file  Social Connections:   . Frequency of Communication with Friends and Family: Not on file  .  Frequency of Social Gatherings with Friends and Family: Not on file  . Attends Religious Services: Not on file  . Active Member of Clubs or Organizations: Not on file  . Attends Archivist Meetings: Not on file  . Marital Status: Not on file    Outpatient Encounter Medications as of 07/09/2020  Medication Sig  . fluticasone furoate-vilanterol (BREO ELLIPTA) 200-25 MCG/INH AEPB Inhale 1 puff into the lungs daily.  Marland Kitchen levalbuterol (XOPENEX HFA) 45 MCG/ACT inhaler Inhale 2 puffs into the lungs every 8 (eight) hours as needed for wheezing or shortness of breath.  . Multiple Vitamin (MULTIVITAMIN) tablet Take 1 tablet by mouth daily.  Marland Kitchen omeprazole (PRILOSEC) 40 MG capsule TAKE 1 CAPSULE (40 MG TOTAL) BY MOUTH EVERY EVENING.   No  facility-administered encounter medications on file as of 07/09/2020.    Review of Systems  Constitutional: Positive for fatigue. Negative for appetite change.  HENT: Negative for congestion and sinus pressure.   Respiratory: Negative for cough.        Some sob noted with increased activity.   Cardiovascular: Positive for chest pain. Negative for palpitations and leg swelling.  Gastrointestinal: Negative for abdominal pain and diarrhea.       Occasional nausea.  Did notice vomiting episode recently.  Not a persistent problem.    Genitourinary: Negative for difficulty urinating and dysuria.  Musculoskeletal:       Describes hand swelling.  Color changes in fingers - raynaud's.    Skin: Negative for rash and wound.  Neurological: Negative for dizziness, light-headedness and headaches.  Psychiatric/Behavioral: Negative for agitation and dysphoric mood.       Objective:    Physical Exam Vitals reviewed.  Constitutional:      General: She is not in acute distress.    Appearance: Normal appearance.  HENT:     Head: Normocephalic and atraumatic.     Right Ear: External ear normal.     Left Ear: External ear normal.  Eyes:     General: No scleral icterus.       Right eye: No discharge.        Left eye: No discharge.     Conjunctiva/sclera: Conjunctivae normal.  Neck:     Thyroid: No thyromegaly.  Cardiovascular:     Rate and Rhythm: Normal rate and regular rhythm.  Pulmonary:     Effort: No respiratory distress.     Breath sounds: Normal breath sounds. No wheezing.  Abdominal:     General: Bowel sounds are normal.     Palpations: Abdomen is soft.     Tenderness: There is no abdominal tenderness.  Musculoskeletal:        General: No swelling or tenderness.     Cervical back: Neck supple. No tenderness.  Lymphadenopathy:     Cervical: No cervical adenopathy.  Skin:    Findings: No erythema or rash.  Neurological:     Mental Status: She is alert.  Psychiatric:        Mood  and Affect: Mood normal.        Behavior: Behavior normal.     BP 120/70   Pulse 70   Temp 98.1 F (36.7 C) (Oral)   Resp 16   Ht $R'5\' 5"'Ix$  (1.651 m)   Wt 158 lb (71.7 kg)   SpO2 99%   BMI 26.29 kg/m  Wt Readings from Last 3 Encounters:  07/09/20 158 lb (71.7 kg)  07/06/20 159 lb 9.6 oz (72.4 kg)  06/21/20 160 lb (  72.6 kg)     Lab Results  Component Value Date   WBC 7.0 07/09/2020   HGB 13.3 07/09/2020   HCT 40.7 07/09/2020   PLT 314.0 07/09/2020   GLUCOSE 91 07/09/2020   ALT 16 01/29/2020   AST 18 01/29/2020   NA 138 07/09/2020   K 4.2 07/09/2020   CL 103 07/09/2020   CREATININE 0.76 07/09/2020   BUN 8 07/09/2020   CO2 28 07/09/2020   TSH 1.51 01/29/2020    DG ESOPHAGUS W DOUBLE CM (HD)  Result Date: 07/05/2020 CLINICAL DATA:  Trouble swallowing. EXAM: ESOPHOGRAM / BARIUM SWALLOW / BARIUM TABLET STUDY TECHNIQUE: Combined double contrast and single contrast examination performed using effervescent crystals, thick barium liquid, and thin barium liquid. The patient was observed with fluoroscopy swallowing a 13 mm barium sulphate tablet. FLUOROSCOPY TIME:  Fluoroscopy Time:  30 seconds Radiation Exposure Index (if provided by the fluoroscopic device): 1.5 mGy Number of Acquired Spot Images: 0 COMPARISON:  None. FINDINGS: Normal pharyngeal anatomy and motility. Contrast flowed freely through the esophagus without evidence of a stricture or mass. Normal esophageal mucosa without evidence of irregularity or ulceration. Esophageal motility was normal. Mild gastroesophageal reflux. No definite hiatal hernia was demonstrated. At the end of the examination a 13 mm barium tablet was administered which transited through the esophagus and esophagogastric junction without delay. IMPRESSION: Mild gastroesophageal reflux.  Otherwise normal barium swallow. Electronically Signed   By: Elige Ko   On: 07/05/2020 12:32       Assessment & Plan:   Problem List Items Addressed This Visit     Weakness - Primary    Persistent issue for her.  Not exercising as much now.  Some fatigue.  Will check cbc and met b to confirm still wnl.  Describes the squeezing sensation - heart. Also will intermittently feel weak - like she is going to pass out.  She has seen cardiology previously.  ECHO as outlined. Wore holter monitor for short period.   Given persistent symptoms limiting activity, will refer back to cardiology for further evaluation.  Question of need for zio, further cardiac testing, etc.        Relevant Orders   CBC with Differential/Platelet (Completed)   Basic metabolic panel (Completed)   SOB (shortness of breath)    Describes sob with exertion.  Has seen pulmonary.  Diagnosed with mild asthma.  Intolerant to inhalers as outlined.  Treating acid reflux.  ECHO as outlined. Refer back to cardiology for further evaluation.        Muscle twitching    Evaluated by neurology.  Diagnosed with benign fasciculations syndrome.  Stable.       Dysphagia    Recent swallowing evaluation revealed mild acid reflux.  Continue prilosec.        Chest discomfort    Describes the squeezing sensation in her chest as outlined.  Has seen pulmonary.  Diagnosed with mild asthma.  Intolerant to [employer]. Treating acid reflux.  Has seen cardiology previously.  W/up as outlined.  Given persistent symptoms and the intermittent near syncopal feeling with activity, will refer back to cardiology for further evaluation and w/up.       Asthma, mild persistent    Diagnosed with mild asthma.  Intolerant to trelegy and Breo.  No increased cough or congestion.  Follow.       Anxiety    Has been seeing Dr Maryruth Bun.  This has improved.  On no medication now.  Follow.  Keller Bounds, MD 

## 2020-07-10 ENCOUNTER — Encounter: Payer: Self-pay | Admitting: Internal Medicine

## 2020-07-12 ENCOUNTER — Encounter: Payer: Self-pay | Admitting: Internal Medicine

## 2020-07-13 NOTE — Telephone Encounter (Signed)
Patient was returning your call about message she left on Mychart

## 2020-07-13 NOTE — Telephone Encounter (Signed)
Pt called returning your call 

## 2020-07-13 NOTE — Telephone Encounter (Signed)
Pt returned your call.  

## 2020-07-15 ENCOUNTER — Encounter: Payer: Self-pay | Admitting: Internal Medicine

## 2020-07-15 NOTE — Assessment & Plan Note (Signed)
Has been seeing Dr Maryruth Bun.  This has improved.  On no medication now.  Follow.

## 2020-07-15 NOTE — Assessment & Plan Note (Signed)
Diagnosed with mild asthma.  Intolerant to trelegy and Breo.  No increased cough or congestion.  Follow.

## 2020-07-15 NOTE — Assessment & Plan Note (Signed)
Evaluated by neurology.  Diagnosed with benign fasciculations syndrome.  Stable.

## 2020-07-15 NOTE — Assessment & Plan Note (Signed)
Describes the squeezing sensation in her chest as outlined.  Has seen pulmonary.  Diagnosed with mild asthma.  Intolerant to [employer]. Treating acid reflux.  Has seen cardiology previously.  W/up as outlined.  Given persistent symptoms and the intermittent near syncopal feeling with activity, will refer back to cardiology for further evaluation and w/up.

## 2020-07-15 NOTE — Assessment & Plan Note (Signed)
Recent swallowing evaluation revealed mild acid reflux.  Continue prilosec.

## 2020-07-15 NOTE — Assessment & Plan Note (Signed)
Persistent issue for her.  Not exercising as much now.  Some fatigue.  Will check cbc and met b to confirm still wnl.  Describes the squeezing sensation - heart. Also will intermittently feel weak - like she is going to pass out.  She has seen cardiology previously.  ECHO as outlined. Wore holter monitor for short period.   Given persistent symptoms limiting activity, will refer back to cardiology for further evaluation.  Question of need for zio, further cardiac testing, etc.

## 2020-07-15 NOTE — Assessment & Plan Note (Signed)
Describes sob with exertion.  Has seen pulmonary.  Diagnosed with mild asthma.  Intolerant to inhalers as outlined.  Treating acid reflux.  ECHO as outlined. Refer back to cardiology for further evaluation.

## 2020-07-16 ENCOUNTER — Ambulatory Visit: Payer: Self-pay | Admitting: Internal Medicine

## 2020-07-22 ENCOUNTER — Ambulatory Visit: Payer: Self-pay | Admitting: Internal Medicine

## 2020-07-23 NOTE — Progress Notes (Signed)
Agree with the details of the visit as noted by Polette Walsh, NP.  C. Laura Anabella Capshaw, MD [facility] 

## 2020-08-03 NOTE — Progress Notes (Signed)
Electrophysiology Office Note:    Date:  08/04/2020   ID:  Tonya Harris, DOB Mar 13, 1981, MRN [id number]  PCP:  Dale Appleton, MD  [facility] Cardiologist:  No primary care provider on file.  [facility] Electrophysiologist:  None   Referring MD: Dale [place], MD   Chief Complaint: tachycardia  History of Present Illness:    Tonya Harris is a 39 y.o. female who presents for an evaluation of tachycardia at the request of Dr Lady Gary. Their medical history includes gestational diabetes, GERD, Raynaud's, anxiety. She has worn a 21 day holter which showed sinus rhythm, sinus tachycardia and rare PACs and PVCs. No SVT on the holter. At her last appointment with Dr Lady Gary, she reported sporadic exertional SOB with weakness and chest discomfort. Stress echo was performed which was normal. She has previously been seen by neurology, rheumatology, endocrinology for episodes of lightheadedness, weakness, cramps, night sweats which has not identified a cause for her symptoms.  Today, we spent a significant amount of time discussing her symptoms. She tells me that she has previously received care with a psychiatrist for severe anxiety. She has worked hard on that issue, has taken multiple medications. She recently had stopped all of her medications but then restarted her Sertraline. She tells me that she believes her anxiety is better.   Past Medical History:  Diagnosis Date  . Gestational diabetes     Past Surgical History:  Procedure Laterality Date  . APPENDECTOMY      Current Medications: Current Meds  Medication Sig  . fluticasone furoate-vilanterol (BREO ELLIPTA) 200-25 MCG/INH AEPB Inhale 1 puff into the lungs daily.  Marland Kitchen levalbuterol (XOPENEX HFA) 45 MCG/ACT inhaler Inhale 2 puffs into the lungs every 8 (eight) hours as needed for wheezing or shortness of breath.  . Multiple Vitamin (MULTIVITAMIN) tablet Take 1 tablet by mouth daily.  Marland Kitchen omeprazole (PRILOSEC) 40 MG capsule  TAKE 1 CAPSULE (40 MG TOTAL) BY MOUTH EVERY EVENING.     Allergies:   Patient has no known allergies.   Social History   Socioeconomic History  . Marital status: Married    Spouse name: Not on file  . Number of children: 4  . Years of education: Not on file  . Highest education level: Master's degree (e.g., MA, MS, MEng, MEd, MSW, MBA)  Occupational History  . 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 Smoking status: Never Smoker  . Smokeless tobacco: Never Used  Vaping Use  . Vaping Use: Never used  Substance and Sexual Activity  . Alcohol use: Not Currently  . Drug use: Not Currently  . Sexual activity: Yes    Partners: Male  Other Topics Concern  . 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 Difficulty of Paying Living Expenses: Not on file  Food Insecurity:   . Worried About Programme researcher, broadcasting/film/video in the Last Year: Not on file  . Ran Out of Food in the Last Year: Not on file  Transportation Needs:   . Lack of Transportation (Medical): Not on file  . Lack of Transportation (Non-Medical): Not on file  Physical Activity:   . Days of Exercise per Week: Not on file  . Minutes of Exercise per Session:  Not on file  Stress:   . Feeling of Stress : Not on file  Social Connections:   . Frequency of Communication with Friends and Family: Not on file  . Frequency of Social Gatherings with Friends and Family: Not on file  . Attends Religious Services: Not on file  . Active Member of Clubs or Organizations: Not on file  . Attends Banker Meetings: Not on file  . Marital Status: Not on file     Family History: The patient's family history includes Hypertension in her father and mother.  ROS:   Please see the history of present illness.    All other systems reviewed and are  negative.  EKGs/Labs/Other Studies Reviewed:    The following studies were reviewed today: Outside records, Echo  02/17/2020 Echo personally reviewed LV normal, RV normal, no significant valvular abnormalities  EKG:  The ekg ordered today demonstrates normal rhythm.  Recent Labs: 01/29/2020: ALT 16; TSH 1.51 07/09/2020: BUN 8; Creatinine, Ser 0.76; Hemoglobin 13.3; Platelets 314.0; Potassium 4.2; Sodium 138  Recent Lipid Panel No results found for: CHOL, TRIG, HDL, CHOLHDL, VLDL, LDLCALC, LDLDIRECT  Physical Exam:    VS:  BP 100/80   Pulse 77   Ht 5\' 5"  (1.651 m)   Wt 158 lb (71.7 kg)   BMI 26.29 kg/m     Wt Readings from Last 3 Encounters:  08/04/20 158 lb (71.7 kg)  07/09/20 158 lb (71.7 kg)  07/06/20 159 lb 9.6 oz (72.4 kg)     GEN:  Well nourished, well developed in no acute distress HEENT: Normal NECK: No JVD; No carotid bruits LYMPHATICS: No lymphadenopathy CARDIAC: RRR, no murmurs, rubs, gallops RESPIRATORY:  Clear to auscultation without rales, wheezing or rhonchi  ABDOMEN: Soft, non-tender, non-distended MUSCULOSKELETAL:  No edema; No deformity  SKIN: Warm and dry NEUROLOGIC:  Alert and oriented x 3 PSYCHIATRIC:  Normal affect   ASSESSMENT:    1. Tachycardia   2. Chest pressure    PLAN:    In order of problems listed above:  1. Palpitations, Chest pressure, SOB She has a variety of complaints today including dyspnea, a wrenching chest pain, palpitations, muscle tremor, lightheadedness without passing out. Her symptoms have been present for a long time but are worsening. For these symptoms she has seen several specialists and had many tests including a stress echocardiogram and holter from her primary cardiologist. These have not shown any abnormality.  After much discussion she also tells me that she has suffered from significant anxiety in the past. She had weaned off her antidepressants but then recently restarted her Sertraline.   I am concerned that  her symptoms represent significant under treated anxiety. I discussed the limited utility of additional testing but did offer to perform another treadmill ECG to assess for exercise induced arrhythmias and, if negative, help reassure her.  Medication Adjustments/Labs and Tests Ordered: Current medicines are reviewed at length with the patient today.  Concerns regarding medicines are outlined above.  Orders Placed This Encounter  Procedures  . EKG 12-Lead   No orders of the defined types were placed in this encounter.    Signed, 07/08/20, MD, [facility]  08/04/2020 5:11 PM    Electrophysiology [facility]

## 2020-08-04 ENCOUNTER — Other Ambulatory Visit: Payer: Self-pay

## 2020-08-04 ENCOUNTER — Ambulatory Visit (INDEPENDENT_AMBULATORY_CARE_PROVIDER_SITE_OTHER): Payer: Self-pay | Admitting: Cardiology

## 2020-08-04 ENCOUNTER — Encounter: Payer: Self-pay | Admitting: Cardiology

## 2020-08-04 VITALS — BP 100/80 | HR 77 | Ht 65.0 in | Wt 158.0 lb

## 2020-08-04 DIAGNOSIS — R000 Tachycardia, unspecified: Secondary | ICD-10-CM

## 2020-08-04 DIAGNOSIS — R0789 Other chest pain: Secondary | ICD-10-CM

## 2020-08-04 NOTE — Patient Instructions (Addendum)

## 2020-08-06 NOTE — Telephone Encounter (Signed)
Please call and confirm with her these are the same symptoms she has been reporting.  Confirm no change or acute issue.  We can see about trying to move her appt up with me.  Will need to check schedule.  Any acute change will need to be seen.

## 2020-08-11 ENCOUNTER — Other Ambulatory Visit: Payer: Self-pay

## 2020-08-11 ENCOUNTER — Ambulatory Visit (INDEPENDENT_AMBULATORY_CARE_PROVIDER_SITE_OTHER): Payer: Self-pay | Admitting: Internal Medicine

## 2020-08-11 DIAGNOSIS — R5383 Other fatigue: Secondary | ICD-10-CM

## 2020-08-11 DIAGNOSIS — R0602 Shortness of breath: Secondary | ICD-10-CM

## 2020-08-11 DIAGNOSIS — R253 Fasciculation: Secondary | ICD-10-CM

## 2020-08-11 DIAGNOSIS — F419 Anxiety disorder, unspecified: Secondary | ICD-10-CM

## 2020-08-11 DIAGNOSIS — R531 Weakness: Secondary | ICD-10-CM

## 2020-08-11 NOTE — Progress Notes (Signed)
Patient ID: Tonya Harris, female   DOB: [birth year], 39 y.o.   MRN: [id number]   Subjective:    Patient ID: Tonya Harris, female    DOB: 1981/09/18, 39 y.o.   MRN: [id number]  HPI This visit occurred during the SARS-CoV-2 public health emergency.  Safety protocols were in place, including screening questions prior to the visit, additional usage of staff PPE, and extensive cleaning of exam room while observing appropriate contact time as indicated for disinfecting solutions.  Patient here for scheduled follow up.  Here to follow up regarding weakness, sob, increased heart rate and muscle twitches.  Has had extensive w/up previously.  Describes a squeezing sensation in her chest with increased heart rate and sob.  Has seen cardiology previously.  Had f/u 07/29/20.  Reviewed w/up.  Stress echo and 21 days holter monitor - negative for any abnormalities.  Recently referred to EP.  No further w/up felt warranted.  Has had extensive w/up by neurology, rheumatology and pulmonary.  Neurology diagnosed with benign fasciculations syndrome.  Has raynaud's.  Pulmonary diagnosed with mild asthma.  Symptoms worsened with inhalers.  Still having issues with catching her breath.  Still feels weak and still having the twitching.  Denies increased anxiety.  Frustrated.    Past Medical History:  Diagnosis Date  . Gestational diabetes    Past Surgical History:  Procedure Laterality Date  . APPENDECTOMY     Family History  Problem Relation Age of Onset  . Hypertension Mother   . Hypertension Father    Social History   Socioeconomic History  . Marital status: Married    Spouse name: Not on file  . Number of children: 4  . Years of education: Not on file  . Highest education level: Master's degree (e.g., MA, MS, MEng, MEd, MSW, MBA)  Occupational History  . 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 Smoking status: Never Smoker  . Smokeless tobacco: Never Used  Vaping Use   . Vaping Use: Never used  Substance and Sexual Activity  . Alcohol use: Not Currently  . Drug use: Not Currently  . Sexual activity: Yes    Partners: Male  Other Topics Concern  . 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 Difficulty of Paying Living Expenses: Not on file  Food Insecurity:   . Worried About Programme researcher, broadcasting/film/video in the Last Year: Not on file  . Ran Out of Food in the Last Year: Not on file  Transportation Needs:   . Lack of Transportation (Medical): Not on file  . Lack of Transportation (Non-Medical): Not on file  Physical Activity:   . Days of Exercise per Week: Not on file  . Minutes of Exercise per Session: Not on file  Stress:   . Feeling of Stress : Not on file  Social Connections:   . Frequency of Communication with Friends and Family: Not on file  . Frequency of Social Gatherings with Friends and Family: Not on file  . Attends Religious Services: Not on file  . Active Member of Clubs or Organizations: Not on file  . Attends Banker Meetings: Not on file  . Marital Status: Not on file    Outpatient Encounter  Medications as of 08/11/2020  Medication Sig  . fluticasone furoate-vilanterol (BREO ELLIPTA) 200-25 MCG/INH AEPB Inhale 1 puff into the lungs daily.  Marland Kitchen levalbuterol (XOPENEX HFA) 45 MCG/ACT inhaler Inhale 2 puffs into the lungs every 8 (eight) hours as needed for wheezing or shortness of breath.  . Multiple Vitamin (MULTIVITAMIN) tablet Take 1 tablet by mouth daily.  Marland Kitchen omeprazole (PRILOSEC) 40 MG capsule TAKE 1 CAPSULE (40 MG TOTAL) BY MOUTH EVERY EVENING.   No facility-administered encounter medications on file as of 08/11/2020.    Review of Systems  Constitutional: Positive for fatigue. Negative for appetite change and unexpected weight change.  HENT: Negative for  congestion and sinus pressure.   Respiratory: Positive for shortness of breath. Negative for cough.        Intermittent chest tightness  Cardiovascular: Negative for leg swelling.       Increased heart rate as outlined.   Gastrointestinal: Negative for abdominal pain, diarrhea, nausea and vomiting.  Genitourinary: Negative for difficulty urinating and dysuria.  Musculoskeletal: Negative for joint swelling and myalgias.  Skin: Negative for color change and rash.  Neurological: Negative for dizziness and headaches.  Psychiatric/Behavioral: Negative for dysphoric mood.       Increased frustration and stress as outlined.  Denies increased anxiety.        Objective:    Physical Exam Vitals reviewed.  Constitutional:      General: She is not in acute distress.    Appearance: Normal appearance.  HENT:     Head: Normocephalic and atraumatic.     Right Ear: External ear normal.     Left Ear: External ear normal.  Eyes:     General: No scleral icterus.       Right eye: No discharge.        Left eye: No discharge.     Conjunctiva/sclera: Conjunctivae normal.  Neck:     Thyroid: No thyromegaly.  Cardiovascular:     Rate and Rhythm: Normal rate and regular rhythm.  Pulmonary:     Effort: No respiratory distress.     Breath sounds: Normal breath sounds. No wheezing.  Abdominal:     General: Bowel sounds are normal.     Palpations: Abdomen is soft.     Tenderness: There is no abdominal tenderness.  Musculoskeletal:        General: No swelling or tenderness.     Cervical back: Neck supple. No tenderness.  Lymphadenopathy:     Cervical: No cervical adenopathy.  Skin:    Findings: No erythema or rash.  Neurological:     Mental Status: She is alert.  Psychiatric:        Mood and Affect: Mood normal.        Behavior: Behavior normal.     BP 110/70   Pulse 82   Temp 98.2 F (36.8 C) (Oral)   Resp 16   Wt 158 lb 9.6 oz (71.9 kg)   SpO2 99%   BMI 26.39 kg/m  Wt Readings  from Last 3 Encounters:  08/11/20 158 lb 9.6 oz (71.9 kg)  08/04/20 158 lb (71.7 kg)  07/09/20 158 lb (71.7 kg)     Lab Results  Component Value Date   WBC 7.0 07/09/2020   HGB 13.3 07/09/2020   HCT 40.7 07/09/2020   PLT 314.0 07/09/2020   GLUCOSE 91 07/09/2020   ALT 16 01/29/2020   AST 18 01/29/2020   NA 138 07/09/2020   K 4.2 07/09/2020   CL 103 07/09/2020  CREATININE 0.76 07/09/2020   BUN 8 07/09/2020   CO2 28 07/09/2020   TSH 1.51 01/29/2020    DG ESOPHAGUS W DOUBLE CM (HD)  Result Date: 07/05/2020 CLINICAL DATA:  Trouble swallowing. EXAM: ESOPHOGRAM / BARIUM SWALLOW / BARIUM TABLET STUDY TECHNIQUE: Combined double contrast and single contrast examination performed using effervescent crystals, thick barium liquid, and thin barium liquid. The patient was observed with fluoroscopy swallowing a 13 mm barium sulphate tablet. FLUOROSCOPY TIME:  Fluoroscopy Time:  30 seconds Radiation Exposure Index (if provided by the fluoroscopic device): 1.5 mGy Number of Acquired Spot Images: 0 COMPARISON:  None. FINDINGS: Normal pharyngeal anatomy and motility. Contrast flowed freely through the esophagus without evidence of a stricture or mass. Normal esophageal mucosa without evidence of irregularity or ulceration. Esophageal motility was normal. Mild gastroesophageal reflux. No definite hiatal hernia was demonstrated. At the end of the examination a 13 mm barium tablet was administered which transited through the esophagus and esophagogastric junction without delay. IMPRESSION: Mild gastroesophageal reflux.  Otherwise normal barium swallow. Electronically Signed   By: Elige Ko   On: 07/05/2020 12:32       Assessment & Plan:   Problem List Items Addressed This Visit    Weakness    Persistent issue for her.  Not able to be as active.  Walking.  Has had extensive w/up and evaluation as outlined.  Just had f/u with cardiology.  Saw EP.  No further w/up felt warranted.  Discussed with her  today.  Discussed unclear etiology of symptoms.  Recommend intermittent f/u to confirm no change.  Pt comfortable with this plan.        SOB (shortness of breath)    Cardiology evaluation as outlined.  Has had pulmonary w/up.  Diagnosed with mild asthma. Felt worse on inhalers.  Discussed following and monitoring symptoms.  Agreeable.        Muscle twitching    Evaluated by neurology.  Diagnosed with benign fasciculations syndrome. Discussed intermittent f/u with neurology to confirm no change.  She is agreeable with this plan.        Fatigue    Recent labs unrevealing. W/up unrevealing.  Continue walking.  Follow.       Anxiety    Has seen Dr Maryruth Bun.  On no medication now.  Discussed that these symptoms and dealing with the fatigue and not feeling well, can create some anxiety.  She agrees.  Follow.            Dale Rawlins, MD

## 2020-08-11 NOTE — Telephone Encounter (Signed)
Pt seen today

## 2020-08-20 ENCOUNTER — Ambulatory Visit: Payer: Self-pay | Admitting: Internal Medicine

## 2020-08-22 ENCOUNTER — Encounter: Payer: Self-pay | Admitting: Internal Medicine

## 2020-08-22 NOTE — Assessment & Plan Note (Signed)
Recent labs unrevealing. W/up unrevealing.  Continue walking.  Follow.

## 2020-08-22 NOTE — Assessment & Plan Note (Signed)
Persistent issue for her.  Not able to be as active.  Walking.  Has had extensive w/up and evaluation as outlined.  Just had f/u with cardiology.  Saw EP.  No further w/up felt warranted.  Discussed with her today.  Discussed unclear etiology of symptoms.  Recommend intermittent f/u to confirm no change.  Pt comfortable with this plan.

## 2020-08-22 NOTE — Assessment & Plan Note (Signed)
Evaluated by neurology.  Diagnosed with benign fasciculations syndrome. Discussed intermittent f/u with neurology to confirm no change.  She is agreeable with this plan.

## 2020-08-22 NOTE — Assessment & Plan Note (Signed)
Has seen Dr Maryruth Bun.  On no medication now.  Discussed that these symptoms and dealing with the fatigue and not feeling well, can create some anxiety.  She agrees.  Follow.

## 2020-08-22 NOTE — Assessment & Plan Note (Signed)
Cardiology evaluation as outlined.  Has had pulmonary w/up.  Diagnosed with mild asthma. Felt worse on inhalers.  Discussed following and monitoring symptoms.  Agreeable.

## 2020-09-08 ENCOUNTER — Encounter: Payer: Self-pay | Admitting: Internal Medicine

## 2020-09-08 ENCOUNTER — Ambulatory Visit: Payer: Self-pay | Admitting: Internal Medicine

## 2020-09-08 NOTE — Telephone Encounter (Signed)
LMTCB and discuss my chart message.

## 2020-09-09 NOTE — Telephone Encounter (Signed)
See other my chart message for response.  She has an appt with me tomorrow.  Agree with need for evaluation if acute symptoms or problems.

## 2020-09-09 NOTE — Telephone Encounter (Signed)
I have left a detailed message for patient letting her know if anything more acute- needs to be evaluated today at acute care. She has an appointment scheduled with you at 1:30 09/10/20.

## 2020-09-09 NOTE — Telephone Encounter (Signed)
Per review, she does have an appt with me tomorrow.  Agree with need for evaluation if anything acute.

## 2020-09-10 ENCOUNTER — Other Ambulatory Visit: Payer: Self-pay

## 2020-09-10 ENCOUNTER — Ambulatory Visit (INDEPENDENT_AMBULATORY_CARE_PROVIDER_SITE_OTHER): Payer: Self-pay | Admitting: Internal Medicine

## 2020-09-10 ENCOUNTER — Ambulatory Visit (INDEPENDENT_AMBULATORY_CARE_PROVIDER_SITE_OTHER): Payer: Self-pay

## 2020-09-10 VITALS — BP 118/70 | HR 74 | Temp 97.0°F | Resp 20 | Wt 158.0 lb

## 2020-09-10 DIAGNOSIS — R0602 Shortness of breath: Secondary | ICD-10-CM

## 2020-09-10 DIAGNOSIS — R531 Weakness: Secondary | ICD-10-CM

## 2020-09-10 DIAGNOSIS — F419 Anxiety disorder, unspecified: Secondary | ICD-10-CM

## 2020-09-10 DIAGNOSIS — R0789 Other chest pain: Secondary | ICD-10-CM

## 2020-09-10 DIAGNOSIS — R253 Fasciculation: Secondary | ICD-10-CM

## 2020-09-10 NOTE — Progress Notes (Signed)
Patient ID: Tonya Harris, female   DOB: 18-Jan-1981, 39 y.o.   MRN: [id number]   Subjective:    Patient ID: Tonya Harris, female    DOB: 1981-05-03, 39 y.o.   MRN: [id number]  HPI This visit occurred during the SARS-CoV-2 public health emergency.  Safety protocols were in place, including screening questions prior to the visit, additional usage of staff PPE, and extensive cleaning of exam room while observing appropriate contact time as indicated for disinfecting solutions.  Patient here for a work in appt. She has been evaluated recently for increased sob, chest tightness, DOE, weakness, increased heart rate and muscle twitches.  Refer to previous note for details.  Has seen neurology, rheumatology, cardiology and pulmonary.  Previous stress echo and 21 day monitor - negative for any abnormalities.  Saw EP.  No further w/up felt warranted.  Neurology diagnosed with benign fasciculations syndrome.  Has raynaud's.  Pulmonary diagnosed her with mild asthma.  Symptoms worsened with inhalers.  Has seen psychiatry.  Treated for anxiety.  States she is back on medication now and just had f/u.  Does not feel like she is having increased anxiety.  Has noticed over this past week that she feels like her symptoms are worsening.  States she feels she can't catch her breath.  Feels like her heart is going to beat out of her chest - at times.  States when she bends over, feels like her air is being cut off.  Some tingling in her hands.  Also describes a needle like sensation that shoots into her head and then straight into her heart.  Was questioning if she could have some nerve issue affecting her heart.  She is eating.  No nausea or vomiting.  Bowels are moving.    Past Medical History:  Diagnosis Date  . Gestational diabetes    Past Surgical History:  Procedure Laterality Date  . APPENDECTOMY     Family History  Problem Relation Age of Onset  . Hypertension Mother   . Hypertension Father     Social History   Socioeconomic History  . Marital status: Married    Spouse name: Not on file  . Number of children: 4  . Years of education: Not on file  . Highest education level: Master's degree (e.g., MA, MS, MEng, MEd, MSW, MBA)  Occupational History  . 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 Smoking status: Never Smoker  . Smokeless tobacco: Never Used  Vaping Use  . Vaping Use: Never used  Substance and Sexual Activity  . Alcohol use: Not Currently  . Drug use: Not Currently  . Sexual activity: Yes    Partners: Male  Other Topics Concern  . 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 Difficulty of Paying Living Expenses: Not on file  Food Insecurity:   . Worried About Programme researcher, broadcasting/film/video in the Last Year: Not on file  . Ran Out of Food in the Last Year: Not on file  Transportation Needs:   . Lack of Transportation (Medical): Not on file  . Lack of Transportation (Non-Medical): Not on file  Physical Activity:   . Days of Exercise per Week: Not on file  . Minutes of  Exercise per Session: Not on file  Stress:   . Feeling of Stress : Not on file  Social Connections:   . Frequency of Communication with Friends and Family: Not on file  . Frequency of Social Gatherings with Friends and Family: Not on file  . Attends Religious Services: Not on file  . Active Member of Clubs or Organizations: Not on file  . Attends Banker Meetings: Not on file  . Marital Status: Not on file    Outpatient Encounter Medications as of 09/10/2020  Medication Sig  . fluticasone furoate-vilanterol (BREO ELLIPTA) 200-25 MCG/INH AEPB Inhale 1 puff into the lungs daily.  Marland Kitchen levalbuterol (XOPENEX HFA) 45 MCG/ACT inhaler Inhale 2 puffs into the lungs every 8 (eight) hours as needed for wheezing  or shortness of breath.  . Multiple Vitamin (MULTIVITAMIN) tablet Take 1 tablet by mouth daily.  Marland Kitchen omeprazole (PRILOSEC) 40 MG capsule TAKE 1 CAPSULE (40 MG TOTAL) BY MOUTH EVERY EVENING.   No facility-administered encounter medications on file as of 09/10/2020.    Review of Systems  Constitutional: Positive for fatigue. Negative for appetite change and unexpected weight change.  HENT: Negative for congestion and sinus pressure.   Respiratory: Positive for shortness of breath. Negative for cough and chest tightness.   Cardiovascular: Positive for chest pain. Negative for leg swelling.       Increased heart rate.    Gastrointestinal: Negative for abdominal pain, diarrhea, nausea and vomiting.  Genitourinary: Negative for difficulty urinating and dysuria.  Musculoskeletal: Negative for joint swelling.       Feels like she has to sit down and rest with increased activity.   Skin: Negative for color change and rash.  Neurological: Negative for dizziness and light-headedness.       Reports the shooting pains into her head as outlined.    Psychiatric/Behavioral: Negative for agitation and dysphoric mood.       Objective:    Physical Exam Constitutional:      General: She is not in acute distress.    Appearance: Normal appearance.     Comments: Able to talk and complete sentences without increased sob.   HENT:     Head: Normocephalic and atraumatic.     Right Ear: External ear normal.     Left Ear: External ear normal.  Eyes:     General: No scleral icterus.       Right eye: No discharge.        Left eye: No discharge.     Conjunctiva/sclera: Conjunctivae normal.  Neck:     Thyroid: No thyromegaly.  Cardiovascular:     Rate and Rhythm: Normal rate and regular rhythm.  Pulmonary:     Effort: No respiratory distress.     Breath sounds: Normal breath sounds. No wheezing.  Abdominal:     General: Bowel sounds are normal.     Palpations: Abdomen is soft.     Tenderness: There is  no abdominal tenderness.  Musculoskeletal:        General: No swelling or tenderness.     Cervical back: Neck supple. No tenderness.  Lymphadenopathy:     Cervical: No cervical adenopathy.  Skin:    Findings: No erythema or rash.  Neurological:     Mental Status: She is alert.  Psychiatric:        Mood and Affect: Mood normal.        Behavior: Behavior normal.   not orthostatic on exam.   5 minute walk -  pulse ox 99%  BP 118/70   Pulse 74   Temp (!) 97 F (36.1 C)   Resp 20   Wt 158 lb (71.7 kg)   SpO2 99%   BMI 26.29 kg/m  Wt Readings from Last 3 Encounters:  09/10/20 158 lb (71.7 kg)  08/11/20 158 lb 9.6 oz (71.9 kg)  08/04/20 158 lb (71.7 kg)     Lab Results  Component Value Date   WBC 11.3 (H) 09/13/2020   HGB 13.7 09/13/2020   HCT 42.4 09/13/2020   PLT 321 09/13/2020   GLUCOSE 91 07/09/2020   ALT 16 01/29/2020   AST 18 01/29/2020   NA 138 07/09/2020   K 4.2 07/09/2020   CL 103 07/09/2020   CREATININE 0.76 07/09/2020   BUN 8 07/09/2020   CO2 28 07/09/2020   TSH 1.51 01/29/2020    DG ESOPHAGUS W DOUBLE CM (HD)  Result Date: 07/05/2020 CLINICAL DATA:  Trouble swallowing. EXAM: ESOPHOGRAM / BARIUM SWALLOW / BARIUM TABLET STUDY TECHNIQUE: Combined double contrast and single contrast examination performed using effervescent crystals, thick barium liquid, and thin barium liquid. The patient was observed with fluoroscopy swallowing a 13 mm barium sulphate tablet. FLUOROSCOPY TIME:  Fluoroscopy Time:  30 seconds Radiation Exposure Index (if provided by the fluoroscopic device): 1.5 mGy Number of Acquired Spot Images: 0 COMPARISON:  None. FINDINGS: Normal pharyngeal anatomy and motility. Contrast flowed freely through the esophagus without evidence of a stricture or mass. Normal esophageal mucosa without evidence of irregularity or ulceration. Esophageal motility was normal. Mild gastroesophageal reflux. No definite hiatal hernia was demonstrated. At the end of the  examination a 13 mm barium tablet was administered which transited through the esophagus and esophagogastric junction without delay. IMPRESSION: Mild gastroesophageal reflux.  Otherwise normal barium swallow. Electronically Signed   By: Elige Ko   On: 07/05/2020 12:32       Assessment & Plan:   Problem List Items Addressed This Visit    Weakness    Persistent issue for her.  Has seen neurology, rheumatology and cardiology.  Extensive w/up.  No focal neurological changes on exam. Not orthostatic on exam.  EKG without acute changes.  She expressed to f/u with neurology for further evaluation.        SOB (shortness of breath) - Primary    Has had pulmonary and cardiology w/up as outlined.  Given worsening symptoms repeat EKG.  EKG - SR with no acute ischemic changes.  Not orthostatic on exam.  Pulse ox remained 99% with walking.  Will check cxr.  She expressed desire for f/u with cardiology to confirm no further cardiac w/up warranted.        Relevant Orders   EKG 12-Lead (Completed)   DG Chest 2 View (Completed)   Muscle twitching    Has been diagnosed with benign fasciculations syndrome.  Does not appear to have progressed. Follow.  Plan f/u with neurology as outlined.       Chest discomfort    Has had extensive w/up as outlined.  Unclear etiology.  EKG today does not reveal any acute change.  Check cxr.  Pulse ox 99% with walk.  F/u with cardiology as planned.        Anxiety    Is seeing Dr Maryruth Bun.  States she is back on medication now.  Discussed will need to control anxiety that can be related to these worsening symptoms and unknown etiology.  She agrees.  F/u with Dr Maryruth Bun.  Alberto Schoch, MD 

## 2020-09-13 ENCOUNTER — Encounter: Payer: Self-pay | Admitting: Internal Medicine

## 2020-09-13 ENCOUNTER — Emergency Department (HOSPITAL_COMMUNITY)
Admission: EM | Admit: 2020-09-13 | Discharge: 2020-09-13 | Disposition: A | Discharge status: Home / Self Care | Payer: Self-pay | Attending: Emergency Medicine | Admitting: Emergency Medicine

## 2020-09-13 ENCOUNTER — Other Ambulatory Visit: Payer: Self-pay

## 2020-09-13 ENCOUNTER — Emergency Department (HOSPITAL_COMMUNITY): Payer: Self-pay

## 2020-09-13 DIAGNOSIS — R079 Chest pain, unspecified: Secondary | ICD-10-CM | POA: Insufficient documentation

## 2020-09-13 DIAGNOSIS — Z5321 Procedure and treatment not carried out due to patient leaving prior to being seen by health care provider: Secondary | ICD-10-CM | POA: Insufficient documentation

## 2020-09-13 DIAGNOSIS — R531 Weakness: Secondary | ICD-10-CM | POA: Insufficient documentation

## 2020-09-13 DIAGNOSIS — R0602 Shortness of breath: Secondary | ICD-10-CM | POA: Insufficient documentation

## 2020-09-13 LAB — BASIC METABOLIC PANEL
Anion gap: 11 (ref 5–15)
BUN: 10 mg/dL (ref 6–20)
CO2: 19 mmol/L — ABNORMAL LOW (ref 22–32)
Calcium: 9.4 mg/dL (ref 8.9–10.3)
Chloride: 108 mmol/L (ref 98–111)
Creatinine, Ser: 0.75 mg/dL (ref 0.44–1.00)
GFR, Estimated: >60 mL/min (ref >60)
Glucose, Bld: 90 mg/dL (ref 70–99)
Potassium: 3.8 mmol/L (ref 3.5–5.1)
Sodium: 138 mmol/L (ref 135–145)

## 2020-09-13 LAB — CBC
HCT: 42.4 % (ref 36.0–46.0)
Hemoglobin: 13.7 g/dL (ref 12.0–15.0)
MCH: 28.1 pg (ref 26.0–34.0)
MCHC: 32.3 g/dL (ref 30.0–36.0)
MCV: 87.1 fL (ref 80.0–100.0)
Platelets: 321 10*3/uL (ref 150–400)
RBC: 4.87 MIL/uL (ref 3.87–5.11)
RDW: 12.9 % (ref 11.5–15.5)
WBC: 11.3 10*3/uL — ABNORMAL HIGH (ref 4.0–10.5)
nRBC: 0 % (ref 0.0–0.2)

## 2020-09-13 LAB — TROPONIN I (HIGH SENSITIVITY)
Troponin I (High Sensitivity): 2 ng/L (ref <18)
Troponin I (High Sensitivity): 2 ng/L (ref <18)

## 2020-09-13 LAB — I-STAT BETA HCG BLOOD, ED (MC, WL, AP ONLY): I-stat hCG, quantitative: <5 m[IU]/mL (ref <5)

## 2020-09-13 NOTE — ED Triage Notes (Signed)
Pt here from home with c/o weakness chest pain and sob , pt had a x ray done on the 5th that was ok , pt sent over for more blood work and and possible ct of chest

## 2020-09-13 NOTE — Assessment & Plan Note (Signed)
Is seeing Dr Maryruth Bun.  States she is back on medication now.  Discussed will need to control anxiety that can be related to these worsening symptoms and unknown etiology.  She agrees.  F/u with Dr Maryruth Bun.

## 2020-09-13 NOTE — Assessment & Plan Note (Signed)
Has had extensive w/up as outlined.  Unclear etiology.  EKG today does not reveal any acute change.  Check cxr.  Pulse ox 99% with walk.  F/u with cardiology as planned.

## 2020-09-13 NOTE — Assessment & Plan Note (Signed)
Has had pulmonary and cardiology w/up as outlined.  Given worsening symptoms repeat EKG.  EKG - SR with no acute ischemic changes.  Not orthostatic on exam.  Pulse ox remained 99% with walking.  Will check cxr.  She expressed desire for f/u with cardiology to confirm no further cardiac w/up warranted.

## 2020-09-13 NOTE — ED Notes (Signed)
No reply for room x4 

## 2020-09-13 NOTE — Assessment & Plan Note (Signed)
Has been diagnosed with benign fasciculations syndrome.  Does not appear to have progressed. Follow.  Plan f/u with neurology as outlined.

## 2020-09-13 NOTE — Assessment & Plan Note (Signed)
Persistent issue for her.  Has seen neurology, rheumatology and cardiology.  Extensive w/up.  No focal neurological changes on exam. Not orthostatic on exam.  EKG without acute changes.  She expressed to f/u with neurology for further evaluation.

## 2020-09-14 ENCOUNTER — Encounter: Payer: Self-pay | Admitting: Internal Medicine

## 2020-09-14 NOTE — Telephone Encounter (Signed)
Noted  

## 2020-09-14 NOTE — Telephone Encounter (Signed)
Please call her and let her know that I agree with CT scan - see result note and let her know that CT would be the next step.  (did not do yesterday).

## 2020-09-15 ENCOUNTER — Other Ambulatory Visit: Payer: Self-pay | Admitting: Internal Medicine

## 2020-09-15 DIAGNOSIS — R0602 Shortness of breath: Secondary | ICD-10-CM

## 2020-09-15 DIAGNOSIS — R0789 Other chest pain: Secondary | ICD-10-CM

## 2020-09-15 DIAGNOSIS — R9389 Abnormal findings on diagnostic imaging of other specified body structures: Secondary | ICD-10-CM

## 2020-09-15 DIAGNOSIS — R071 Chest pain on breathing: Secondary | ICD-10-CM

## 2020-09-15 NOTE — Progress Notes (Signed)
Order placed for CT chest.  °

## 2020-09-21 ENCOUNTER — Ambulatory Visit: Payer: Self-pay | Admitting: Internal Medicine

## 2020-09-22 ENCOUNTER — Telehealth (INDEPENDENT_AMBULATORY_CARE_PROVIDER_SITE_OTHER): Payer: Self-pay | Admitting: Internal Medicine

## 2020-09-22 ENCOUNTER — Encounter: Payer: Self-pay | Admitting: Internal Medicine

## 2020-09-22 DIAGNOSIS — F419 Anxiety disorder, unspecified: Secondary | ICD-10-CM

## 2020-09-22 DIAGNOSIS — J4530 Mild persistent asthma, uncomplicated: Secondary | ICD-10-CM

## 2020-09-22 DIAGNOSIS — R0602 Shortness of breath: Secondary | ICD-10-CM

## 2020-09-23 NOTE — Progress Notes (Signed)
Patient ID: Tonya Harris, female   DOB: 06-06-1981, 39 y.o.   MRN: [id number]   Virtual Visit via telephone Note  This visit type was conducted due to national recommendations for restrictions regarding the COVID-19 pandemic (e.g. social distancing).  This format is felt to be most appropriate for this patient at this time.  All issues noted in this document were discussed and addressed.  No physical exam was performed (except for noted visual exam findings with Video Visits).   I connected with Tonya Harris by telephone and verified that I am speaking with the correct person using two identifiers. Location patient: home Location provider: work  Persons participating in the telephone visit: patient, provider  The limitations, risks, security and privacy concerns of performing an evaluation and management service by telephone and the availability of in person appointments have been discussed.  It has also been discussed with the patient that there may be a patient responsible charge related to this service. The patient expressed understanding and agreed to proceed.   Reason for visit: scheduled follow up.   HPI: Scheduled follow up.  She has been evaluated for increased sob, chest tightness, DOE, weakness, increased heart rate and muscle twitches.  Refer to previous note for details.  Previous w/up as outlined in last note.  Stress echo nad 21 day monitor - negative for abnormalities.  Saw EP.  Felt no further w/up warranted.  Given worsening sob last visit, CXR revealed mild airways thickening felt possibly related to history of asthma.  No other acute abnormality.  Given symptoms and cxr findings, decided to obtain CT chest.  Chest CT obtained and revealed small faint micronodular infiltrate in the left lower lobe.  She denies any fever.  No increased cough or congestion.  No acute infectious symptoms.  Actually feels better than she did last visit.  Still notices sob with stairs, etc.   Still reports sob and weakness.  Discussed CT results.  Discussed f/u with pulmonary.  Eating.  No nausea/vomiting reported.    ROS: See pertinent positives and negatives per HPI.  Past Medical History:  Diagnosis Date  . Gestational diabetes     Past Surgical History:  Procedure Laterality Date  . APPENDECTOMY      Family History  Problem Relation Age of Onset  . Hypertension Mother   . Hypertension Father     SOCIAL HX: reviewed.    Current Outpatient Medications:  .  fluticasone furoate-vilanterol (BREO ELLIPTA) 200-25 MCG/INH AEPB, Inhale 1 puff into the lungs daily., Disp: 60 each, Rfl: 1 .  levalbuterol (XOPENEX HFA) 45 MCG/ACT inhaler, Inhale 2 puffs into the lungs every 8 (eight) hours as needed for wheezing or shortness of breath., Disp: 1 Inhaler, Rfl: 12 .  Multiple Vitamin (MULTIVITAMIN) tablet, Take 1 tablet by mouth daily., Disp: , Rfl:  .  omeprazole (PRILOSEC) 40 MG capsule, TAKE 1 CAPSULE (40 MG TOTAL) BY MOUTH EVERY EVENING., Disp: 90 capsule, Rfl: 1  EXAM:  GENERAL: alert.  Sounds to be in no acute distress.  Answering question appropriately.    HEENT: atraumatic, conjunttiva clear, no obvious abnormalities on inspection of external nose and ears  NECK: normal movements of the head and neck  LUNGS: on inspection no signs of respiratory distress, breathing rate appears normal, no obvious gross SOB, gasping or wheezing  CV: no obvious cyanosis  PSYCH/NEURO: pleasant and cooperative, no obvious depression or anxiety, speech and thought processing grossly intact  ASSESSMENT AND PLAN:  Discussed the following  assessment and plan:  Problem List Items Addressed This Visit    SOB (shortness of breath)    Previous pulmonary and cardiology w/up as outlined.  Recent cxr as outlined.  Subsequent CT chest - as outlined.  Will have pulmonary evaluate with questions of need for further treatment and evaluation.  Discussed f/u with cardiology to confirm no  restrictive cardiac pericarditis, etc.        Asthma, mild persistent    Diagnosed with mild asthma.  Intolerant to trelegy and breo.  No increased cough or congestion.  Follow.       Anxiety    Seeing Dr Maryruth Bun. Per note, had started back on zoloft.  Overall feels is stable.  Follow.            I discussed the assessment and treatment plan with the patient. The patient was provided an opportunity to ask questions and all were answered. The patient agreed with the plan and demonstrated an understanding of the instructions.   The patient was advised to call back or seek an in-person evaluation if the symptoms worsen or if the condition fails to improve as anticipated.  I provided 25 minutes of non-face-to-face time during this encounter.   Dale [place], MD

## 2020-09-24 ENCOUNTER — Telehealth: Payer: Self-pay

## 2020-09-24 NOTE — Telephone Encounter (Signed)
Pt states that she is unable to get a cardiac MRI until January and she is feeling bad today and wants something a lot sooner than January. She wants to speak with you directly today.  Pt states that she does not need triage-just a call back about sooner appt for MRI

## 2020-09-24 NOTE — Telephone Encounter (Signed)
Cardiac MRI was ordered by cardiology. She is going to call them

## 2020-09-26 ENCOUNTER — Encounter: Payer: Self-pay | Admitting: Internal Medicine

## 2020-09-26 NOTE — Assessment & Plan Note (Signed)
Seeing Dr Maryruth Bun. Per note, had started back on zoloft.  Overall feels is stable.  Follow.

## 2020-09-26 NOTE — Assessment & Plan Note (Signed)
Diagnosed with mild asthma.  Intolerant to trelegy and breo.  No increased cough or congestion.  Follow.

## 2020-09-26 NOTE — Assessment & Plan Note (Signed)
Previous pulmonary and cardiology w/up as outlined.  Recent cxr as outlined.  Subsequent CT chest - as outlined.  Will have pulmonary evaluate with questions of need for further treatment and evaluation.  Discussed f/u with cardiology to confirm no restrictive cardiac pericarditis, etc.

## 2020-09-27 ENCOUNTER — Telehealth: Payer: Self-pay | Admitting: Cardiology

## 2020-09-27 ENCOUNTER — Other Ambulatory Visit: Payer: Self-pay | Admitting: Pulmonary Disease

## 2020-09-27 ENCOUNTER — Ambulatory Visit
Admission: RE | Admit: 2020-09-27 | Discharge: 2020-09-27 | Disposition: A | Discharge status: Home / Self Care | Payer: Self-pay | Source: Ambulatory Visit | Attending: Pulmonary Disease | Admitting: Pulmonary Disease

## 2020-09-27 DIAGNOSIS — R072 Precordial pain: Secondary | ICD-10-CM

## 2020-09-27 DIAGNOSIS — R0602 Shortness of breath: Secondary | ICD-10-CM

## 2020-09-27 NOTE — Telephone Encounter (Signed)
Patient states that [facility] ordered her to have a Cardiac MRI, but is not able to have it performed until January. She asks if we can order it to be performed earlier. Patient is a patient of Dr. Lalla Brothers. Please advise If this can be done.

## 2020-09-28 ENCOUNTER — Telehealth: Payer: Self-pay | Admitting: Cardiology

## 2020-09-28 NOTE — Telephone Encounter (Signed)
Spoke with patient regarding appointment scheduled Tuesday 10/12/20 at 7:00 am at [facility]----arrival time is 6:30 am --1st floor admissions office.  Patient voiced her understanding

## 2020-09-28 NOTE — Telephone Encounter (Signed)
Sent mychart message to Pt advising had ordered cardiac MRI per [facility]

## 2020-10-04 ENCOUNTER — Encounter: Payer: Self-pay | Admitting: Pulmonary Disease

## 2020-10-04 ENCOUNTER — Ambulatory Visit (INDEPENDENT_AMBULATORY_CARE_PROVIDER_SITE_OTHER): Payer: Self-pay | Admitting: Pulmonary Disease

## 2020-10-04 ENCOUNTER — Other Ambulatory Visit: Payer: Self-pay

## 2020-10-04 VITALS — BP 102/68 | HR 82 | Temp 97.1°F | Ht 65.0 in | Wt 163.6 lb

## 2020-10-04 DIAGNOSIS — J4520 Mild intermittent asthma, uncomplicated: Secondary | ICD-10-CM

## 2020-10-04 DIAGNOSIS — R059 Cough, unspecified: Secondary | ICD-10-CM

## 2020-10-04 DIAGNOSIS — R0602 Shortness of breath: Secondary | ICD-10-CM

## 2020-10-04 DIAGNOSIS — G901 Familial dysautonomia [Riley-Day]: Secondary | ICD-10-CM

## 2020-10-04 MED ORDER — AZITHROMYCIN 250 MG PO TABS
ORAL_TABLET | ORAL | 0 refills | Status: AC
Start: 2020-10-04 — End: 2020-10-09

## 2020-10-04 NOTE — Patient Instructions (Signed)
I have sent a prescription of Zithromax which is an antibiotic to the [facility] for you.  Take it as directed in the package.  We are scheduling a cardiopulmonary stress test for you.  We will see you in follow-up in 3 months time call sooner should any new problems arise.

## 2020-10-04 NOTE — Progress Notes (Signed)
Subjective:    Patient ID: Tonya Harris, female    DOB: [birth year], 39 y.o.   MRN: [id number]  HPI Tonya Harris is a 39 year old lifelong never smoker who we have been evaluating here for the issue of dyspnea.  She has issues with very mild airways reactivity however is intolerant of maintenance inhalers.  She uses Xopenex as rescue.  Has not been able to use Breo Ellipta due to jitteriness.  She has been having some issues with worsening dyspnea and a sensation of palpitations.  Previously she has had a Holter monitor that showed some issues with sinus tachycardia up to the 150s but no significant pathologic arrhythmia.  As noted her pulmonary issues are very minimal.  Of her dyspnea she had a CT scan of the chest performed on 12 November at [place].  She had no PE there was a minimal area of groundglass noted in the lingula.  She has noted some increasing cough with clear mucus.  No fevers or chills.  Her children have been ill with cough and upper respiratory type symptoms for the last 2 to 3 weeks.  She states they "keep bringing colds home".    Overall today her description of her shortness of breath is mostly associated with episodes of tachypalpitations.  She also notes some left-sided chest pain associated with this.  She recently had an cardiac MRI on 23 November was normal.  Data obtained: 02/17/2020: 2D echocardiogram>> EF 65 to 70% normal left ventricular wall motion.  Somewhat hyperdynamic.  Otherwise no valvular disease or abnormalities. 03/01/2020: PFTs>> FEV1 3.12 L or 98% predicted with significant bronchodilator response with an net change of 16%.  FVC 4.19 L or 108% predicted.  FEV1/FVC 75%.  There is mild hyperinflation and moderate air trapping consistent with obstructive airways disease of mild nature.  Diffusion capacity normal.  09/17/2020: CT chest >> NOVANT, no PE, very small patchy groundglass in the lingula. 09/28/2020: Cardiac MRI>> Per report, normal.  Review of  Systems A 10 point review of systems was performed and it is as noted above otherwise negative.  Patient Active Problem List   Diagnosis Date Noted  . Chest discomfort 07/06/2020  . Asthma, mild persistent 06/28/2020  . Fatigue 01/29/2020  . Dysphagia 01/12/2020  . Elevated blood pressure reading 12/20/2019  . Palpitations 10/19/2019  . Hypoglycemia 09/07/2019  . Cough 07/11/2019  . SOB (shortness of breath) 06/30/2019  . Weakness 06/22/2019  . Coccygeal pain 04/12/2019  . Abdominal pain 12/22/2018  . Healthcare maintenance 12/19/2018  . Sweating abnormality 09/08/2018  . History of gestational diabetes 09/08/2018  . Anxiety 09/08/2018  . Muscle ache 09/02/2018  . Muscle twitching 09/02/2018  . ANAL PRURITUS 09/27/2009   No Known Allergies  Current Meds  Medication Sig  . levalbuterol (XOPENEX HFA) 45 MCG/ACT inhaler Inhale 2 puffs into the lungs every 8 (eight) hours as needed for wheezing or shortness of breath.  . Multiple Vitamin (MULTIVITAMIN) tablet Take 1 tablet by mouth daily.  Marland Kitchen omeprazole (PRILOSEC) 40 MG capsule TAKE 1 CAPSULE (40 MG TOTAL) BY MOUTH EVERY EVENING.   Immunization History  Administered Date(s) Administered  . Influenza Split 08/07/2015  . Influenza,inj,Quad PF,6+ Mos 08/13/2019  . Influenza-Unspecified 10/01/2020  . PFIZER SARS-COV-2 Vaccination 01/23/2020, 02/23/2020  . Tdap 04/06/2017        Objective:   Physical Exam BP 102/68 (BP Location: Left Arm, Cuff Size: Normal)   Pulse 82   Temp (!) 97.1 F (36.2 C) (Temporal)   Ht  5\' 5"  (1.651 m)   Wt 163 lb 9.6 oz (74.2 kg)   SpO2 100%   BMI 27.22 kg/m  GENERAL: Well-developed, well-nourished, not as anxious today, no respiratory distress.  Fully ambulatory. HEAD: Normocephalic, atraumatic.  EYES: Pupils equal, round, reactive to light. No scleral icterus.  MOUTH: Nose/mouth/throat not examined due to masking requirements for COVID 19. NECK: Supple. No thyromegaly. No JVD.Trachea  midline, no crepitus. PULMONARY: Lungs clear to auscultation bilaterally. Good air entry bilaterally. CARDIOVASCULAR: S1 and S2. Regular rate and rhythm.   No rubs murmurs gallops heard. GASTROINTESTINAL: Abdomen is soft. MUSCULOSKELETAL: No joint deformity, no clubbing, no edema. Nailbeds pink. NEUROLOGIC: No focal deficit noted.  Awake, alert, oriented, speech is fluent.   SKIN: Intact,warm,dry. Limited exam shows no rashes.  No evidence of Raynaud's. PSYCH: Anxious. Behavior otherwise normal.  Representative slice of CT scan of the chest performed recently with small area of groundglass opacity in the lingula, see arrow:      Assessment & Plan:     ICD-10-CM   1. SOB (shortness of breath)  R06.02 Cardiopulmonary exercise test   Will obtain cardiopulmonary stress test  2. Cough  R05.9    Likely related to mycoplasma infection Small area of groundglass on CT Azithromycin  3. Mild intermittent reactive airway disease without complication  J45.20    Patient not using Breo Continue Xopenex as needed Keep diary of times of need for Xopenex and effect  4. 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 Cardiopulmonary exercise test    Standing Status:   Future    Standing Expiration Date:   10/04/2021    Order Specific Question:   Where should this test be performed?    Answer:   10/06/2021   Meds ordered this encounter  Medications  . azithromycin (ZITHROMAX Z-PAK) 250 MG tablet    Sig: Take 2 tablets (500 mg) on  Day 1,  followed by 1 tablet (250 mg) once daily on Days 2 through 5.    Dispense:  6 each    Refill:  0   Discussion:  The patient has been evaluated previously has been noted to have some mild airways reactivity but her symptoms are way out of proportion to this mild finding.  She actually finds that inhalers do not help her if anything make her too  anxious.  She also was noted to have a very small area of groundglass noted in the lingula on her most recent CT scan of the chest.  She has had sick contacts at home as her children have been coming in with "colds".  I suspect this is possibly a low-grade mycoplasma infection.  She will be treated with a azithromycin.  With regards to her ongoing issues with dyspnea will obtain a cardiopulmonary stress test.  Up in 3 months time she is to contact [facility] prior to that time should any new difficulties arise.  Discussed all the above with Dr. [place] via telephone conversation.  Dale Apison, MD [facility]   *This note was dictated using voice recognition software/Dragon.  Despite best efforts to proofread, errors can occur which can change the meaning.  Any change was purely unintentional.

## 2020-10-12 ENCOUNTER — Other Ambulatory Visit (HOSPITAL_COMMUNITY): Payer: Self-pay

## 2020-10-12 ENCOUNTER — Ambulatory Visit (INDEPENDENT_AMBULATORY_CARE_PROVIDER_SITE_OTHER): Payer: Self-pay | Admitting: Internal Medicine

## 2020-10-12 ENCOUNTER — Other Ambulatory Visit: Payer: Self-pay

## 2020-10-12 ENCOUNTER — Encounter: Payer: Self-pay | Admitting: Internal Medicine

## 2020-10-12 VITALS — BP 110/74 | HR 76 | Temp 98.2°F | Resp 14 | Ht 65.0 in | Wt 161.0 lb

## 2020-10-12 DIAGNOSIS — R002 Palpitations: Secondary | ICD-10-CM

## 2020-10-12 DIAGNOSIS — R0789 Other chest pain: Secondary | ICD-10-CM

## 2020-10-12 DIAGNOSIS — F419 Anxiety disorder, unspecified: Secondary | ICD-10-CM

## 2020-10-12 DIAGNOSIS — R059 Cough, unspecified: Secondary | ICD-10-CM

## 2020-10-12 DIAGNOSIS — R0602 Shortness of breath: Secondary | ICD-10-CM

## 2020-10-12 DIAGNOSIS — R5383 Other fatigue: Secondary | ICD-10-CM

## 2020-10-12 DIAGNOSIS — R253 Fasciculation: Secondary | ICD-10-CM

## 2020-10-12 DIAGNOSIS — Z23 Encounter for immunization: Secondary | ICD-10-CM

## 2020-10-12 DIAGNOSIS — J4530 Mild persistent asthma, uncomplicated: Secondary | ICD-10-CM

## 2020-10-12 NOTE — Progress Notes (Signed)
Patient ID: Tonya Harris, female   DOB: April 24, 1981, 39 y.o.   MRN: [id number]   Subjective:    Patient ID: Tonya Harris, female    DOB: [birth year], 39 y.o.   MRN: [id number]  HPI This visit occurred during the SARS-CoV-2 public health emergency.  Safety protocols were in place, including screening questions prior to the visit, additional usage of staff PPE, and extensive cleaning of exam room while observing appropriate contact time as indicated for disinfecting solutions.  Patient here for a scheduled follow up.  Has been having issues with worsening sob and increased heart rate and fatigue.  See previous note for details.  Previous holter revealed sinus tachycardia, but no significant arrhythmia.  Recently reevaluated by cardiology.  Cardiac MRI - normal.  Also had CT chest - small patchy groundglass changes - lingula. Reevaluated by pulmonary 10/04/20.  Being treated for mycoplasma with azithromycin.  Also planning for cardiopulmonary stress test.  Recommended to continue xopenex as needed for mild reactive airway disease.  Seeing psychiatry.  Increased stress with family.  Prescribed zoloft and risperdal.  Discussed above w/up.  She does report feeling better outside.  Discussed the need to control anxiety related to above symptoms.  She is eating.  No vomiting.  Bowels moving.    Past Medical History:  Diagnosis Date  . Gestational diabetes    Past Surgical History:  Procedure Laterality Date  . APPENDECTOMY     Family History  Problem Relation Age of Onset  . Hypertension Mother   . Hypertension Father    Social History   Socioeconomic History  . Marital status: Married    Spouse name: Not on file  . Number of children: 4  . Years of education: Not on file  . Highest education level: Master's degree (e.g., MA, MS, MEng, MEd, MSW, MBA)  Occupational History  . 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 Smoking status: Never Smoker  . Smokeless  tobacco: Never Used  Vaping Use  . Vaping Use: Never used  Substance and Sexual Activity  . Alcohol use: Not Currently  . Drug use: Not Currently  . Sexual activity: Yes    Partners: Male  Other Topics Concern  . 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 fluticasone furoate-vilanterol (BREO ELLIPTA) 200-25 MCG/INH AEPB Inhale 1 puff into the lungs daily.  Marland Kitchen levalbuterol (XOPENEX HFA) 45 MCG/ACT inhaler Inhale 2 puffs into the lungs every 8 (eight) hours as needed for wheezing or shortness of breath.  . Multiple Vitamin (MULTIVITAMIN) tablet Take 1 tablet by mouth daily.  Marland Kitchen omeprazole (PRILOSEC) 40 MG capsule TAKE 1 CAPSULE (40 MG TOTAL) BY MOUTH EVERY EVENING.   No facility-administered encounter medications on file as of 10/12/2020.    Review of Systems  Constitutional: Positive for fatigue. Negative for appetite change and unexpected weight change.  HENT: Negative for congestion and sinus pressure.   Respiratory: Positive for chest tightness and shortness of breath.   Cardiovascular:  Positive for chest pain and palpitations. Negative for leg swelling.  Gastrointestinal: Negative for abdominal pain, diarrhea, nausea and vomiting.  Genitourinary: Negative for difficulty urinating and dysuria.  Musculoskeletal: Negative for joint swelling and myalgias.  Skin: Negative for color change and rash.  Neurological: Negative for dizziness and headaches.  Psychiatric/Behavioral: Negative for dysphoric mood.       Increased anxiety related to above symptoms.         Objective:    Physical  Exam Vitals reviewed.  Constitutional:      General: She is not in acute distress. HENT:     Head: Normocephalic and atraumatic.     Right Ear: External ear normal.     Left Ear: External ear normal.     Mouth/Throat:     Mouth: Oropharynx is clear and moist.  Eyes:     General: No scleral icterus.       Right eye: No discharge.        Left eye: No discharge.     Conjunctiva/sclera: Conjunctivae normal.  Neck:     Thyroid: No thyromegaly.  Cardiovascular:     Rate and Rhythm: Normal rate and regular rhythm.  Pulmonary:     Effort: No respiratory distress.     Breath sounds: Normal breath sounds. No wheezing.  Abdominal:     General: Bowel sounds are normal.     Palpations: Abdomen is soft.     Tenderness: There is no abdominal tenderness.  Musculoskeletal:        General: No swelling, tenderness or edema.     Cervical back: Neck supple. No tenderness.  Lymphadenopathy:     Cervical: No cervical adenopathy.  Skin:    Findings: No erythema or rash.  Neurological:     Mental Status: She is alert.  Psychiatric:        Mood and Affect: Mood normal.        Behavior: Behavior normal.     BP 110/74 (BP Location: Left Arm, Patient Position: Sitting, Cuff Size: Normal)   Pulse 76   Temp 98.2 F (36.8 C) (Oral)   Resp 14   Ht 5\' 5"  (1.651 m)   Wt 161 lb (73 kg)   SpO2 97%   BMI 26.79 kg/m  Wt Readings from Last 3 Encounters:  10/12/20 161 lb (73 kg)  10/04/20 163 lb 9.6 oz (74.2 kg)  09/10/20 158 lb (71.7 kg)     Lab Results  Component Value Date   WBC 11.3 (H) 09/13/2020   HGB 13.7 09/13/2020   HCT 42.4 09/13/2020   PLT 321 09/13/2020   GLUCOSE 90 09/13/2020   ALT 16 01/29/2020   AST 18 01/29/2020   NA 138 09/13/2020   K 3.8 09/13/2020   CL 108 09/13/2020   CREATININE 0.75 09/13/2020   BUN 10 09/13/2020   CO2 19 (L) 09/13/2020   TSH 1.51 01/29/2020    CT OUTSIDE FILMS CHEST  Result Date: 09/27/2020 This examination belongs to an outside facility  and is stored here for comparison purposes only.  Contact the originating outside institution for any associated report or interpretation.      Assessment & Plan:   Problem List Items Addressed This Visit    SOB (shortness of breath)    Previous pulmonary and cardiac w/up as outlined previously.  Recent cardiac MRI normal.  CT as outlined.  On azithromycin.  Planning for cardiopulmonary stress test.        Palpitations    Sinus  tachycardia noted on Holter.  No significant arrhythmia.  Planning for cardiopulmonary stress test as outlined.  Follow.       Muscle twitching    Has been diagnosed with benign fasciculations syndrome.  No progression.  Follow.        Fatigue    Lab unrevealing.  She is trying to stay active.  Planning for stress test as outlined.        Cough    Being treated for acid reflux and not for possible mycoplasma - azithromycin.  No increased cough currently.  Follow.       Chest discomfort    Recent cardiac MRI normal.  Cardiac w/up so far unrevealing.  Planning for cardiopulmonary stress test as outlined.  CT chest as outlined.  Follow.       Asthma, mild persistent    Diagnosed with mild asthma.  Intolerant to trelegy and breo.  Has xopenex to use prn.  Follow..       Anxiety    Seeing Dr Maryruth Bun.  Prescribed risperdal and zoloft.  Recommended counselor.  Follow.         Other Visit Diagnoses    Need for immunization against influenza    -  Primary   Relevant Orders   Flu Vaccine QUAD 36+ mos IM (Completed)       Dale [place], MD

## 2020-10-18 NOTE — Assessment & Plan Note (Signed)
Recent cardiac MRI normal.  Cardiac w/up so far unrevealing.  Planning for cardiopulmonary stress test as outlined.  CT chest as outlined.  Follow.

## 2020-10-18 NOTE — Assessment & Plan Note (Signed)
Has been diagnosed with benign fasciculations syndrome.  No progression.  Follow.

## 2020-10-18 NOTE — Assessment & Plan Note (Signed)
Previous pulmonary and cardiac w/up as outlined previously.  Recent cardiac MRI normal.  CT as outlined.  On azithromycin.  Planning for cardiopulmonary stress test.

## 2020-10-18 NOTE — Assessment & Plan Note (Signed)
Diagnosed with mild asthma.  Intolerant to trelegy and breo.  Has xopenex to use prn.  Follow.Marland Kitchen

## 2020-10-18 NOTE — Assessment & Plan Note (Signed)
Seeing Dr Maryruth Bun.  Prescribed risperdal and zoloft.  Recommended counselor.  Follow.

## 2020-10-18 NOTE — Assessment & Plan Note (Signed)
Sinus tachycardia noted on Holter.  No significant arrhythmia.  Planning for cardiopulmonary stress test as outlined.  Follow.

## 2020-10-18 NOTE — Assessment & Plan Note (Signed)
Being treated for acid reflux and not for possible mycoplasma - azithromycin.  No increased cough currently.  Follow.

## 2020-10-18 NOTE — Assessment & Plan Note (Signed)
Lab unrevealing.  She is trying to stay active.  Planning for stress test as outlined.

## 2020-10-20 ENCOUNTER — Telehealth: Payer: Self-pay | Admitting: Pulmonary Disease

## 2020-10-20 NOTE — Telephone Encounter (Signed)
Patient is aware of below recommendations.  She voiced her understanding and had no further questions.  Nothing further needed.  

## 2020-10-20 NOTE — Telephone Encounter (Signed)
Spoke to patient, who reports of chest pain, sob, weakness, light headed and feels that she is going to faint. She stated that sx have been present for over a year, however she has days that are worse then others. Denies additional symptoms.  She would like CPX to be sooner then 09/02/2020.  I have spoken to Dr. Jayme Cloud, who recommends ED for eval. Patient is aware and voiced her understanding.  Synetta Fail, can CPX be sooner?

## 2020-10-20 NOTE — Telephone Encounter (Signed)
I do not think that cardiopulmonary stress test can be scheduled sooner.  But we can certainly try.  If she is having increasing symptoms she should go to the ED to be evaluated.  I do not think that this is a pulmonary issue.

## 2020-10-20 NOTE — Telephone Encounter (Signed)
I called and spoke with [facility] and she states that they do not have any cancellations before 11/02/20

## 2020-11-02 ENCOUNTER — Other Ambulatory Visit: Payer: Self-pay

## 2020-11-02 ENCOUNTER — Ambulatory Visit (HOSPITAL_COMMUNITY): Payer: Self-pay | Attending: Pulmonary Disease

## 2020-11-02 DIAGNOSIS — R0602 Shortness of breath: Secondary | ICD-10-CM | POA: Insufficient documentation

## 2020-11-12 ENCOUNTER — Ambulatory Visit (INDEPENDENT_AMBULATORY_CARE_PROVIDER_SITE_OTHER): Payer: Self-pay | Admitting: Internal Medicine

## 2020-11-12 ENCOUNTER — Other Ambulatory Visit: Payer: Self-pay

## 2020-11-12 ENCOUNTER — Ambulatory Visit: Payer: Self-pay | Admitting: Internal Medicine

## 2020-11-12 DIAGNOSIS — R5383 Other fatigue: Secondary | ICD-10-CM

## 2020-11-12 DIAGNOSIS — R0602 Shortness of breath: Secondary | ICD-10-CM

## 2020-11-12 DIAGNOSIS — R531 Weakness: Secondary | ICD-10-CM

## 2020-11-12 DIAGNOSIS — J4530 Mild persistent asthma, uncomplicated: Secondary | ICD-10-CM

## 2020-11-12 DIAGNOSIS — F419 Anxiety disorder, unspecified: Secondary | ICD-10-CM

## 2020-11-12 DIAGNOSIS — J392 Other diseases of pharynx: Secondary | ICD-10-CM

## 2020-11-12 NOTE — Progress Notes (Signed)
Patient ID: Eddie Candle, female   DOB: 1981-03-05, 40 y.o.   MRN: [id number]   Subjective:    Patient ID: Eddie Candle, female    DOB: [birth year], 40 y.o.   MRN: [id number]  HPI This visit occurred during the SARS-CoV-2 public health emergency.  Safety protocols were in place, including screening questions prior to the visit, additional usage of staff PPE, and extensive cleaning of exam room while observing appropriate contact time as indicated for disinfecting solutions.  Patient here for work in appt.  She made this appt to follow up on some ongoing concerns and issues.  Recently had exercise stress test 11/02/20 - normal functional capacity.  No indication for cardiopulmonary abnormality or exercise induced bronchospasm. Planning to follow up with pulmonary.  Did start drinking more water over this past week.  Feeling some better after doing this.  Does report some throat irritation.  No sore throat.  Discussed taking pepcid daily and see if this improves symptoms.  Still with weakness.  Eating.  Able to sleep.  Has f/u with Dr Maryruth Bun today.  Some increased stress related to her ongoing symptoms.  Still with the stinging/shock sensation - various locations on her body.  No rash.  Some ringing in her ears.  No reported hearing loss.  Discussed wearing compression hose.    Past Medical History:  Diagnosis Date  . Gestational diabetes    Past Surgical History:  Procedure Laterality Date  . APPENDECTOMY     Family History  Problem Relation Age of Onset  . Hypertension Mother   . Hypertension Father    Social History   Socioeconomic History  . Marital status: Married    Spouse name: Not on file  . Number of children: 4  . Years of education: Not on file  . Highest education level: Master's degree (e.g., MA, MS, MEng, MEd, MSW, MBA)  Occupational History  . 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 Smoking status: Never Smoker  . Smokeless tobacco:  Never Used  Vaping Use  . Vaping Use: Never used  Substance and Sexual Activity  . Alcohol use: Not Currently  . Drug use: Not Currently  . Sexual activity: Yes    Partners: Male  Other Topics Concern  . 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 fluticasone furoate-vilanterol (BREO ELLIPTA) 200-25 MCG/INH AEPB Inhale 1 puff into the lungs daily.  Marland Kitchen levalbuterol (XOPENEX HFA) 45 MCG/ACT inhaler Inhale 2 puffs into the lungs every 8 (eight) hours as needed for wheezing or shortness of breath.  . Multiple Vitamin (MULTIVITAMIN) tablet Take 1 tablet by mouth daily.  Marland Kitchen omeprazole (PRILOSEC) 40 MG capsule TAKE 1 CAPSULE (40 MG TOTAL) BY MOUTH EVERY EVENING.   No facility-administered encounter medications on file as of 11/12/2020.    Review of Systems  Constitutional: Positive for fatigue. Negative for appetite change and unexpected weight change.  HENT: Positive for tinnitus. Negative for congestion and sinus pressure.   Respiratory:  Negative for cough.        No increased cough.  Breathing appears to be stable overall.    Cardiovascular: Negative for chest pain and leg swelling.  Gastrointestinal: Negative for abdominal pain, diarrhea, nausea and vomiting.  Genitourinary: Negative for difficulty urinating and dysuria.  Musculoskeletal: Negative for joint swelling.       Stinging and shock sensation - various locations - body.   Skin: Negative for color change and rash.  Neurological: Negative for dizziness and headaches.  Psychiatric/Behavioral: Negative for agitation and dysphoric mood.        Increased stress related to ongoing symptoms.        Objective:    Physical Exam Vitals reviewed.  Constitutional:      General: She is not in acute distress.    Appearance: Normal appearance.  HENT:     Head: Normocephalic and atraumatic.     Right Ear: External ear normal.     Left Ear: External ear normal.     Mouth/Throat:     Mouth: Oropharynx is clear and moist.  Eyes:     General: No scleral icterus.       Right eye: No discharge.        Left eye: No discharge.     Conjunctiva/sclera: Conjunctivae normal.  Neck:     Thyroid: No thyromegaly.  Cardiovascular:     Rate and Rhythm: Normal rate and regular rhythm.  Pulmonary:     Effort: No respiratory distress.     Breath sounds: Normal breath sounds. No wheezing.  Abdominal:     General: Bowel sounds are normal.     Palpations: Abdomen is soft.     Tenderness: There is no abdominal tenderness.  Musculoskeletal:        General: No swelling, tenderness or edema.     Cervical back: Neck supple. No tenderness.  Lymphadenopathy:     Cervical: No cervical adenopathy.  Skin:    Findings: No erythema or rash.  Neurological:     Mental Status: She is alert.  Psychiatric:        Mood and Affect: Mood normal.        Behavior: Behavior normal.     BP 120/72   Pulse 84   Temp (!) 97.4 F (36.3 C) (Oral)   Resp 16   Ht 5\' 5"  (1.651 m)   Wt 165 lb (74.8 kg)   SpO2 99%   BMI 27.46 kg/m  Wt Readings from Last 3 Encounters:  11/12/20 165 lb (74.8 kg)  10/12/20 161 lb (73 kg)  10/04/20 163 lb 9.6 oz (74.2 kg)     Lab Results  Component Value Date   WBC 11.3 (H) 09/13/2020   HGB 13.7 09/13/2020   HCT 42.4 09/13/2020   PLT 321 09/13/2020   GLUCOSE 90 09/13/2020   ALT 16 01/29/2020   AST 18 01/29/2020   NA 138 09/13/2020   K 3.8 09/13/2020   CL 108 09/13/2020   CREATININE 0.75 09/13/2020   BUN 10 09/13/2020   CO2 19 (L) 09/13/2020   TSH 1.51 01/29/2020    CT OUTSIDE FILMS CHEST  Result Date:  09/27/2020 This examination belongs to an outside facility and is stored here for comparison purposes only.  Contact the originating outside institution for any associated report or interpretation.      Assessment & Plan:   Problem List Items Addressed This Visit    Anxiety    Seeing Dr 09/29/2020.  On medication now.  Some increased anxiety related to persistent symptoms.  Overall appears to be doing better.  Follow.        Weakness    Describes weakness as outlined.  Has seen neurology, rheumatology and cardiology.  Recent reevaluation by pulmonary.  Exercise stress test as outlined.  Increased water intake improved symptoms.  Discussed compression hose.  Follow.        SOB (shortness of breath)    Recent stress test as outlined.  Breathing overall stable.  Keep f/u with pulmonary.        Fatigue    Again felt better with increased water intake.  Compression hose.  Blood pressure looks good.  Follow.        Asthma, mild persistent    Diagnosed with mild asthma. Intolerant to trelegy and breo.  Breathing overall appears to be stable.  Follow.       Throat irritation    Throat irritation as outlined.  Take pepcid daily.  Follow.           Einar Pheasant, MD

## 2020-11-14 ENCOUNTER — Encounter: Payer: Self-pay | Admitting: Internal Medicine

## 2020-11-14 DIAGNOSIS — J392 Other diseases of pharynx: Secondary | ICD-10-CM | POA: Insufficient documentation

## 2020-11-14 NOTE — Assessment & Plan Note (Signed)
Again felt better with increased water intake.  Compression hose.  Blood pressure looks good.  Follow.

## 2020-11-14 NOTE — Assessment & Plan Note (Signed)
Diagnosed with mild asthma. Intolerant to trelegy and breo.  Breathing overall appears to be stable.  Follow.

## 2020-11-14 NOTE — Assessment & Plan Note (Signed)
Throat irritation as outlined.  Take pepcid daily.  Follow.

## 2020-11-14 NOTE — Assessment & Plan Note (Signed)
Describes weakness as outlined.  Has seen neurology, rheumatology and cardiology.  Recent reevaluation by pulmonary.  Exercise stress test as outlined.  Increased water intake improved symptoms.  Discussed compression hose.  Follow.

## 2020-11-14 NOTE — Assessment & Plan Note (Signed)
Recent stress test as outlined.  Breathing overall stable.  Keep f/u with pulmonary.

## 2020-11-14 NOTE — Assessment & Plan Note (Signed)
Seeing Dr Maryruth Bun.  On medication now.  Some increased anxiety related to persistent symptoms.  Overall appears to be doing better.  Follow.

## 2020-11-29 ENCOUNTER — Ambulatory Visit: Payer: Self-pay | Admitting: Internal Medicine

## 2020-12-01 ENCOUNTER — Ambulatory Visit: Payer: Self-pay | Admitting: Internal Medicine

## 2020-12-21 ENCOUNTER — Ambulatory Visit: Payer: Self-pay | Admitting: Internal Medicine

## 2020-12-21 ENCOUNTER — Other Ambulatory Visit: Payer: Self-pay

## 2020-12-21 DIAGNOSIS — F419 Anxiety disorder, unspecified: Secondary | ICD-10-CM

## 2020-12-21 DIAGNOSIS — R531 Weakness: Secondary | ICD-10-CM

## 2020-12-21 DIAGNOSIS — R5383 Other fatigue: Secondary | ICD-10-CM

## 2020-12-21 DIAGNOSIS — R253 Fasciculation: Secondary | ICD-10-CM

## 2020-12-21 DIAGNOSIS — R0602 Shortness of breath: Secondary | ICD-10-CM

## 2020-12-21 NOTE — Progress Notes (Signed)
Subjective:    Patient ID: Tonya Harris, female    DOB: 02-Jan-1981, 40 y.o.   MRN: [id number]  HPI This visit occurred during the SARS-CoV-2 public health emergency.  Safety protocols were in place, including screening questions prior to the visit, additional usage of staff PPE, and extensive cleaning of exam room while observing appropriate contact time as indicated for disinfecting solutions.  Patient here for a scheduled follow up. Wanted to discuss some ongoing concerns.  Has noticed more stiffness recently.  Noticed after sitting - joint stiffness.  Noticed some finger swelling - when wakes up.  Stiffness worse in am.  Hard to walk.  Arms tired.  Neck tired.  Breathing overall appears to be stable.  Eating.  No vomiting.  No fever and no unusual rash.  Still limitations regarding her activity and exercise.  She is trying to make herself exercise.  Recent exercise stress test 11/02/20 - normal functional capacity.  Has started trying to drink more water.  Wearing compression hose.  May have helped symptoms some.  Seeing Dr Maryruth Bun.  Taking medication.  Family involved.  Some frustration with not knowing the etiology of her symptoms.    Past Medical History:  Diagnosis Date  . Gestational diabetes    Past Surgical History:  Procedure Laterality Date  . APPENDECTOMY     Family History  Problem Relation Age of Onset  . Hypertension Mother   . Hypertension Father    Social History   Socioeconomic History  . Marital status: Married    Spouse name: Not on file  . Number of children: 4  . Years of education: Not on file  . Highest education level: Master's degree (e.g., MA, MS, MEng, MEd, MSW, MBA)  Occupational History  . 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 Smoking status: Never Smoker  . Smokeless tobacco: Never Used  Vaping Use  . Vaping Use: Never used  Substance and Sexual Activity  . Alcohol use: Not Currently  . Drug use: Not Currently   . Sexual activity: Yes    Partners: Male  Other Topics Concern  . 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 fluticasone furoate-vilanterol (BREO ELLIPTA) 200-25 MCG/INH AEPB Inhale 1 puff into the lungs daily.  Marland Kitchen levalbuterol (XOPENEX HFA) 45 MCG/ACT inhaler Inhale 2 puffs into the lungs every 8 (eight) hours as needed for wheezing or shortness of breath.  . Multiple Vitamin (MULTIVITAMIN) tablet Take 1 tablet by mouth daily.  Marland Kitchen omeprazole (PRILOSEC) 40 MG capsule TAKE 1 CAPSULE (40 MG TOTAL) BY MOUTH EVERY EVENING.   No facility-administered encounter medications on file as of 12/21/2020.    Review of Systems  Constitutional: Positive for fatigue. Negative for appetite change.  HENT: Negative for congestion and sinus pressure.   Respiratory: Negative for cough and chest tightness.        Breathing stable.   Cardiovascular: Negative for chest pain, palpitations and leg swelling.  Gastrointestinal: Negative for abdominal pain,  diarrhea, nausea and vomiting.  Genitourinary: Negative for difficulty urinating and dysuria.  Musculoskeletal: Negative for gait problem.       Joint stiffness as outlined.    Skin: Negative for color change and rash.  Neurological: Negative for dizziness and headaches.  Psychiatric/Behavioral: Negative for agitation and dysphoric mood.       Objective:    Physical Exam Vitals reviewed.  Constitutional:      General: She is not in acute distress.    Appearance: Normal appearance.  HENT:     Head: Normocephalic and atraumatic.     Right Ear: External ear  normal.     Left Ear: External ear normal.     Mouth/Throat:     Mouth: Oropharynx is clear and moist.  Eyes:     General: No scleral icterus.       Right eye: No discharge.        Left eye: No discharge.     Conjunctiva/sclera: Conjunctivae normal.  Neck:     Thyroid: No thyromegaly.  Cardiovascular:     Rate and Rhythm: Normal rate and regular rhythm.  Pulmonary:     Effort: No respiratory distress.     Breath sounds: Normal breath sounds. No wheezing.  Abdominal:     General: Bowel sounds are normal.     Palpations: Abdomen is soft.     Tenderness: There is no abdominal tenderness.  Musculoskeletal:        General: No swelling, tenderness or edema.     Cervical back: Neck supple. No tenderness.  Lymphadenopathy:     Cervical: No cervical adenopathy.  Skin:    Findings: No erythema or rash.  Neurological:     Mental Status: She is alert.  Psychiatric:        Mood and Affect: Mood normal.        Behavior: Behavior normal.     BP 110/68   Pulse 84   Temp 97.7 F (36.5 C) (Oral)   Resp 16   Ht 5\' 5"  (1.651 m)   Wt 166 lb 6.4 oz (75.5 kg)   SpO2 99%   BMI 27.69 kg/m  Wt Readings from Last 3 Encounters:  12/21/20 166 lb 6.4 oz (75.5 kg)  11/12/20 165 lb (74.8 kg)  10/12/20 161 lb (73 kg)     Lab Results  Component Value Date   WBC 11.3 (H) 09/13/2020   HGB 13.7 09/13/2020   HCT 42.4 09/13/2020   PLT 321 09/13/2020   GLUCOSE 90 09/13/2020   ALT 16 01/29/2020   AST 18 01/29/2020   NA 138 09/13/2020   K 3.8 09/13/2020   CL 108 09/13/2020   CREATININE 0.75 09/13/2020   BUN 10 09/13/2020   CO2 19 (L) 09/13/2020   TSH 1.51 01/29/2020    CT OUTSIDE FILMS CHEST  Result Date: 09/27/2020 This examination belongs to an outside facility and is stored here for comparison purposes only.  Contact the originating outside institution for any associated report or interpretation.      Assessment & Plan:   Problem List Items Addressed This Visit    Anxiety     Seeing Dr 09/29/2020.  This appears to be going well.  Follow.  Discussed with her today regarding how anxiety about the symptoms can worsen her underlying feeling, etc.  She agrees.  Hold on further testing.  Follow.       Fatigue    Possibly felt some better with compression hose and staying hydrated.  Has had extensive w/up as outlined above.  Discussed holding on further testing and monitoring symptoms.  Continue exercise as tolerated.        Muscle twitching    Has been diagnosed with benign fasciculation syndrome.  Has seen neurology.  Have discussed f/u.  Follow symptoms.       SOB (shortness of breath)    Breathing overall stable.  W/up as outlined.  Hold on further testing right now.  Follow.       Weakness    Has seen neurology, rheumatology and cardiology.  Also recently had pulmonary evaluation.  Now with some joint stiffness.  Discussed.  Hold on further testing.  Continue stretches and exercise as tolerated.  Continue staying hydrated and compression hose.  Follow.  She is agreeable.           Dale [place], MD

## 2020-12-24 ENCOUNTER — Ambulatory Visit: Payer: Self-pay | Admitting: Internal Medicine

## 2020-12-26 ENCOUNTER — Encounter: Payer: Self-pay | Admitting: Internal Medicine

## 2020-12-26 NOTE — Assessment & Plan Note (Signed)
Has been diagnosed with benign fasciculation syndrome.  Has seen neurology.  Have discussed f/u.  Follow symptoms.

## 2020-12-26 NOTE — Assessment & Plan Note (Addendum)
Seeing Dr Maryruth Bun.  This appears to be going well.  Follow.  Discussed with her today regarding how anxiety about the symptoms can worsen her underlying feeling, etc.  She agrees.  Hold on further testing.  Follow.

## 2020-12-26 NOTE — Assessment & Plan Note (Signed)
Possibly felt some better with compression hose and staying hydrated.  Has had extensive w/up as outlined above.  Discussed holding on further testing and monitoring symptoms.  Continue exercise as tolerated.

## 2020-12-26 NOTE — Assessment & Plan Note (Signed)
Has seen neurology, rheumatology and cardiology.  Also recently had pulmonary evaluation.  Now with some joint stiffness.  Discussed.  Hold on further testing.  Continue stretches and exercise as tolerated.  Continue staying hydrated and compression hose.  Follow.  She is agreeable.

## 2020-12-26 NOTE — Assessment & Plan Note (Signed)
Breathing overall stable.  W/up as outlined.  Hold on further testing right now.  Follow.

## 2021-02-02 ENCOUNTER — Other Ambulatory Visit: Payer: Self-pay

## 2021-02-02 ENCOUNTER — Ambulatory Visit (INDEPENDENT_AMBULATORY_CARE_PROVIDER_SITE_OTHER): Payer: Self-pay | Admitting: Internal Medicine

## 2021-02-02 DIAGNOSIS — R059 Cough, unspecified: Secondary | ICD-10-CM

## 2021-02-02 DIAGNOSIS — F419 Anxiety disorder, unspecified: Secondary | ICD-10-CM

## 2021-02-02 DIAGNOSIS — J392 Other diseases of pharynx: Secondary | ICD-10-CM

## 2021-02-02 DIAGNOSIS — M2560 Stiffness of unspecified joint, not elsewhere classified: Secondary | ICD-10-CM

## 2021-02-02 DIAGNOSIS — R0602 Shortness of breath: Secondary | ICD-10-CM

## 2021-02-02 DIAGNOSIS — R253 Fasciculation: Secondary | ICD-10-CM

## 2021-02-02 MED ORDER — PANTOPRAZOLE SODIUM 40 MG PO TBEC: 40.0000 mg | DELAYED_RELEASE_TABLET | Freq: Every day | ORAL | 1 refills | Status: DC

## 2021-02-02 NOTE — Progress Notes (Signed)
Patient ID: Tonya Harris, female   DOB: 12-05-1980, 40 y.o.   MRN: [id number]   Subjective:    Patient ID: Tonya Harris, female    DOB: 1981-02-07, 40 y.o.   MRN: [id number]  HPI This visit occurred during the SARS-CoV-2 public health emergency.  Safety protocols were in place, including screening questions prior to the visit, additional usage of staff PPE, and extensive cleaning of exam room while observing appropriate contact time as indicated for disinfecting solutions.  Patient here for a scheduled follow up.  Noticing some irritation when eating.  Some hoarseness at endo fo day.  Some chronic cough - worse after eating.  Some burping.  Breathing stable.  Taking omeprazole q hs. Is concerned regarding increased stiffness.  Notices after sitting.  Worse in am.  Eating.  Has started exercising - walking more.  Seeing Dr Maryruth Bun. Taking zoloft.  Prescribed risperdol.   Past Medical History:  Diagnosis Date  . Gestational diabetes    Past Surgical History:  Procedure Laterality Date  . APPENDECTOMY     Family History  Problem Relation Age of Onset  . Hypertension Mother   . Hypertension Father    Social History   Socioeconomic History  . Marital status: Married    Spouse name: Not on file  . Number of children: 4  . Years of education: Not on file  . Highest education level: Master's degree (e.g., MA, MS, MEng, MEd, MSW, MBA)  Occupational History  . 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 Smoking status: Never Smoker  . Smokeless tobacco: Never Used  Vaping Use  . Vaping Use: Never used  Substance and Sexual Activity  . Alcohol use: Not Currently  . Drug use: Not Currently  . Sexual activity: Yes    Partners: Male  Other Topics Concern  . 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 pantoprazole (PROTONIX) 40 MG tablet Take 1 tablet (40 mg total) by mouth daily.  . sertraline (ZOLOFT) 100 MG tablet Take 100 mg by mouth daily.  . Multiple Vitamin (MULTIVITAMIN) tablet Take 1 tablet by mouth daily.  . risperiDONE (RISPERDAL) 0.5 MG tablet Take 0.5 mg by mouth at bedtime. (Patient not taking: Reported on 02/02/2021)  . [DISCONTINUED] fluticasone furoate-vilanterol (BREO ELLIPTA) 200-25 MCG/INH AEPB Inhale 1 puff into the lungs daily.  . [DISCONTINUED] levalbuterol (XOPENEX HFA) 45 MCG/ACT inhaler Inhale 2 puffs into the lungs every 8 (eight) hours as needed for wheezing or shortness of breath.  . [DISCONTINUED] omeprazole (PRILOSEC) 40 MG capsule TAKE 1 CAPSULE (40 MG TOTAL) BY MOUTH EVERY EVENING.   No facility-administered encounter medications on file as of 02/02/2021.    Review of Systems  Constitutional: Negative for appetite change and unexpected weight change.  HENT: Negative for congestion and sinus pressure.   Respiratory: Negative for cough and chest tightness.        Breathing stable.   Cardiovascular: Negative for palpitations and leg swelling.  Gastrointestinal: Negative  for abdominal pain, diarrhea, nausea and vomiting.  Genitourinary: Negative for difficulty urinating and dysuria.  Musculoskeletal:       Joint stiffness as outlined.  Worse in am.   Skin: Negative for color change and rash.  Neurological: Negative for dizziness, light-headedness and headaches.  Psychiatric/Behavioral: Negative for agitation and dysphoric mood.       Objective:    Physical Exam Vitals reviewed.  Constitutional:      General: She is not in acute distress.    Appearance: Normal appearance.  HENT:     Head: Normocephalic and  atraumatic.     Right Ear: External ear normal.     Left Ear: External ear normal.  Eyes:     General: No scleral icterus.       Right eye: No discharge.        Left eye: No discharge.     Conjunctiva/sclera: Conjunctivae normal.  Neck:     Thyroid: No thyromegaly.  Cardiovascular:     Rate and Rhythm: Normal rate and regular rhythm.  Pulmonary:     Effort: No respiratory distress.     Breath sounds: Normal breath sounds. No wheezing.  Abdominal:     General: Bowel sounds are normal.     Palpations: Abdomen is soft.     Tenderness: There is no abdominal tenderness.  Musculoskeletal:        General: No swelling or tenderness.     Cervical back: Neck supple. No tenderness.  Lymphadenopathy:     Cervical: No cervical adenopathy.  Skin:    Findings: No erythema or rash.  Neurological:     Mental Status: She is alert.  Psychiatric:        Mood and Affect: Mood normal.        Behavior: Behavior normal.     BP 120/70   Pulse 70   Temp (!) 97.4 F (36.3 C) (Oral)   Resp 16   Ht 5\' 5"  (1.651 m)   Wt 162 lb (73.5 kg)   SpO2 99%   BMI 26.96 kg/m  Wt Readings from Last 3 Encounters:  02/02/21 162 lb (73.5 kg)  12/21/20 166 lb 6.4 oz (75.5 kg)  11/12/20 165 lb (74.8 kg)     Lab Results  Component Value Date   WBC 11.3 (H) 09/13/2020   HGB 13.7 09/13/2020   HCT 42.4 09/13/2020   PLT 321 09/13/2020   GLUCOSE 90 09/13/2020   ALT 16 01/29/2020   AST 18 01/29/2020   NA 138 09/13/2020   K 3.8 09/13/2020   CL 108 09/13/2020   CREATININE 0.75 09/13/2020   BUN 10 09/13/2020   CO2 19 (L) 09/13/2020   TSH 1.51 01/29/2020    CT OUTSIDE FILMS CHEST  Result Date: 09/27/2020 This examination belongs to an outside facility and is stored here for comparison purposes only.  Contact the originating outside institution for any associated report or interpretation.      Assessment & Plan:   Problem List Items Addressed This Visit    Anxiety    Seeing Dr 09/29/2020. On zoloft.   Has risperdol.  Is walking more.  Overall appears to be stable.  Follow.       Relevant Medications   sertraline (ZOLOFT) 100 MG tablet   Cough    Seeing pulmonary.  Discussed prilosec to treat acid reflux.  Follow.       Joint stiffness    Increased joint stiffness as outlined.  Worse in am.  Desires  reevaluation by rheumatology.  Follow.  Continue stretches and exercise.       Relevant Orders   Ambulatory referral to Rheumatology   Muscle twitching    Has been diagnosed with benign fasciculation syndrome.  Has seen neurology.  Stable.       SOB (shortness of breath)    Breathing overall stable.  Seeing pulmonary.        Throat irritation    Throat irritation as outlined. Some hoarseness as outlined.  Burping.  Omeprazole - can take bid.  Follow.            Dale [place], MD

## 2021-02-07 ENCOUNTER — Encounter: Payer: Self-pay | Admitting: Internal Medicine

## 2021-02-07 DIAGNOSIS — M2560 Stiffness of unspecified joint, not elsewhere classified: Secondary | ICD-10-CM | POA: Insufficient documentation

## 2021-02-07 NOTE — Assessment & Plan Note (Signed)
Throat irritation as outlined. Some hoarseness as outlined.  Burping.  Omeprazole - can take bid.  Follow.

## 2021-02-07 NOTE — Assessment & Plan Note (Signed)
Increased joint stiffness as outlined.  Worse in am.  Desires reevaluation by rheumatology.  Follow.  Continue stretches and exercise.

## 2021-02-07 NOTE — Assessment & Plan Note (Signed)
Has been diagnosed with benign fasciculation syndrome.  Has seen neurology.  Stable.

## 2021-02-07 NOTE — Assessment & Plan Note (Signed)
Breathing overall stable.  Seeing pulmonary.

## 2021-02-07 NOTE — Assessment & Plan Note (Signed)
Seeing Dr Maryruth Bun. On zoloft.  Has risperdol.  Is walking more.  Overall appears to be stable.  Follow.

## 2021-02-07 NOTE — Assessment & Plan Note (Signed)
Seeing pulmonary.  Discussed prilosec to treat acid reflux.  Follow.

## 2021-02-16 ENCOUNTER — Ambulatory Visit: Payer: Self-pay | Admitting: Dermatology

## 2021-02-25 ENCOUNTER — Other Ambulatory Visit: Payer: Self-pay | Admitting: Internal Medicine

## 2021-03-08 ENCOUNTER — Telehealth: Payer: Self-pay | Admitting: Internal Medicine

## 2021-03-08 NOTE — Telephone Encounter (Signed)
lft vm for pt to call ofc to follow up on referral to [facility] rheumatology. thanks

## 2021-03-11 ENCOUNTER — Other Ambulatory Visit: Payer: Self-pay | Admitting: Internal Medicine

## 2021-03-16 ENCOUNTER — Other Ambulatory Visit: Payer: Self-pay

## 2021-03-16 ENCOUNTER — Ambulatory Visit (INDEPENDENT_AMBULATORY_CARE_PROVIDER_SITE_OTHER): Payer: Self-pay | Admitting: Internal Medicine

## 2021-03-16 VITALS — BP 118/70 | HR 89 | Temp 96.6°F | Resp 16 | Ht 65.0 in | Wt 159.0 lb

## 2021-03-16 DIAGNOSIS — J4530 Mild persistent asthma, uncomplicated: Secondary | ICD-10-CM

## 2021-03-16 DIAGNOSIS — R531 Weakness: Secondary | ICD-10-CM

## 2021-03-16 DIAGNOSIS — R253 Fasciculation: Secondary | ICD-10-CM

## 2021-03-16 DIAGNOSIS — R002 Palpitations: Secondary | ICD-10-CM

## 2021-03-16 DIAGNOSIS — M791 Myalgia, unspecified site: Secondary | ICD-10-CM

## 2021-03-16 DIAGNOSIS — M2560 Stiffness of unspecified joint, not elsewhere classified: Secondary | ICD-10-CM

## 2021-03-16 DIAGNOSIS — F419 Anxiety disorder, unspecified: Secondary | ICD-10-CM

## 2021-03-16 DIAGNOSIS — R5383 Other fatigue: Secondary | ICD-10-CM

## 2021-03-16 DIAGNOSIS — R0602 Shortness of breath: Secondary | ICD-10-CM

## 2021-03-16 DIAGNOSIS — I8393 Asymptomatic varicose veins of bilateral lower extremities: Secondary | ICD-10-CM

## 2021-03-16 LAB — COMPREHENSIVE METABOLIC PANEL
ALT: 16 U/L (ref 0–35)
AST: 19 U/L (ref 0–37)
Albumin: 4.6 g/dL (ref 3.5–5.2)
Alkaline Phosphatase: 39 U/L (ref 39–117)
BUN: 13 mg/dL (ref 6–23)
CO2: 24 mEq/L (ref 19–32)
Calcium: 9.4 mg/dL (ref 8.4–10.5)
Chloride: 103 mEq/L (ref 96–112)
Creatinine, Ser: 0.75 mg/dL (ref 0.40–1.20)
GFR: 101.37 mL/min (ref >60.00)
Glucose, Bld: 90 mg/dL (ref 70–99)
Potassium: 4.2 mEq/L (ref 3.5–5.1)
Sodium: 136 mEq/L (ref 135–145)
Total Bilirubin: 0.8 mg/dL (ref 0.2–1.2)
Total Protein: 7 g/dL (ref 6.0–8.3)

## 2021-03-16 LAB — CBC WITH DIFFERENTIAL/PLATELET
Basophils Absolute: 0 10*3/uL (ref 0.0–0.1)
Basophils Relative: 0.6 % (ref 0.0–3.0)
Eosinophils Absolute: 0 10*3/uL (ref 0.0–0.7)
Eosinophils Relative: 0.4 % (ref 0.0–5.0)
HCT: 38.5 % (ref 36.0–46.0)
Hemoglobin: 13.1 g/dL (ref 12.0–15.0)
Lymphocytes Relative: 23.7 % (ref 12.0–46.0)
Lymphs Abs: 1.4 10*3/uL (ref 0.7–4.0)
MCHC: 34 g/dL (ref 30.0–36.0)
MCV: 85 fl (ref 78.0–100.0)
Monocytes Absolute: 0.3 10*3/uL (ref 0.1–1.0)
Monocytes Relative: 4.9 % (ref 3.0–12.0)
Neutro Abs: 4.1 10*3/uL (ref 1.4–7.7)
Neutrophils Relative %: 70.4 % (ref 43.0–77.0)
Platelets: 285 10*3/uL (ref 150.0–400.0)
RBC: 4.53 Mil/uL (ref 3.87–5.11)
RDW: 14.1 % (ref 11.5–15.5)
WBC: 5.9 10*3/uL (ref 4.0–10.5)

## 2021-03-16 LAB — C-REACTIVE PROTEIN: CRP: <1 mg/dL (ref 0.5–20.0)

## 2021-03-16 LAB — SEDIMENTATION RATE: Sed Rate: 12 mm/hr (ref 0–20)

## 2021-03-16 LAB — TSH: TSH: 1.3 u[IU]/mL (ref 0.35–4.50)

## 2021-03-16 NOTE — Progress Notes (Signed)
Patient ID: Tonya Harris, female   DOB: [birth year], 40 y.o.   MRN: [id number]   Subjective:    Patient ID: Tonya Harris, female    DOB: 1981/07/23, 40 y.o.   MRN: [id number]  HPI This visit occurred during the SARS-CoV-2 public health emergency.  Safety protocols were in place, including screening questions prior to the visit, additional usage of staff PPE, and extensive cleaning of exam room while observing appropriate contact time as indicated for disinfecting solutions.  Patient here for a scheduled follow up. Here to follow up regarding weakness, joint stiffness, anxiety and fatigue.  Persistent joint stiffness.  Feels has worsened.  Worse when has been still for a period of time.  Takes approximately 5 minutes to start walking more normal.  She is trying to exercise.  Describes arm weakness. Notices when doing her hair, hanging clothes, etc. Hands feel puffy.    Still with some sob with stairs.  Still with persistent muscle twitches.  Eating.  No nausea or vomiting.  Some increased stress related to above.  Misses her friends in [place].  Seeing psychiatry.  Request referral to vascular for evaluation of varicose veins.   Past Medical History:  Diagnosis Date  . Gestational diabetes    Past Surgical History:  Procedure Laterality Date  . APPENDECTOMY     Family History  Problem Relation Age of Onset  . Hypertension Mother   . Hypertension Father    Social History   Socioeconomic History  . Marital status: Married    Spouse name: Not on file  . Number of children: 4  . Years of education: Not on file  . Highest education level: Master's degree (e.g., MA, MS, MEng, MEd, MSW, MBA)  Occupational History  . Occupation: Pharmacist, hospital  . Occupation: Stay at home mom  Tobacco Use  . Smoking status: Never Smoker  . Smokeless tobacco: Never Used  Vaping Use  . Vaping Use: Never used  Substance and Sexual Activity  . Alcohol use: Not Currently  . Drug use: Not Currently  .  Sexual activity: Yes    Partners: Male  Other Topics Concern  . 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 Multiple Vitamin (MULTIVITAMIN) tablet Take 1 tablet by mouth daily.  . pantoprazole (PROTONIX) 40 MG tablet TAKE 1 TABLET BY MOUTH EVERY DAY  . sertraline (ZOLOFT) 100 MG tablet Take 100 mg by mouth daily.  . [DISCONTINUED] risperiDONE (RISPERDAL) 0.5 MG tablet Take 0.5 mg by mouth at bedtime. (Patient not taking: No sig reported)   No facility-administered encounter medications on file as of 03/16/2021.    Review of Systems  Constitutional: Positive for fatigue. Negative for appetite change and unexpected weight change.  HENT: Negative for congestion and sinus pressure.   Respiratory: Negative for cough.        Still some sob with increased exertion.  Appears to be stable overall.   Cardiovascular: Negative for palpitations and leg swelling.  Gastrointestinal: Negative for abdominal pain, diarrhea, nausea and vomiting.  Genitourinary: Negative for difficulty urinating and dysuria.  Musculoskeletal:       Joint swelling and stiffness.    Skin: Negative for color change and rash.  Neurological: Negative for dizziness and headaches.  Psychiatric/Behavioral: Negative for dysphoric mood.       Does miss her friends.         Objective:    Physical Exam Vitals reviewed.  Constitutional:      General: She is not in acute distress.    Appearance: Normal appearance.  HENT:     Head: Normocephalic and atraumatic.     Right Ear: External ear normal.     Left Ear: External ear normal.   Eyes:     General: No scleral icterus.       Right eye: No discharge.        Left eye: No discharge.     Conjunctiva/sclera: Conjunctivae normal.  Neck:     Thyroid: No thyromegaly.  Cardiovascular:     Rate and Rhythm: Normal rate and regular rhythm.  Pulmonary:     Effort: No respiratory distress.     Breath sounds: Normal breath sounds. No wheezing.  Abdominal:     General: Bowel sounds are normal.     Palpations: Abdomen is soft.     Tenderness: There is no abdominal tenderness.  Musculoskeletal:        General: No swelling or tenderness.     Cervical back: Neck supple. No tenderness.  Lymphadenopathy:     Cervical: No cervical adenopathy.  Skin:    Findings: No erythema or rash.  Neurological:     Mental Status: She is alert.  Psychiatric:        Mood and Affect: Mood normal.        Behavior: Behavior normal.     BP 118/70   Pulse 89   Temp (!) 96.6 F (35.9 C) (Oral)   Resp 16   Ht '5\' 5"'  (1.651 m)   Wt 159 lb (72.1 kg)   SpO2 99%   BMI 26.46 kg/m  Wt Readings from Last 3 Encounters:  03/16/21 159 lb (72.1 kg)  02/02/21 162 lb (73.5 kg)  12/21/20 166 lb 6.4 oz (75.5 kg)     Lab Results  Component Value Date   WBC 5.9 03/16/2021   HGB 13.1 03/16/2021   HCT 38.5 03/16/2021   PLT 285.0 03/16/2021   GLUCOSE 90 03/16/2021   ALT 16 03/16/2021   AST 19 03/16/2021   NA 136 03/16/2021   K 4.2 03/16/2021   CL 103 03/16/2021   CREATININE 0.75 03/16/2021   BUN 13 03/16/2021   CO2 24 03/16/2021   TSH 1.30 03/16/2021    CT OUTSIDE FILMS CHEST  Result Date: 09/27/2020 This examination belongs to an outside facility and is stored here for comparison purposes only.  Contact the originating outside institution for any associated report or interpretation.      Assessment & Plan:   Problem List Items Addressed This Visit    Anxiety    On zoloft.  Appears stable.  Follow.        Asthma, mild persistent    Diagnosed with mild asthma.  Intolerant to  trelegy and breo.  Breathing overall stable.  Has seen pulmonary and w/up as outlined previously.  Follow.       Fatigue   Relevant Orders   CBC with Differential/Platelet (Completed)   Comprehensive metabolic panel (Completed)   TSH (Completed)   Joint stiffness - Primary  Persistent joint stiffness as outlined.  Takes a while to "get going" in the am.  Refer back to rheumatology for reevaluation.  Continue stretches and exercise.       Relevant Orders   Sedimentation rate (Completed)   C-reactive protein (Completed)   Muscle ache    Increased aching and joint stiffness.  Check esr and crp.  Referral has been placed for rheumatology for reevaluation.        Muscle twitching    Has been diagnosed with benign fasciculation syndrome.  Has seen neurology.  Stable.       Palpitations    Sinus tachycardia noted on holter.  No significant arrhythmia.  Overall appears to be stable.       SOB (shortness of breath)    Has seen pulmonary.  Overall appears to be stable.  Follow.       Varicose veins of both lower extremities    Request referral to vascular surgery.       Relevant Orders   Ambulatory referral to Vascular Surgery   Weakness    Has seen neurology, rheumatology and cardiology.  Also pulmonary evaluation.  With increased joint stiffness as outlined.  Have referred to rheumatology for reevaluation.            Einar Pheasant, MD

## 2021-03-26 ENCOUNTER — Encounter: Payer: Self-pay | Admitting: Internal Medicine

## 2021-03-26 DIAGNOSIS — I8393 Asymptomatic varicose veins of bilateral lower extremities: Secondary | ICD-10-CM | POA: Insufficient documentation

## 2021-03-26 NOTE — Assessment & Plan Note (Signed)
Diagnosed with mild asthma.  Intolerant to trelegy and breo.  Breathing overall stable.  Has seen pulmonary and w/up as outlined previously.  Follow.

## 2021-03-26 NOTE — Assessment & Plan Note (Signed)
Persistent joint stiffness as outlined.  Takes a while to "get going" in the am.  Refer back to rheumatology for reevaluation.  Continue stretches and exercise.

## 2021-03-26 NOTE — Assessment & Plan Note (Signed)
Has seen pulmonary.  Overall appears to be stable.  Follow.

## 2021-03-26 NOTE — Assessment & Plan Note (Signed)
Has seen neurology, rheumatology and cardiology.  Also pulmonary evaluation.  With increased joint stiffness as outlined.  Have referred to rheumatology for reevaluation.

## 2021-03-26 NOTE — Assessment & Plan Note (Signed)
Has been diagnosed with benign fasciculation syndrome.  Has seen neurology.  Stable.

## 2021-03-26 NOTE — Assessment & Plan Note (Signed)
Increased aching and joint stiffness.  Check esr and crp.  Referral has been placed for rheumatology for reevaluation.

## 2021-03-26 NOTE — Assessment & Plan Note (Signed)
Request referral to vascular surgery.

## 2021-03-26 NOTE — Assessment & Plan Note (Signed)
On zoloft.  Appears stable.  Follow.

## 2021-03-26 NOTE — Assessment & Plan Note (Signed)
Sinus tachycardia noted on holter.  No significant arrhythmia.  Overall appears to be stable.

## 2021-03-28 ENCOUNTER — Ambulatory Visit: Payer: Self-pay | Admitting: Internal Medicine

## 2021-03-30 ENCOUNTER — Ambulatory Visit: Payer: Self-pay | Admitting: Internal Medicine

## 2021-05-04 ENCOUNTER — Encounter: Payer: Self-pay | Admitting: Internal Medicine

## 2021-05-04 ENCOUNTER — Ambulatory Visit (INDEPENDENT_AMBULATORY_CARE_PROVIDER_SITE_OTHER): Payer: Self-pay | Admitting: Internal Medicine

## 2021-05-04 ENCOUNTER — Other Ambulatory Visit: Payer: Self-pay

## 2021-05-04 DIAGNOSIS — F419 Anxiety disorder, unspecified: Secondary | ICD-10-CM

## 2021-05-04 DIAGNOSIS — M791 Myalgia, unspecified site: Secondary | ICD-10-CM

## 2021-05-04 DIAGNOSIS — J4530 Mild persistent asthma, uncomplicated: Secondary | ICD-10-CM

## 2021-05-04 DIAGNOSIS — R519 Headache, unspecified: Secondary | ICD-10-CM

## 2021-05-04 DIAGNOSIS — M2560 Stiffness of unspecified joint, not elsewhere classified: Secondary | ICD-10-CM

## 2021-05-04 DIAGNOSIS — R5383 Other fatigue: Secondary | ICD-10-CM

## 2021-05-04 DIAGNOSIS — R253 Fasciculation: Secondary | ICD-10-CM

## 2021-05-04 MED ORDER — MAGNESIUM OXIDE -MG SUPPLEMENT 400 (240 MG) MG PO TABS: 400.0000 mg | ORAL_TABLET | Freq: Every day | ORAL | 2 refills | Status: DC

## 2021-05-04 NOTE — Progress Notes (Signed)
Patient ID: Tonya Harris, female   DOB: Sep 02, 1981, 40 y.o.   MRN: [id number]   Subjective:    Patient ID: Tonya Harris, female    DOB: 04/29/1981, 40 y.o.   MRN: [id number]  HPI This visit occurred during the SARS-CoV-2 public health emergency.  Safety protocols were in place, including screening questions prior to the visit, additional usage of staff PPE, and extensive cleaning of exam room while observing appropriate contact time as indicated for disinfecting solutions.   Patient here for a scheduled follow up.  Here to follow up regarding joint stiffness, anxiety and fatigue.  She also reports having issues with headaches recently.  States starting more than one year ago, had her eyes checked because she noticed some occasional blurring and light sensitivity.  Vision was ok.  Describes having daily headaches.  Feels like something pushing down on her head.  Light hurts her eyes.  States when stands - heaviness shifts.  Leaning forward - heaviness in her sinus.  No dizziness.  Intermittent - will feel faint.  Breathing overall stable.  Still with some sob - worked up by cardiology/pulmonary.  Has been trying to exercise.  Still with increased stiffness.  Due to see rheumatology in a couple of weeks.    Past Medical History:  Diagnosis Date   Gestational diabetes    Past Surgical History:  Procedure Laterality Date   APPENDECTOMY     Family History  Problem Relation Age of Onset   Hypertension Mother    Hypertension Father    Social History   Socioeconomic History   Marital status: Married    Spouse name: Not on file   Number of children: 4   Years of education: Not on file   Highest education level: Master's degree (e.g., MA, MS, MEng, MEd, MSW, MBA)  Occupational History   Occupation: Runner, broadcasting/film/video   Occupation: Stay at home mom  Tobacco Use   Smoking status: Never   Smokeless tobacco: Never  Vaping Use   Vaping Use: Never used  Substance and Sexual Activity   Alcohol  use: Not Currently   Drug use: Not Currently   Sexual activity: Yes    Partners: Male  Other Topics Concern   Not on file  Social History Narrative   Right handed      Master's degree in teaching       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 Negative for appetite change and unexpected weight change.  HENT:  Negative for congestion and sinus pressure.   Respiratory:  Negative for cough and chest tightness.        Still with sob.    Cardiovascular:  Negative for palpitations and leg swelling.       Some intermittent stabbing pain - unchanged.   Gastrointestinal:  Negative for abdominal pain, diarrhea, nausea and vomiting.  Genitourinary:  Negative for difficulty urinating and dysuria.  Musculoskeletal:  Negative for joint swelling.       Joint stiffness.    Skin:  Negative for color change and rash.  Neurological:  Positive for headaches. Negative for dizziness.  Psychiatric/Behavioral:  Negative for suicidal ideas.  Increased stress related to current symptoms.        Objective:    Physical Exam Vitals reviewed.  Constitutional:      General: She is not in acute distress.    Appearance: Normal appearance.  HENT:     Head: Normocephalic and atraumatic.     Right Ear: External ear normal.     Left Ear: External ear normal.  Eyes:     General: No scleral icterus.       Right eye: No discharge.        Left eye: No discharge.     Extraocular Movements: Extraocular movements intact.     Conjunctiva/sclera: Conjunctivae normal.     Pupils: Pupils are equal, round, and reactive to light.     Comments: Fundi visualized appeared to be benign.   Neck:     Thyroid: No thyromegaly.   Cardiovascular:     Rate and Rhythm: Normal rate and regular rhythm.  Pulmonary:     Effort: No respiratory distress.     Breath sounds: Normal breath sounds. No wheezing.  Abdominal:     General: Bowel sounds are normal.     Palpations: Abdomen is soft.     Tenderness: There is no abdominal tenderness.  Musculoskeletal:        General: No swelling or tenderness.     Cervical back: Neck supple. No tenderness.  Lymphadenopathy:     Cervical: No cervical adenopathy.  Skin:    Findings: No erythema or rash.  Neurological:     Mental Status: She is alert.  Psychiatric:        Mood and Affect: Mood normal.        Behavior: Behavior normal.    BP 122/78   Pulse 78   Temp (!) 96.9 F (36.1 C)   Resp 16   Ht 5\' 5"  (1.651 m)   Wt 163 lb 9.6 oz (74.2 kg)   SpO2 99%   BMI 27.22 kg/m  Wt Readings from Last 3 Encounters:  05/04/21 163 lb 9.6 oz (74.2 kg)  03/16/21 159 lb (72.1 kg)  02/02/21 162 lb (73.5 kg)    Outpatient Encounter Medications as of 05/04/2021  Medication Sig   magnesium oxide (MAG-OX) 400 (240 Mg) MG tablet Take 1 tablet (400 mg total) by mouth daily.   Multiple Vitamin (MULTIVITAMIN) tablet Take 1 tablet by mouth daily.   pantoprazole (PROTONIX) 40 MG tablet TAKE 1 TABLET BY MOUTH EVERY DAY   sertraline (ZOLOFT) 100 MG tablet Take 100 mg by mouth daily.   No facility-administered encounter medications on file as of 05/04/2021.     Lab Results  Component Value Date   WBC 5.9 03/16/2021   HGB 13.1 03/16/2021   HCT 38.5 03/16/2021   PLT 285.0 03/16/2021   GLUCOSE 90 03/16/2021   ALT 16 03/16/2021   AST 19 03/16/2021   NA 136 03/16/2021   K 4.2 03/16/2021   CL 103 03/16/2021   CREATININE 0.75 03/16/2021   BUN 13 03/16/2021   CO2 24 03/16/2021   TSH 1.30 03/16/2021    CT OUTSIDE FILMS CHEST  Result Date: 09/27/2020 This examination belongs to an outside facility and is stored here for comparison purposes only.  Contact the originating outside  institution for any associated report or interpretation.      Assessment & Plan:   Problem List Items Addressed This Visit     Anxiety    Followed by psychiatry.  Remains on Zoloft.  Asthma, mild persistent    Intolerant to Trelegy..  Breathing overall stable.  Has seen pulmonary and work-up as previously outlined.  Still some shortness of breath.       Fatigue    Has had extensive work-up as outlined previously.  Continue compression hose and continue to stay hydrated.        Headache    Has noticed recently a daily headache.  Describes the headache as feeling like someone is pressing down on her head.  She has also noticed light sensitivity.  Heavy for a month.  Given the change and persistence of the headache I do feel she warrants further scanning.  We will arrange MRI of the brain.  Discussed follow-up with neurology.  She would like to pursue scanning first and then follow-up with neurology as needed.  Understands if any change or worsening symptoms she is to be evaluated.       Relevant Orders   MR Brain W Wo Contrast   Joint stiffness    Persistent joint stiffness as outlined.  Limits activity.  Has appointment with rheumatology scheduled in a couple weeks.  Continue stretches and exercise.       Muscle ache    Increased aching and joint stiffness as outlined.  Sed rate and C-reactive protein were within normal limits.  She continues to have persistent stiffness.  Has rheumatology appointment set up in 2 weeks.       Muscle twitching    Has been diagnosed with benign fasciculation syndrome.  Has seen neurology.  Stable.         Dale Spreckels, MD

## 2021-05-05 ENCOUNTER — Telehealth: Payer: Self-pay | Admitting: Internal Medicine

## 2021-05-05 NOTE — Telephone Encounter (Signed)
Patient called, she saw Dr Lorin Picket yesterday and Dr Lorin Picket was to put in a MRI. I can not find it, is there one?

## 2021-05-06 ENCOUNTER — Encounter: Payer: Self-pay | Admitting: Internal Medicine

## 2021-05-06 ENCOUNTER — Telehealth: Payer: Self-pay | Admitting: Internal Medicine

## 2021-05-06 DIAGNOSIS — R519 Headache, unspecified: Secondary | ICD-10-CM | POA: Insufficient documentation

## 2021-05-06 NOTE — Assessment & Plan Note (Signed)
Persistent joint stiffness as outlined.  Limits activity.  Has appointment with rheumatology scheduled in a couple weeks.  Continue stretches and exercise.

## 2021-05-06 NOTE — Telephone Encounter (Signed)
Order in for MRI.  Thank you.

## 2021-05-06 NOTE — Assessment & Plan Note (Signed)
Has noticed recently a daily headache.  Describes the headache as feeling like someone is pressing down on her head.  She has also noticed light sensitivity.  Heavy for a month.  Given the change and persistence of the headache I do feel she warrants further scanning.  We will arrange MRI of the brain.  Discussed follow-up with neurology.  She would like to pursue scanning first and then follow-up with neurology as needed.  Understands if any change or worsening symptoms she is to be evaluated.

## 2021-05-06 NOTE — Telephone Encounter (Signed)
Lft pt a vm to call ofc to verify ins. Thank you!

## 2021-05-06 NOTE — Assessment & Plan Note (Signed)
Has been diagnosed with benign fasciculation syndrome.  Has seen neurology.  Stable.  

## 2021-05-06 NOTE — Assessment & Plan Note (Signed)
Has had extensive work-up as outlined previously.  Continue compression hose and continue to stay hydrated.

## 2021-05-06 NOTE — Assessment & Plan Note (Signed)
Followed by psychiatry.  Remains on Zoloft.

## 2021-05-06 NOTE — Assessment & Plan Note (Signed)
Increased aching and joint stiffness as outlined.  Sed rate and C-reactive protein were within normal limits.  She continues to have persistent stiffness.  Has rheumatology appointment set up in 2 weeks.

## 2021-05-06 NOTE — Assessment & Plan Note (Signed)
Intolerant to Trelegy..  Breathing overall stable.  Has seen pulmonary and work-up as previously outlined.  Still some shortness of breath.

## 2021-05-13 ENCOUNTER — Other Ambulatory Visit: Payer: Self-pay | Admitting: *Deleted

## 2021-05-13 DIAGNOSIS — I8393 Asymptomatic varicose veins of bilateral lower extremities: Secondary | ICD-10-CM

## 2021-05-17 ENCOUNTER — Other Ambulatory Visit: Payer: Self-pay

## 2021-05-17 ENCOUNTER — Ambulatory Visit
Admission: RE | Admit: 2021-05-17 | Discharge: 2021-05-17 | Disposition: A | Discharge status: Home / Self Care | Payer: Self-pay | Source: Ambulatory Visit | Attending: Internal Medicine | Admitting: Internal Medicine

## 2021-05-17 DIAGNOSIS — R519 Headache, unspecified: Secondary | ICD-10-CM | POA: Insufficient documentation

## 2021-05-17 MED ORDER — GADOBUTROL 1 MMOL/ML IV SOLN
7.0000 mL | Freq: Once | INTRAVENOUS | Status: AC | PRN
  Administered 2021-05-17: 7 mL via INTRAVENOUS

## 2021-05-23 ENCOUNTER — Other Ambulatory Visit: Payer: Self-pay

## 2021-05-23 ENCOUNTER — Encounter: Payer: Self-pay | Admitting: Physician Assistant

## 2021-05-23 ENCOUNTER — Ambulatory Visit (INDEPENDENT_AMBULATORY_CARE_PROVIDER_SITE_OTHER): Payer: Self-pay | Admitting: Physician Assistant

## 2021-05-23 ENCOUNTER — Ambulatory Visit (HOSPITAL_COMMUNITY)
Admission: RE | Admit: 2021-05-23 | Discharge: 2021-05-23 | Disposition: A | Discharge status: Home / Self Care | Payer: Self-pay | Source: Ambulatory Visit | Attending: Surgery | Admitting: Surgery

## 2021-05-23 VITALS — BP 112/74 | HR 77 | Temp 98.3°F | Resp 20 | Ht 65.0 in | Wt 165.0 lb

## 2021-05-23 DIAGNOSIS — I8393 Asymptomatic varicose veins of bilateral lower extremities: Secondary | ICD-10-CM

## 2021-05-23 NOTE — Progress Notes (Signed)
Requested by:  Dale Kann[place], MD [address] [place],  [place] [address]  Reason for consultation: LE varicose veins   History of Present Illness   Tonya Harris is a 40 y.o. (September 12, 1981) female who presents for evaluation of bilateral lower extremity varicose veins, right greater than left. This started in her 95's when she was in college. 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 Her varicose veins worsened during her 4 pregnancies. She wore compression stockings religiously during her pregnancies for that reason. Since she wears them intermittently, more so in the summer. She has worn both knee high and thigh high compression. She does not regularly elevate. She explains that she occasionally has noticed a little swelling in her legs but mostly she feels tiredness and then her varicose veins also become very prominent when she is up on her legs a lot. She has no history of DVT. She does have family history of venous disease. Says her mother has bad varicose veins. 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 History of DVT:  no  Past Medical History:  Diagnosis Date   Gestational diabetes     Past Surgical History:  Procedure Laterality Date   APPENDECTOMY      Social History   Socioeconomic History   Marital status: Married    Spouse name: Not on file   Number of children: 4   Years of education: Not on file   Highest education level: Master's degree (e.g., MA, MS,  MEng, MEd, MSW, MBA)  Occupational History   Occupation: Runner, broadcasting/film/video   Occupation: Stay at home mom  Tobacco Use   Smoking status: Never   Smokeless tobacco: Never  Vaping Use   Vaping Use: Never used  Substance and Sexual Activity   Alcohol use: Not Currently   Drug use: Not Currently   Sexual activity: Yes    Partners: Male  Other Topics Concern   Not on file  Social History Narrative   Right handed      Master's degree in teaching       Stay at 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 30 tablet 2   Multiple Vitamin (MULTIVITAMIN) tablet Take 1 tablet by mouth daily.     pantoprazole (PROTONIX) 40 MG tablet TAKE 1 TABLET BY MOUTH EVERY DAY 90 tablet 1   sertraline (ZOLOFT) 100 MG tablet Take 100 mg by mouth daily.     No current  facility-administered medications for this visit.    No Known Allergies  REVIEW OF SYSTEMS (negative unless checked):   Cardiac:  []  Chest pain or chest pressure? []  Shortness of breath upon activity? []  Shortness of breath when lying flat? []  Irregular heart rhythm?  Vascular:  []  Pain in calf, thigh, or hip brought on by walking? []  Pain in feet at night that wakes you up from your sleep? []  Blood clot in your veins? []  Leg swelling?  Pulmonary:  []  Oxygen at home? []  Productive cough? []  Wheezing?  Neurologic:  []  Sudden weakness in arms or legs? []  Sudden numbness in arms or legs? []  Sudden onset of difficult  speaking or slurred speech? []  Temporary loss of vision in one eye? []  Problems with dizziness?  Gastrointestinal:  []  Blood in stool? []  Vomited blood?  Genitourinary:  []  Burning when urinating? []  Blood in urine?  Psychiatric:  []  Major depression  Hematologic:  []  Bleeding problems? []  Problems with blood clotting?  Dermatologic:  []  Rashes or ulcers?  Constitutional:  []  Fever or chills?  Ear/Nose/Throat:  []  Change in hearing? []  Nose bleeds? []  Sore throat?  Musculoskeletal:  []  Back pain? []  Joint pain? []  Muscle pain?   Physical Examination     Vitals:   05/23/21 1335  BP: 112/74  Pulse: 77  Resp: 20  Temp: 98.3 F (36.8 C)  TempSrc: Temporal  SpO2: 97%  Weight: 165 lb (74.8 kg)  Height: 5\' 5"  (1.651 m)   Body mass index is 27.46 kg/m.  General:  WDWN in NAD; vital signs documented above Gait: Normal HENT: WNL, normocephalic Pulmonary: normal non-labored breathing , without wheezing Cardiac: regular HR, without  Murmurs without carotid bruit Abdomen: soft, NT, no masses Vascular Exam/Pulses:2+ radial, 2+ femoral, popliteal, dp and PT pulses bilaterally. Feet warm and well perfused Extremities: with varicose veins, without reticular veins, without edema, without stasis pigmentation, without lipodermatosclerosis, without ulcers  Right distal thigh and calf, medial varicosity  Right medial thigh varicose vein  Left distal thigh and calf varicose veins  Musculoskeletal: no muscle wasting or atrophy  Neurologic: A&O X 3;  No focal weakness or paresthesias are detected Psychiatric:  The pt has Normal affect.  Non-invasive Vascular Imaging   BLE Venous Insufficiency Duplex (05/23/21):  RLE:  No DVT and SVT GSV reflux SFJ, mid thigh to prox calf GSV diameter 0.23-0.67 No SSV reflux  CFV, popliteal deep venous reflux AASV reflux proximal and mid thigh, <0.3 cm   LLE: No DVT and SVT GSV reflux SFJ to prox calf GSV diameter  0.37-0.63 No SSV reflux  CF and FV deep venous reflux   Medical Decision Making   [name] is a 40 y.o. female who presents with: BLE chronic venous insufficiency with varicose veins. Her duplex today shows bilateral deep and superficial reflux. She has no DVT or SVR. She has an AASV in the right thigh that is also weak. She has history of prior vein procedures and from how she describes it sounds like prior ablations or ligation of her GSV. It is possible these re cannulated as they are seen on duplex today. Her veins are rather on the small size although she does have large varicosities bilaterally. She potentially could be a candidate for an ablation on the left leg. I will discuss today's duplex findings with Dr. and follow up with patient regarding her options. In the mean time I have recommended continuing conservative therapy with daily elevation, compression stockings,  exercise, and refraining from prolonged sitting or standing I discussed with the patient the use of her 20-30 mm thigh high compression stockings and need for 3 month trial of such. She was measured for a pair and purchased a pair today. I will call patient to review her options over the phone once I can review her studies with Dr. Edilia Bo to determine her best options  Graceann Congress, PA-C [facility] Office: [phone]  05/23/2021, 1:51 PM  On call MD: Darrick Penna

## 2021-06-15 ENCOUNTER — Ambulatory Visit (INDEPENDENT_AMBULATORY_CARE_PROVIDER_SITE_OTHER): Payer: Self-pay | Admitting: Internal Medicine

## 2021-06-15 ENCOUNTER — Encounter: Payer: Self-pay | Admitting: Internal Medicine

## 2021-06-15 ENCOUNTER — Other Ambulatory Visit: Payer: Self-pay

## 2021-06-15 VITALS — BP 112/76 | HR 78 | Temp 97.0°F | Resp 14 | Ht 65.0 in | Wt 162.0 lb

## 2021-06-15 DIAGNOSIS — M2560 Stiffness of unspecified joint, not elsewhere classified: Secondary | ICD-10-CM

## 2021-06-15 DIAGNOSIS — R253 Fasciculation: Secondary | ICD-10-CM

## 2021-06-15 DIAGNOSIS — J4530 Mild persistent asthma, uncomplicated: Secondary | ICD-10-CM

## 2021-06-15 DIAGNOSIS — F419 Anxiety disorder, unspecified: Secondary | ICD-10-CM

## 2021-06-15 DIAGNOSIS — Z0000 Encounter for general adult medical examination without abnormal findings: Secondary | ICD-10-CM

## 2021-06-15 DIAGNOSIS — I8393 Asymptomatic varicose veins of bilateral lower extremities: Secondary | ICD-10-CM

## 2021-06-15 NOTE — Progress Notes (Signed)
Patient ID: Tonya Harris, female   DOB: 02/07/1981, 40 y.o.   MRN: [id number]   Subjective:    Patient ID: Tonya Harris, female    DOB: 04-24-1981, 39 y.o.   MRN: [id number]  HPI This visit occurred during the SARS-CoV-2 public health emergency.  Safety protocols were in place, including screening questions prior to the visit, additional usage of staff PPE, and extensive cleaning of exam room while observing appropriate contact time as indicated for disinfecting solutions.   Patient here for her physical exam.  She was recently evaluated by rheumatology for joint aches and stiffness.  Note reviewed.  Labs checked and ok.  Still having aching and stiffness.  Notices even more after increased activity (mowing, etc).  Breathing overall appears to be stable - from previous visits.  Has seen pulmonary.  Also has been evaluated by cardiology.  Still with muscle twitches.  No increased cough or congestion reported.  No increased abdominal pain currently.  Bowels moving.  Has noticed some changes with her eyes - noticed more over the last couple of weeks.  Noticed floaters, eyes watering and twitching.  Feet twitching and feet hurt.  Discussed possibility of podiatry referral - orthotics, etc?    Past Medical History:  Diagnosis Date   Gestational diabetes    Past Surgical History:  Procedure Laterality Date   APPENDECTOMY     Family History  Problem Relation Age of Onset   Hypertension Mother    Hypertension Father    Social History   Socioeconomic History   Marital status: Married    Spouse name: Not on file   Number of children: 4   Years of education: Not on file   Highest education level: Master's degree (e.g., MA, MS, MEng, MEd, MSW, MBA)  Occupational History   Occupation: Runner, broadcasting/film/video   Occupation: Stay at home mom  Tobacco Use   Smoking status: Never   Smokeless tobacco: Never  Vaping Use   Vaping Use: Never used  Substance and Sexual Activity   Alcohol use: Not Currently    Drug use: Not Currently   Sexual activity: Yes    Partners: Male  Other Topics Concern   Not on file  Social History Narrative   Right handed      Master's degree in teaching       Stay at 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  HENT:  Negative for congestion, sinus pressure and sore throat.   Eyes:  Negative for pain and visual disturbance.  Respiratory:  Negative for cough and chest tightness.        Breathing overall stable.   Cardiovascular:  Negative for palpitations and leg swelling.  Gastrointestinal:  Negative for abdominal pain, diarrhea, nausea and vomiting.  Genitourinary:  Negative for difficulty urinating and dysuria.  Musculoskeletal:        Joint swelling and stiffness.   Skin:  Negative for color change and rash.  Neurological:  Negative for dizziness, light-headedness and headaches.  Hematological:  Negative for adenopathy. Does not bruise/bleed easily.  Psychiatric/Behavioral:  Negative for decreased concentration and dysphoric mood.       Objective:  Physical Exam Vitals reviewed.  Constitutional:      General: She is not in acute distress.    Appearance: Normal appearance.  HENT:     Head: Normocephalic and atraumatic.     Right Ear: External ear normal.     Left Ear: External ear normal.  Eyes:     General: No scleral icterus.       Right eye: No discharge.        Left eye: No discharge.     Conjunctiva/sclera: Conjunctivae normal.  Neck:     Thyroid: No thyromegaly.  Cardiovascular:     Rate and Rhythm: Normal rate and regular rhythm.  Pulmonary:     Effort: No respiratory distress.     Breath sounds: Normal breath  sounds. No wheezing.  Abdominal:     General: Bowel sounds are normal.     Palpations: Abdomen is soft.     Tenderness: There is no abdominal tenderness.  Musculoskeletal:        General: No swelling or tenderness.     Cervical back: Neck supple. No tenderness.  Lymphadenopathy:     Cervical: No cervical adenopathy.  Skin:    Findings: No erythema or rash.  Neurological:     Mental Status: She is alert.  Psychiatric:        Mood and Affect: Mood normal.        Behavior: Behavior normal.    BP 112/76   Pulse 78   Temp (!) 97 F (36.1 C) (Temporal)   Resp 14   Ht 5\' 5"  (1.651 m)   Wt 162 lb (73.5 kg)   SpO2 97%   BMI 26.96 kg/m  Wt Readings from Last 3 Encounters:  06/15/21 162 lb (73.5 kg)  05/23/21 165 lb (74.8 kg)  05/04/21 163 lb 9.6 oz (74.2 kg)    Outpatient Encounter Medications as of 06/15/2021  Medication Sig   magnesium oxide (MAG-OX) 400 (240 Mg) MG tablet Take 1 tablet (400 mg total) by mouth daily.   Multiple Vitamin (MULTIVITAMIN) tablet Take 1 tablet by mouth daily.   pantoprazole (PROTONIX) 40 MG tablet TAKE 1 TABLET BY MOUTH EVERY DAY   sertraline (ZOLOFT) 100 MG tablet Take 100 mg by mouth daily.   No facility-administered encounter medications on file as of 06/15/2021.     Lab Results  Component Value Date   WBC 5.9 03/16/2021   HGB 13.1 03/16/2021   HCT 38.5 03/16/2021   PLT 285.0 03/16/2021   GLUCOSE 90 03/16/2021   ALT 16 03/16/2021   AST 19 03/16/2021   NA 136 03/16/2021   K 4.2 03/16/2021   CL 103 03/16/2021   CREATININE 0.75 03/16/2021   BUN 13 03/16/2021   CO2 24 03/16/2021   TSH 1.30 03/16/2021    VAS US LOWER EXTREMITY VENOUS REFLUX  Result Date: 05/23/2021  Lower Venous Reflux Study Patient Name:  [facility]ZABETH [facility]  Date of Exam:   05/23/2021 Medical Rec #: [id number]          Accession #:    [id number] Date of Birth: [birth year]          Patient Gender: F Patient Age:   40Y [facility]am [facility]  [facility] Procedure:       VAS US LOWER EXTREMITY VENOUS REFLUX Referring Phys: [id number] CHRISTOPHER S DICKSON --------------------------------------------------------------------------------  Indications: Varicosities.  Performing Technologist: Jeb LeveringJill Parker RDMS, RVT  Examination Guidelines: A complete evaluation includes B-mode imaging, spectral Doppler, color Doppler, and power Doppler  as needed of all accessible portions of each vessel. Bilateral testing is considered an integral part of a complete examination. Limited examinations for reoccurring indications may be performed as noted. The reflux portion of the exam is performed with the patient in reverse Trendelenburg. Significant venous reflux is defined as >500 ms in the superficial venous system, and >1 second in the deep venous system.  Venous Reflux Times +--------------+---------+------+-----------+------------+--------+ RIGHT         Reflux NoRefluxReflux TimeDiameter cmsComments                         Yes                                  +--------------+---------+------+-----------+------------+--------+ CFV                     yes   >1 second                      +--------------+---------+------+-----------+------------+--------+ FV mid        no                                             +--------------+---------+------+-----------+------------+--------+ Popliteal               yes   >1 second                      +--------------+---------+------+-----------+------------+--------+ GSV at SFJ              yes    >500 ms      0.67             +--------------+---------+------+-----------+------------+--------+ GSV prox thighno                            0.35             +--------------+---------+------+-----------+------------+--------+ GSV mid thigh           yes    >500 ms      0.23             +--------------+---------+------+-----------+------------+--------+ GSV dist thigh          yes    >500 ms      0.23              +--------------+---------+------+-----------+------------+--------+ GSV at knee             yes    >500 ms      0.67             +--------------+---------+------+-----------+------------+--------+ GSV prox calf           yes    >500 ms      0.27             +--------------+---------+------+-----------+------------+--------+ SSV Pop Fossa no                            0.28             +--------------+---------+------+-----------+------------+--------+ SSV prox calf no  0.3              +--------------+---------+------+-----------+------------+--------+ SSV mid calf  no                            0.32             +--------------+---------+------+-----------+------------+--------+ AASV Prox               yes    >500 ms      0.31             +--------------+---------+------+-----------+------------+--------+ AASV Mid                yes    >500 ms      0.29             +--------------+---------+------+-----------+------------+--------+  +--------------+---------+------+-----------+------------+--------+ LEFT          Reflux NoRefluxReflux TimeDiameter cmsComments                         Yes                                  +--------------+---------+------+-----------+------------+--------+ CFV                     yes   >1 second                      +--------------+---------+------+-----------+------------+--------+ FV mid                  yes   >1 second                      +--------------+---------+------+-----------+------------+--------+ Popliteal     no                                             +--------------+---------+------+-----------+------------+--------+ GSV at SFJ              yes    >500 ms      0.57             +--------------+---------+------+-----------+------------+--------+ GSV prox thigh          yes    >500 ms      0.39              +--------------+---------+------+-----------+------------+--------+ GSV mid thigh           yes    >500 ms      0.37             +--------------+---------+------+-----------+------------+--------+ GSV dist thigh          yes    >500 ms      0.63             +--------------+---------+------+-----------+------------+--------+ GSV at knee             yes    >500 ms      0.58             +--------------+---------+------+-----------+------------+--------+ GSV prox calf           yes    >500 ms      0.44             +--------------+---------+------+-----------+------------+--------+ SSV Pop Fossa no  0.18             +--------------+---------+------+-----------+------------+--------+ SSV prox calf no                            0.3              +--------------+---------+------+-----------+------------+--------+ SSV mid calf  no                            0.22             +--------------+---------+------+-----------+------------+--------+   Summary: Right: - No evidence of deep vein thrombosis seen in the right lower extremity, from the common femoral through the popliteal veins. - No evidence of superficial venous thrombosis in the right lower extremity. - Venous reflux is noted in the right common femoral vein. - Venous reflux is noted in the right sapheno-femoral junction. - Venous reflux is noted in the right greater saphenous vein in the thigh. - Venous reflux is noted in the right greater saphenous vein in the calf. - Venous reflux is noted in the right popliteal vein. - Venous reflux is noted in the right AASV.  Left: - No evidence of deep vein thrombosis seen in the left lower extremity, from the common femoral through the popliteal veins. - No evidence of superficial venous thrombosis in the left lower extremity. - Venous reflux is noted in the left common femoral vein. - Venous reflux is noted in the left sapheno-femoral junction. - Venous  reflux is noted in the left greater saphenous vein in the thigh. - Venous reflux is noted in the left greater saphenous vein in the calf. - Venous reflux is noted in the left femoral vein.  *See table(s) above for measurements and observations. Electronically signed by Fabienne Bruns MD on 05/23/2021 at 7:41:19 PM.    Final        Assessment & Plan:   Problem List Items Addressed This Visit     Anxiety    Seeing Dr Maryruth Bun.  Continues zoloft.        Asthma, mild persistent    Intolerant to trelegy.  Breathing overall stable.  Has seen pulmonary.  Follow.        Healthcare maintenance    Physical today 06/15/21. Notify when ready for mammogram.       Joint stiffness    Persistent joint stiffness.  Saw rheumatology.  Labs ok.  Discussed stretching and exercise.  Follow.       Muscle twitching    Has been diagnosed with benign fasciculation syndrome.  Evaluated by neurology.  Follow.        Varicose veins of both lower extremities    Vascular surgery evaluation.         Dale Angier, MD

## 2021-06-20 ENCOUNTER — Encounter: Payer: Self-pay | Admitting: Internal Medicine

## 2021-06-20 NOTE — Assessment & Plan Note (Signed)
Persistent joint stiffness.  Saw rheumatology.  Labs ok.  Discussed stretching and exercise.  Follow.

## 2021-06-20 NOTE — Assessment & Plan Note (Signed)
Intolerant to trelegy.  Breathing overall stable.  Has seen pulmonary.  Follow.   

## 2021-06-20 NOTE — Assessment & Plan Note (Signed)
Has been diagnosed with benign fasciculation syndrome.  Evaluated by neurology.  Follow.

## 2021-06-20 NOTE — Assessment & Plan Note (Signed)
Physical today 06/15/21. Notify when ready for mammogram.

## 2021-06-20 NOTE — Assessment & Plan Note (Signed)
Vascular surgery evaluation.

## 2021-06-20 NOTE — Assessment & Plan Note (Signed)
Seeing Dr Maryruth Bun.  Continues zoloft.

## 2021-06-21 ENCOUNTER — Encounter: Payer: Self-pay | Admitting: Internal Medicine

## 2021-06-22 NOTE — Telephone Encounter (Signed)
Reviewed.  She has seen cardiology previously with w/up unrevealing.  If she is continuing to have these feelings and now more frequent, we can get her back in with cardiology to confirm if any further w/up or evaluation warranted.  I would also like for her to make Dr Maryruth Bun aware - so can help deal with the anxiety related to her symptoms and "not knowing the etiology"

## 2021-06-22 NOTE — Telephone Encounter (Signed)
LMTCB

## 2021-06-22 NOTE — Telephone Encounter (Signed)
Please advise 

## 2021-06-24 NOTE — Telephone Encounter (Signed)
She has seen Dr Lady Gary at [place].  Please call and schedule f/u.

## 2021-06-24 NOTE — Telephone Encounter (Signed)
Patient is agreeable to see cardiology again and she has an upcoming appt with Dr Maryruth Bun the beginning of September. Let me know if I need to do anything about getting her back in with cardiology.

## 2021-07-01 ENCOUNTER — Telehealth: Payer: Self-pay

## 2021-07-01 NOTE — Telephone Encounter (Signed)
LM for patient to call back regarding appt on 8/29.

## 2021-07-04 ENCOUNTER — Telehealth: Payer: Self-pay | Admitting: Internal Medicine

## 2021-07-04 ENCOUNTER — Ambulatory Visit: Payer: Self-pay | Admitting: Internal Medicine

## 2021-07-04 NOTE — Telephone Encounter (Signed)
Spoke with patient. She is not experiencing the increased SOB or low heart rate at this time. Feeling "normal" again. Advised that given she is COVID positive, we cannot assume that these symptoms are her chronic issues and should be evaluated at the ED if symptoms return to determine nothing more acute is going on. Patient gave verbal understanding and did agree to go to the ED if any acute symptoms return. Patient has also been scheduled with Dr Lady Gary on 9/12.

## 2021-07-04 NOTE — Telephone Encounter (Signed)
See note.  Feeling better.  Per nurse, does not feel needs to be evaluated at this time.  Follow.

## 2021-07-04 NOTE — Telephone Encounter (Signed)
error 

## 2021-07-04 NOTE — Telephone Encounter (Signed)
Left message to return call on both mobile and home phone.

## 2021-07-04 NOTE — Telephone Encounter (Signed)
Providing access nurse documentation.      

## 2021-07-04 NOTE — Telephone Encounter (Signed)
Patient calling back in, states she was going to discuss ongoing shortness of breath and low heart rate post Covid. Patient sounded winded on the phone. Transferred to access nurse to triage

## 2021-07-04 NOTE — Telephone Encounter (Signed)
Pt scheduled with Dr Lady Gary 9/12 @ 8:45

## 2021-07-04 NOTE — Telephone Encounter (Signed)
Access nurse called back stating that the patient refused to go to the ED. The patient is having SOB and heart  rate increasing more often post Covid.

## 2021-07-12 ENCOUNTER — Encounter: Payer: Self-pay | Admitting: Internal Medicine

## 2021-07-12 ENCOUNTER — Telehealth: Payer: Self-pay | Admitting: Internal Medicine

## 2021-07-12 DIAGNOSIS — R0789 Other chest pain: Secondary | ICD-10-CM

## 2021-07-12 DIAGNOSIS — R6889 Other general symptoms and signs: Secondary | ICD-10-CM

## 2021-07-12 DIAGNOSIS — R0602 Shortness of breath: Secondary | ICD-10-CM

## 2021-07-12 NOTE — Telephone Encounter (Signed)
Patient calling in requesting a new cardiology referral. Currently seeing [facility]. She would prefer someone PCP recommends in [place].   Please advise

## 2021-07-13 NOTE — Telephone Encounter (Signed)
Per phone message 07/12/21, I have already placed a new order for referral to cardiology in [place].  Per note, Marijo Conception has already sent information.  Also, is she ok to stay in town (go to [facility]) - for eye evaluation.  If so, ok to referl.

## 2021-07-13 NOTE — Telephone Encounter (Signed)
Order placed for cardiology referral to Dr Tresa Endo in [place] per pt request.  See phone message.  Thank. You.

## 2021-07-15 NOTE — Telephone Encounter (Signed)
Patient would like to go to [facility], is there someone you recommend?

## 2021-07-15 NOTE — Telephone Encounter (Signed)
Order placed for ophthalmology referral in [place] as pt requested.

## 2021-07-19 NOTE — Telephone Encounter (Signed)
Patient has an appointment in November with [facility] but their office contacted her and they have a sooner opening with another provider this week and she would like to know if it is ok to see another doctor.Please advise.

## 2021-07-19 NOTE — Telephone Encounter (Signed)
LM for patient letting her know that Dr Lorin Picket is ok with her seeing another provider in their office this week.

## 2021-07-20 ENCOUNTER — Ambulatory Visit (INDEPENDENT_AMBULATORY_CARE_PROVIDER_SITE_OTHER): Payer: Self-pay | Admitting: Cardiology

## 2021-07-20 ENCOUNTER — Encounter (HOSPITAL_BASED_OUTPATIENT_CLINIC_OR_DEPARTMENT_OTHER): Payer: Self-pay | Admitting: Cardiology

## 2021-07-20 ENCOUNTER — Other Ambulatory Visit: Payer: Self-pay

## 2021-07-20 VITALS — BP 114/60 | HR 82 | Ht 65.0 in | Wt 160.0 lb

## 2021-07-20 DIAGNOSIS — Z7189 Other specified counseling: Secondary | ICD-10-CM

## 2021-07-20 DIAGNOSIS — R0602 Shortness of breath: Secondary | ICD-10-CM

## 2021-07-20 DIAGNOSIS — R0789 Other chest pain: Secondary | ICD-10-CM

## 2021-07-20 DIAGNOSIS — Z712 Person consulting for explanation of examination or test findings: Secondary | ICD-10-CM

## 2021-07-20 NOTE — Progress Notes (Signed)
Cardiology Office Note:    Date:  07/20/2021   ID:  Eddie Candle, DOB 19-Sep-1981, MRN [id number]  PCP:  Dale [place], MD  Cardiologist:  Jodelle Red, MD  Referring MD: Dale Hopkins, MD   Chief Complaint  Patient presents with   New Patient (Initial Visit)   Shortness of Breath   Headache   Chest Pain     History of Present Illness:    Tonya Harris is a 40 y.o. female with a hx of gestational diabetes, who is seen as a new consult at the request of Dale [place], MD for the evaluation and management of chest pain.  Note from Dr. Lorin Picket dated 06/15/21 and 05/04/21 reviewed. Noted history of shortness of breath, previously evaluated by [facility].   Chest pain: -Initial onset: 2019 -Quality: Feels like "her heart hurts" and that she has been "punched in the stomach" but from the heart. Some days she feels okay, and on others she feels faint like she is going to collapse or pass out. -Frequency: Often -Duration: Intermittent -Associated symptoms: Anxiety, Muscle twitching/stiffness, persistent chills, Exertional shortness of breath, vision changes (dark spots), ear pain, bilateral edema in fingers -Aggravating/alleviating factors: Anxiety. Going to bed at night she would have panic attacks as her body would begin to twitch and otherwise feel worse. At other times lying down and bed helps her feel better, possibly related to bloodflow. Some relief with drinking water. Generally feels worse in the morning. After taking a shower, she feels like her heart is beating out of her chest, and she can feel it in her fingers. She also may become lightheaded and need to rest.  -Comorbidities: Raynaud's, Varicose veins -Exercise level: At baseline she is a "high-energy person," she is frustrated that she is not able to function like she used to. Lately she has had shortness of breath with climbing stairs. Walking daily, used to run. When she runs she feels miserable and  near-syncopal. Normally feels okay while walking. -Cardiac ROS: +shortness of breath, no PND, no orthopnea, no LE edema, +near-syncope   Generally she feels muscle stiffness throughout her body.  Currently she is going to counseling for her stress and anxiety.  Previously Dr. Sherryll Burger was her neurologist.  She denies any palpitations. No headaches, orthopnea, or PND. Also has no lower extremity edema. Tearful at times.   Past Medical History:  Diagnosis Date   Gestational diabetes     Past Surgical History:  Procedure Laterality Date   APPENDECTOMY      Current Medications: Current Outpatient Medications on File Prior to Visit  Medication Sig   magnesium oxide (MAG-OX) 400 (240 Mg) MG tablet Take 1 tablet (400 mg total) by mouth daily.   Multiple Vitamin (MULTIVITAMIN) tablet Take 1 tablet by mouth daily.   pantoprazole (PROTONIX) 40 MG tablet TAKE 1 TABLET BY MOUTH EVERY DAY   sertraline (ZOLOFT) 100 MG tablet Take 100 mg by mouth daily.   No current facility-administered medications on file prior to visit.     Allergies:   Patient has no known allergies.   Social History   Tobacco Use   Smoking status: Never   Smokeless tobacco: Never  Vaping Use   Vaping Use: Never used  Substance Use Topics   Alcohol use: Not Currently   Drug use: Not Currently    Family History: family history includes Hypertension in her father and mother.  ROS:   Please see the history of present illness.  Additional pertinent ROS:  Constitutional: Positive for chills. Negative for fever, night sweats, unintentional weight loss.  HENT: Positive for ear pain. Negative for hearing loss.   Eyes: Positive for vision changes. Negative for loss of vision and eye pain.  Respiratory: Positive for shortness of breath. Negative for cough, sputum, wheezing.   Cardiovascular: See HPI. Gastrointestinal: Negative for abdominal pain, melena, and hematochezia.  Genitourinary: Negative for dysuria and  hematuria.  Musculoskeletal: Positive for muscle twitches, stiffness. Negative for falls and myalgias.  Skin: Negative for itching and rash.  Neurological: Negative for focal weakness, and focal sensory changes.  Endo/Heme/Allergies: Does not bruise/bleed easily.     EKGs/Labs/Other Studies Reviewed:    The following studies were reviewed today: Monitor (per note 09/16/20) 21-day Holter monitor at today's visit which revealed sinus rhythm and sinus tachycardia with rare PACs and PVCs.  No evidence of sustained arrhythmias or abnormalities.   LE Venous Reflux 05/23/2021: Right:  - No evidence of deep vein thrombosis seen in the right lower extremity, from the common femoral through the popliteal veins.  - No evidence of superficial venous thrombosis in the right lower  extremity.  - Venous reflux is noted in the right common femoral vein.  - Venous reflux is noted in the right sapheno-femoral junction.  - Venous reflux is noted in the right greater saphenous vein in the thigh.  - Venous reflux is noted in the right greater saphenous vein in the calf.  - Venous reflux is noted in the right popliteal vein.  - Venous reflux is noted in the right AASV.     Left:  - No evidence of deep vein thrombosis seen in the left lower extremity, from the common femoral through the popliteal veins.  - No evidence of superficial venous thrombosis in the left lower  extremity.  - Venous reflux is noted in the left common femoral vein.  - Venous reflux is noted in the left sapheno-femoral junction.  - Venous reflux is noted in the left greater saphenous vein in the thigh.  - Venous reflux is noted in the left greater saphenous vein in the calf.  - Venous reflux is noted in the left femoral vein.   Cardiopulmonary Exercise Test 11/02/2020: Interpretation   Notes: Patient gave a very good effort. Pulse-oximetry remained 95-99% for the duration of exercise (artifact in earlier exercise). Exercise was  performed on on a treadmill beginning at 2.26mph and 0% grade increasing to steady 3.0 mph and 0% grade with 2.5% grade every other minute, with further modifications to challenge patient's fitness level (see attached document for time-down stages)   ECG:  Resting ECG in normal sinus rhythm. HR response appropriate. There were no sustained arrhythmias or ST-T changes. BP response appropriate.   PFT:  Pre-exercise spirometry was within normal limits. The MVV was normal. Post-exercise spirometry within normal limits.   CPX:  Exercise Capacity- Exercise testing with gas exchange demonstrates a normal peak VO2 of 29.7 ml/kg/min (102% of the age/gender/weight matched sedentary norms). The RER of 1.10 indicates a maximal effort. When adjusted to the patient's ideal body weight of 142.2 lb (64.5 kg) the peak VO2 is 33.6 ml/kg (ibw)/min (110% of the ibw-adjusted predicted).   Cardiovascular response- The O2pulse (a surrogate for stroke volume) increased with incremental exercise reaching peak at 12 ml/beat (100% predicted).   Ventilatory response- The VE/VCO2 slope is normal. The oxygen uptake efficiency slope (OUES) is normal. The VO2 at the ventilatory threshold was normal at 77% of the predicted peak VO2. At  peak exercise, the ventilation reached 63% of the measured MVV and breathing reserve was 44 indicating ventilatory reserve remained.  PETCO2 was normal at 36 mmHg during exercise.   Conclusion: Exercise testing with gas exchange demonstrates normal functional capacity when compared to matched sedentary norms. There is no indication for cardiopulmonary abnormality or exercise-induced bronchospasm.   Cardiac MRI 09/28/20 ([facility]) Cardiac MRI performed with and without contrast on a 3.0 T MRI system to evaluate myocardial morphology,  function, and viability in a 40 year old female patient with a past medical history significant for  Raynaud's, GERD, and anxiety about health who presented for an acute  visit.  Patient continues to  experience severe shortness of breath with associated chest pain with minimal activity. No evidence of  sustained arrhythmias or abnormalities.  Stress echocardiogram on 07/02/19 revealed normal LV systolic  function with an EF estimated greater than 55% with no significant valvular abnormalities.     Findings:   1.  The left ventricle is normal in cavity size and wall thickness.  Systolic function is normal.  The  calculated LVEF is 61%.  There are no regional wall motion abnormalities.   2.  The right ventricle is normal in cavity size, wall thickness, and systolic function.     3.  The atria are normal in size.   4. The aortic valve is trileaflet in morphology. There is no significant aortic valve stenosis or  regurgitation. There is no significant valvular disease.   5. Delayed enhancement imaging demonstrates no evidence of myocardial infarction, scar or infiltrative  disease.   6.  No intracardiac thrombus visualized. Normal appearing pericardium.   CONCLUSION: Normal study.   CORE EXAM    MEASUREMENTS   ----------------------------------------------------------------------------------------      VOLUMETRIC ANALYSIS       ----------------------------------------------  .---------------------------------------------------------.                   LV     Reference  RV     Reference   +-----+-----------+-------+-----------+-------+-----------+   EDV  ml         123.8   (94-175)  124.8   (94-178)         ml/m^2      69.2   (62-96)    69.7   (61-98)     ESV  ml          48.1   (27-64)    43.7   (25-77)          ml/m^2      26.9   (17-36)    24.4   (17-43)     CO   L/min       6.36              6.82                    L/min/m^2   3.55              3.81                    g/m^2                                            SV   ml          75.7   (61-117)   81.1   (59-111)  ml/m^2       42.3   (40-65)    45.3   (38-62)     EF   %           61.1   (57-75)    65.0   (51-75)    '-----+-----------+-------+-----------+-------+-----------'           CARDIAC OUTPUT HR:  84.0 BPM      LV DIMENSIONS       ----------------------------------------------          WALL THICKNESS - ANTEROSEPTAL:  0.7 cm          WALL THICKNESS - INFEROLATERAL:  0.6 cm          LV EDD:  4.9 cm          LV ESD:  3.0 cm       LA DIMENSIONS (LV SYSTOLE)       ----------------------------------------------          DIAMETER:  3.1 cm          AREA - 2 CHAMBER:  19.7 cm^2          LENGTH - 2 CHAMBER:  4.8 cm          AREA - 4 CHAMBER:  18.1 cm^2          LENGTH - 4 CHAMBER:  5.1 cm          VOLUME:  63 ml       AORTIC ROOT DIMENSIONS       ----------------------------------------------          SINUS OF VALSALVA:  2.7 cm          AORTIC ROOT SIZE:  Normal    ANATOMY   ----------------------------------------------------------------------------------------      LEFT VENTRICLE       ----------------------------------------------          WALL THICKNESS:  Normal          CAVITY SIZE:  Normal          MASS/THROMBUS:  None       RIGHT VENTRICLE       ----------------------------------------------          WALL THICKNESS:  Normal          CONTRACTILITY:  Normal          CAVITY SIZE:  Normal          MASS/THROMBUS:  None       INTERVENTRICULAR SEPTUM       ----------------------------------------------   VENTRICULAR SEPTUM:  Normal       LEFT ATRIUM       ----------------------------------------------          CAVITY SIZE:  Normal          MASS/THROMBUS:  None       RIGHT ATRIUM       ----------------------------------------------          CAVITY SIZE:  Normal          MASS/THROMBUS:  None   ADDITIONAL FINDINGS:  None       PERICARDIUM       ----------------------------------------------          Normal          EFFUSION:  None       PLEURAL EFFUSION        ----------------------------------------------   None     VALVES   ----------------------------------------------------------------------------------------      AORTIC VALVE       ----------------------------------------------  AORTIC VALVE LEAFLETS:  Trileaflet          AORTIC REGURGITATION:  None          AORTIC STENOSIS:  None       MITRAL VALVE       ----------------------------------------------          MITRAL VALVE LEAFLETS:  Normal Leaflets          MITRAL REGURGITATION:  None          MITRAL STENOSIS:  None       TRICUSPID VALVE       ----------------------------------------------          TRICUSPID VALVE LEAFLETS:  Normal Leaflets          TRICUSPID REGURGITATION:  None          TRICUSPID STENOSIS:  None       PULMONIC VALVE       ----------------------------------------------          PULMONIC VALVE LEAFLETS:  Normal Leaflets          PULMONIC REGURGITATION:  None          PULMONIC STENOSIS:  None    17 SEGMENT   ----------------------------------------------------------------------------------------  .------------------------------------------------------------------------------------------.   Segments            Wall Motion   Hyperenhancement  Stress Perfusion  Interpretation   +--------------------+--------------+------------------+------------------+----------------+   Base Anterior       Normal/Hyper  None                                Normal            Base Anteroseptal   Normal/Hyper  None                                Normal            Base Inferoseptal   Normal/Hyper  None                                Normal            Base Inferior       Normal/Hyper  None                                Normal            Base Inferolateral  Normal/Hyper  None                                Normal            Base Anterolateral  Normal/Hyper  None                                Normal            Mid Anterior         Normal/Hyper  None                                Normal  Mid Anteroseptal    Normal/Hyper  None                                Normal            Mid Inferoseptal    Normal/Hyper  None                                Normal            Mid Inferior        Normal/Hyper  None                                Normal            Mid Inferolateral   Normal/Hyper  None                                Normal            Mid Anterolateral   Normal/Hyper  None                                Normal            Apical Anterior     Normal/Hyper  None                                Normal            Apical Septal       Normal/Hyper  None                                Normal            Apical Inferior     Normal/Hyper  None                                Normal            Apical Lateral      Normal/Hyper  None                                Normal            Apex                Normal/Hyper  None                                Normal           +--------------------+--------------+------------------+------------------+----------------+   RV Segments         Wall Motion   Hyperenhancement  Stress Perfusion  Interpretation   +--------------------+--------------+------------------+------------------+----------------+   RV Basal Anterior   Normal/Hyper  None                                Normal  RV Basal Inferior   Normal/Hyper  None                                Normal            RV Mid              Normal/Hyper  None                                Normal            RV Apical           Normal/Hyper  None                                Normal           '--------------------+--------------+------------------+------------------+----------------'       FINDINGS       ----------------------------------------------          LV SCAR SIZE (17 SEGMENT):  0 %    SCAN INFO    ==========================================================================================================   GENERAL   ----------------------------------------------------------------------------------------      CONTRAST AGENT       ----------------------------------------------          TYPE:  Dotarem          GD CONCENTRATION:  0.5 M          VOLUME ADMINISTERED:  22 ml          DOSAGE:  0.15 mmol/kg       SEDATION       ----------------------------------------------          SEDATION USED?:  No       VITALS       ----------------------------------------------          HEIGHT:  65.00 in          SYSTOLIC BP:  125 mmHg          HEIGHT:  165.1 cm          DIASTOLIC BP:  75 mmHg          WEIGHT:  158.07 lbs          BASELINE HR:  81 BPM          WEIGHT:  71.70 kgs          HEART RHYTHM:  Normal Sinus Rhythm          BSA:  1.79 m^2       PULSE SEQUENCES       ----------------------------------------------          SSFP cine, 2D LGE segmented, 2D LGE single-shot, Black-blood LGE (or Grey-blood), Pre-contrast T1          mapping, Post-contrast T1 mapping, First-pass perfusion without stress       SETUP       ----------------------------------------------          SCAN TYPE:  Both          SCANNER STRENGTH:  3T          PATIENT TYPE:  Outpatient          INCOMPLETE SCAN:  No          REASON(S) FOR SCAN:  Dyspnea          REFERRING PHYSICIAN:  NICOLE STEPHENS  ATTENDING PHYSICIAN:  Earlean Polka, M.D.          TECHNOLOGIST:  Nida Boatman   Cardiac monitoring ([facility]) 11/23/19  Echo stress test 07/02/2019 ([facility]) STRESS ECHOCARDIOGRAPHY                  Drugs: None              Target HR: 156 bpm           Maximum Predicted HR: 184 bpm  +----------------------------------------------------------------------------+  :Stage           Duration                     HR         BP                  :  :   RESTING     :                            :73        :108/68              :   :---------------+----------------------------+----------+---------+          :  :  EXERCISE     :12:00                       :173       :/                   :  :---------------:----------------------------:----------:---------+          :  :  RECOVERY     :3:28                        :99        :114/65              :  :---------------+----------------------------+----------+---------+          :  +----------------------------------------------------------------------------+        Stress Duration: 12: 00 mm: ss        Max Stress H.R.: 173 bpm             Target HR Achieved: Yes  _________________________________________________________________________________________  WALL SEGMENT CHANGES                         Rest                Stress  Anterior Septum Basal: Normal              Hyperkinetic                    Mid: Normal              Hyperkinetic                 Apical: Normal              Hyperkinetic    Anterior Wall Basal: Normal              Hyperkinetic                    Mid: Normal              Hyperkinetic  Apical: Normal              Hyperkinetic     Lateral Wall Basal: Normal              Hyperkinetic                    Mid: Normal              Hyperkinetic                 Apical: Normal              Hyperkinetic   Posterior Wall Basal: Normal              Hyperkinetic                    Mid: Normal              Hyperkinetic    Inferior Wall Basal: Normal              Hyperkinetic                    Mid: Normal              Hyperkinetic                 Apical: Normal              Hyperkinetic  Inferior Septum Basal: Normal              Hyperkinetic                    Mid: Normal              Hyperkinetic             Resting EF: >55% (Est.)                  Stress EF: >55% (Est.)   _________________________________________________________________________________________  ADDITIONAL FINDINGS   _________________________________________________________________________________________  STRESS ECG RESULTS            ECG Results: Normal  _________________________________________________________________________________________  ECHOCARDIOGRAPHIC DESCRIPTIONS  LEFT VENTRICLE                   Size: Normal            Contraction: Normal              LV Masses: No Masses                    LVH: None  RIGHT VENTRICLE                   Size: Normal                       Free Wall: Normal            Contraction: Normal                       RV Masses: No mass  PERICARDIUM                  Fluid: No effusion  _________________________________________________________________________________________   DOPPLER ECHO and OTHER SPECIAL PROCEDURES                 Aortic: No AR  No AS                 Mitral: TRIVIAL MR                 No MS                         MV Inflow E Vel = nm*      MV Annulus E'Vel = nm*                         E/E'Ratio = nm*              Tricuspid: TRIVIAL TR                 No TS                         173.7 cm/sec peak TR vel   15.1 mmHg peak RV pressure              Pulmonary: TRIVIAL PR                 No PS  _________________________________________________________________________________________  ECHOCARDIOGRAPHIC MEASUREMENTS  2D DIMENSIONS  AORTA                  Values   Normal Range   MAIN PA         Values    Normal Range                Annulus: nm*     [2.1-2.5]              PA Main: nm*       [1.5-2.1]              Aorta Sin: nm*     [2.7-3.3]    RIGHT VENTRICLE            ST Junction: nm*     [2.3-2.9]              RV Base: nm*       [<4.2]              Asc.Aorta: nm*     [2.3-3.1]               RV Mid: nm*       [<3.5]  LEFT VENTRICLE                                      RV Length: nm*       [<8.6]                  LVIDd: nm*     [3.9-5.3]    INFERIOR VENA CAVA                  LVIDs: nm*                           Max. IVC:  nm*       [<=2.1]                     FS: nm*     [>25]                 Min. IVC: nm*  SWT: nm*     [0.5-0.9]    ------------------                    PWT: nm*     [0.5-0.9]    nm* - not measured  LEFT ATRIUM                LA Diam: nm*     [2.7-3.8]            LA A4C Area: nm*     [<20]              LA Volume: nm*     [22-52]  _________________________________________________________________________________________  INTERPRETATION  Normal Stress Echocardiogram  NORMAL RIGHT VENTRICULAR SYSTOLIC FUNCTION  TRIVIAL REGURGITATION NOTED (See above)  NO VALVULAR STENOSIS NOTED  Resting EF: >55% (Est.)  Post Stress EF: >55% (Est.)  ECG Results: Normal  Mitral: TRIVIAL MR  Tricuspid: TRIVIAL TR   EKG:  EKG is personally reviewed.   07/20/2021: NSR at 82 bpm  Recent Labs: 03/16/2021: ALT 16; BUN 13; Creatinine, Ser 0.75; Hemoglobin 13.1; Platelets 285.0; Potassium 4.2; Sodium 136; TSH 1.30  Recent Lipid Panel No results found for: CHOL, TRIG, HDL, CHOLHDL, VLDL, LDLCALC, LDLDIRECT  Physical Exam:    VS:  BP 114/60 (BP Location: Left Arm, Patient Position: Sitting, Cuff Size: Normal)   Pulse 82   Ht  (1.651 m)   Wt 160 lb (72.6 kg)   BMI 26.63 kg/m     Wt Readings from Last 3 Encounters:  07/20/21 160 lb (72.6 kg)  06/15/21 162 lb (73.5 kg)  05/23/21 165 lb (74.8 kg)    GEN: Well nourished, well developed in no acute distress HEENT: Normal, moist mucous membranes NECK: No JVD CARDIAC: regular rhythm, normal S1 and S2, no rubs or gallops. No murmur. VASCULAR: Radial and DP pulses 2+ bilaterally. No carotid bruits RESPIRATORY:  Clear to auscultation without rales, wheezing or rhonchi  ABDOMEN: Soft, non-tender, non-distended MUSCULOSKELETAL:  Ambulates independently SKIN: Warm and dry, no edema NEUROLOGIC:  Alert and oriented x 3. No focal neuro deficits noted. PSYCHIATRIC:  Normal affect    ASSESSMENT:    1. Chest pressure   2. SOB (shortness of  breath)   3. Counseling on health promotion and disease prevention   4. Encounter to discuss test results    PLAN:    Chest pain Shortness of breath We spent significant time today reviewing different parts of the cardiovascular system (electrical, vascular, functional, and valvular). We discussed how each of these systems can present with different symptoms. We reviewed that there are different ways we evaluate these symptoms with tests. We reviewed her extensive testing history. Please see summary below. We also discussed that if testing is unrevealing for a cardiac cause of the symptoms, there are many noncardiac causes as well that can contribute to symptoms.  We reviewed: Monitor Echocardiogram Stress echocardiogram Cardiac MRI CPX test PFT  She has also had CT chest and EMG, as well as extensive lab testing. All of her workup to date has been unremarkable. She has seen multiple experts in different specialities at [facility], [facility], [facility], and [facility]. She has seen cardiology, neurology, OB/Gyn, endocrinology, and rheumatology. Despite all of her testing, no clear etiology has been found. She also has seen a psychiatrist and counselor, but these records are not available to me.  We spent >60 minutes face to face today discussing her symptoms, working, and possible etiologies. I discussed with her that  she does not have any cardiac abnormalities on any of her testing, which is reassuring from a health perspective.   We discussed the complex overlap between symptoms and triggers. Discussed that there are many things we don't understand. She will keep a journal of symptoms and activities to see what is linked to good days vs bad days.  I acknowledged that I am not a psychologist or psychiatrist, but the link between chronic sympathetic stimulation/distress and diffuse somatic issues seems to fit her picture. I do not at all believe that her symptoms are fictitious; I believe  she is experiencing what she says she feels. I just wonder if the etiology of these may not be separate physical illnesses but rather one unifying syndrome.   She is not currently seeing a psychiatrist but has been on sertraline for some time. I question whether biofeedback, relaxation training, or other methods might be helpful. We also discussed vagal tone today, and she feels better with walking but worse with high intensity exercise. We discussed gradually increasing her activity level to see if this improves her symptoms.  Overall this is very distressing for her. I reassured her that the evaluation I have reviewed thus far is reassuring, and we have time to try to sort out what next steps might benefit her.  Total time of encounter: 88 minutes total time of encounter, including 64 minutes spent in face-to-face patient care. This time includes coordination of care and counseling regarding findings to date. Remainder of non-face-to-face time involved documentation. Separate time reviewing chart documents/testing relevant to the patient encounter and documentation in the medical record is included in a separate charge below.  Documentation of Prolonged Non Face-To-Face Time (> 31 minutes) I personally reviewed prior medical records (both internal and external) for this encounter.  This included review of historical hospital records, office notes, cardiac studies (e.g. Echocardiograms, Stress Tests, Cardiac Catheterizations, Event Monitors, etc), radiologic studies (e.g. MRIs, CTs, Xrays, etc), laboratory data, etc.  The pertinent findings are outlined in my note.  The total non face to face time spent for record review was 40 minutes.  This does not include the review of my own personal notes and records.   Plan for follow up: 3 months or sooner as needed.  Jodelle Red, MD, PhD, [facility] [place]  [facility]    Medication Adjustments/Labs and Tests Ordered: Current medicines are  reviewed at length with the patient today.  Concerns regarding medicines are outlined above.  Orders Placed This Encounter  Procedures   EKG 12-Lead    No orders of the defined types were placed in this encounter.   Patient Instructions  Medication Instructions:  Your physician recommends that you continue on your current medications as directed. Please refer to the Current Medication list given to you today.  Follow-Up: At [facility], you and your health needs are our priority.  As part of our continuing mission to provide you with exceptional heart care, we have created designated Provider Care Teams.  These Care Teams include your primary Cardiologist (physician) and Advanced Practice Providers (APPs -  Physician Assistants and Nurse Practitioners) who all work together to provide you with the care you need, when you need it.  We recommend signing up for the patient portal called "MyChart".  Sign up information is provided on this After Visit Summary.  MyChart is used to connect with patients for Virtual Visits (Telemedicine).  Patients are able to view lab/test results, encounter notes, upcoming appointments, etc.  Non-urgent messages can be sent to your provider as well.   To learn more about what you can do with MyChart, go to [email or web address].    Your next appointment:   3 month(s)  The format for your next appointment:   In Person  Provider:   Jodelle Red, MD   Look into getting a Kardia Mobile to check your heart rhythm   I,Mathew Stumpf,acting as a scribe for Jodelle Red, MD.,have documented all relevant documentation on the behalf of Jodelle Red, MD,as directed by  Jodelle Red, MD while in the presence of Jodelle Red, MD.  I, Jodelle Red, MD, have reviewed all documentation for this visit. The documentation on 07/20/21 for the exam, diagnosis, procedures, and orders are all accurate and complete.    Signed, Jodelle Red, MD PhD 07/20/2021 6:47 PM    [facility]

## 2021-07-20 NOTE — Patient Instructions (Signed)
Medication Instructions:  Your physician recommends that you continue on your current medications as directed. Please refer to the Current Medication list given to you today.  Follow-Up: At [facility], you and your health needs are our priority.  As part of our continuing mission to provide you with exceptional heart care, we have created designated Provider Care Teams.  These Care Teams include your primary Cardiologist (physician) and Advanced Practice Providers (APPs -  Physician Assistants and Nurse Practitioners) who all work together to provide you with the care you need, when you need it.  We recommend signing up for the patient portal called "MyChart".  Sign up information is provided on this After Visit Summary.  MyChart is used to connect with patients for Virtual Visits (Telemedicine).  Patients are able to view lab/test results, encounter notes, upcoming appointments, etc.  Non-urgent messages can be sent to your provider as well.   To learn more about what you can do with MyChart, go to [email or web address].    Your next appointment:   3 month(s)  The format for your next appointment:   In Person  Provider:   Jodelle Red, MD   Look into getting a Kardia Mobile to check your heart rhythm

## 2021-07-21 ENCOUNTER — Encounter: Payer: Self-pay | Admitting: Internal Medicine

## 2021-07-21 ENCOUNTER — Encounter (HOSPITAL_BASED_OUTPATIENT_CLINIC_OR_DEPARTMENT_OTHER): Payer: Self-pay

## 2021-07-21 NOTE — Telephone Encounter (Signed)
Please call and notify her that I did receive information from cardiology.  Reviewed her my chart message. She has an appt with me in October.  Let us know if she feels like she needs anything prior to appt

## 2021-07-25 NOTE — Telephone Encounter (Signed)
Patient aware of below. She is ok with waiting until her appt in Oct. Patient will let us know if she needs something sooner

## 2021-08-03 ENCOUNTER — Encounter: Payer: Self-pay | Admitting: Internal Medicine

## 2021-08-03 ENCOUNTER — Other Ambulatory Visit: Payer: Self-pay

## 2021-08-03 ENCOUNTER — Ambulatory Visit (INDEPENDENT_AMBULATORY_CARE_PROVIDER_SITE_OTHER): Payer: Self-pay | Admitting: Internal Medicine

## 2021-08-03 DIAGNOSIS — R253 Fasciculation: Secondary | ICD-10-CM

## 2021-08-03 DIAGNOSIS — R0602 Shortness of breath: Secondary | ICD-10-CM

## 2021-08-03 DIAGNOSIS — I8393 Asymptomatic varicose veins of bilateral lower extremities: Secondary | ICD-10-CM

## 2021-08-03 DIAGNOSIS — R531 Weakness: Secondary | ICD-10-CM

## 2021-08-03 DIAGNOSIS — F419 Anxiety disorder, unspecified: Secondary | ICD-10-CM

## 2021-08-03 DIAGNOSIS — J4530 Mild persistent asthma, uncomplicated: Secondary | ICD-10-CM

## 2021-08-03 NOTE — Progress Notes (Signed)
Patient ID: Tonya Harris, female   DOB: Jul 06, 1981, 40 y.o.   MRN: [id number]   Subjective:    Patient ID: Tonya Harris, female    DOB: 07-25-1981, 40 y.o.   MRN: [id number]  This visit occurred during the SARS-CoV-2 public health emergency.  Safety protocols were in place, including screening questions prior to the visit, additional usage of staff PPE, and extensive cleaning of exam room while observing appropriate contact time as indicated for disinfecting solutions.   Patient here for a scheduled follow up.   Chief Complaint  Patient presents with   Anxiety   Fatigue   .   HPI Has had issues with chest pain - initial onset 2019.  Some weakness and feels some days better than others.  Occasionally will feel like she is going to pass out.  Occasionally will feel like her heart is beating out of her chest.  Sometimes feels needs to rest.  States she feels at times like her hear is "failing".  Used to be very physically active.  Played tennis and ran.  Frustrated now that she cannot be as active.  Feels some relief when lying down. States feels sob when stands up and starts moving around.  Feels better lying down.  Eating.  Trying to stay hydrated.  No vomiting.  No abdominal pain.  Bowels moving.  Seeing eye MD tomorrow.     Past Medical History:  Diagnosis Date   Gestational diabetes    Past Surgical History:  Procedure Laterality Date   APPENDECTOMY     Family History  Problem Relation Age of Onset   Hypertension Mother    Hypertension Father    Social History   Socioeconomic History   Marital status: Married    Spouse name: Not on file   Number of children: 4   Years of education: Not on file   Highest education level: Master's degree (e.g., MA, MS, MEng, MEd, MSW, MBA)  Occupational History   Occupation: Runner, broadcasting/film/video   Occupation: Stay at home mom  Tobacco Use   Smoking status: Never   Smokeless tobacco: Never  Vaping Use   Vaping Use: Never used  Substance and  Sexual Activity   Alcohol use: Not Currently   Drug use: Not Currently   Sexual activity: Yes    Partners: Male  Other Topics Concern   Not on file  Social History Narrative   Right handed      Master's degree in teaching       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 Negative for appetite change and unexpected weight change.  HENT:  Negative for congestion and sinus pressure.   Respiratory:  Negative for cough and chest tightness.        Some sob with exertion.   Cardiovascular:  Negative for leg swelling.       Increased heart rate as outlined.   Gastrointestinal:  Negative for abdominal pain, diarrhea and vomiting.  Genitourinary:  Negative for difficulty urinating and dysuria.  Musculoskeletal:  Negative for myalgias.  Skin:  Negative for color change and rash.  Neurological:  Negative for dizziness and headaches.  Psychiatric/Behavioral:  Negative for  agitation and dysphoric mood.        Increased stress as outlined.       Objective:     BP 112/70   Pulse 76   Temp 97.9 F (36.6 C)   Resp 16   Ht 5\' 5"  (1.651 m)   Wt 162 lb (73.5 kg)   SpO2 99%   BMI 26.96 kg/m  Wt Readings from Last 3 Encounters:  08/03/21 162 lb (73.5 kg)  07/20/21 160 lb (72.6 kg)  06/15/21 162 lb (73.5 kg)    Physical Exam Vitals reviewed.  Constitutional:      General: She is not in acute distress.    Appearance: Normal appearance.  HENT:     Head: Normocephalic and atraumatic.     Right Ear: External ear normal.     Left Ear: External ear normal.  Eyes:     General: No scleral icterus.       Right eye: No discharge.        Left eye: No discharge.     Conjunctiva/sclera: Conjunctivae  normal.  Neck:     Thyroid: No thyromegaly.  Cardiovascular:     Rate and Rhythm: Normal rate and regular rhythm.  Pulmonary:     Effort: No respiratory distress.     Breath sounds: Normal breath sounds. No wheezing.  Abdominal:     General: Bowel sounds are normal.     Palpations: Abdomen is soft.     Tenderness: There is no abdominal tenderness.  Musculoskeletal:        General: No swelling or tenderness.     Cervical back: Neck supple. No tenderness.  Lymphadenopathy:     Cervical: No cervical adenopathy.  Skin:    Findings: No erythema or rash.  Neurological:     Mental Status: She is alert.  Psychiatric:        Mood and Affect: Mood normal.        Behavior: Behavior normal.     Outpatient Encounter Medications as of 08/03/2021  Medication Sig   magnesium oxide (MAG-OX) 400 (240 Mg) MG tablet Take 1 tablet (400 mg total) by mouth daily.   Multiple Vitamin (MULTIVITAMIN) tablet Take 1 tablet by mouth daily.   pantoprazole (PROTONIX) 40 MG tablet TAKE 1 TABLET BY MOUTH EVERY DAY   sertraline (ZOLOFT) 100 MG tablet Take 100 mg by mouth daily.   No facility-administered encounter medications on file as of 08/03/2021.     Lab Results  Component Value Date   WBC 5.9 03/16/2021   HGB 13.1 03/16/2021   HCT 38.5 03/16/2021   PLT 285.0 03/16/2021   GLUCOSE 90 03/16/2021   ALT 16 03/16/2021   AST 19 03/16/2021   NA 136 03/16/2021   K 4.2 03/16/2021   CL 103 03/16/2021   CREATININE 0.75 03/16/2021   BUN 13 03/16/2021   CO2 24 03/16/2021   TSH 1.30 03/16/2021    VAS 05/16/2021 LOWER EXTREMITY VENOUS REFLUX  Result Date: 05/23/2021  Lower Venous Reflux Study Patient Name:  Tonya Harris [facility]  Date of Exam:   05/23/2021 Medical Rec #: [id number]          Accession #:    [id number] Date of Birth: 1981/08/05          Patient Gender: F Patient Age:   40Y Exam Location:  05-31-2001 Vascular Imaging Procedure:      VAS Rudene Anda LOWER EXTREMITY VENOUS REFLUX Referring Phys: [id number] CHRISTOPHER S  DICKSON --------------------------------------------------------------------------------  Indications: Varicosities.  Performing Technologist: Jeb Levering RDMS, RVT  Examination Guidelines: A complete evaluation includes B-mode imaging, spectral Doppler, color Doppler, and power Doppler as needed of all accessible portions of each vessel. Bilateral testing is considered an integral part of a complete examination. Limited examinations for reoccurring indications may be performed as noted. The reflux portion of the exam is performed with the patient in reverse Trendelenburg. Significant venous reflux is defined as >500 ms in the superficial venous system, and >1 second in the deep venous system.  Venous Reflux Times +--------------+---------+------+-----------+------------+--------+ RIGHT         Reflux NoRefluxReflux TimeDiameter cmsComments                         Yes                                  +--------------+---------+------+-----------+------------+--------+ CFV                     yes   >1 second                      +--------------+---------+------+-----------+------------+--------+ FV mid        no                                             +--------------+---------+------+-----------+------------+--------+ Popliteal               yes   >1 second                      +--------------+---------+------+-----------+------------+--------+ GSV at SFJ              yes    >500 ms      0.67             +--------------+---------+------+-----------+------------+--------+ GSV prox thighno                            0.35             +--------------+---------+------+-----------+------------+--------+ GSV mid thigh           yes    >500 ms      0.23             +--------------+---------+------+-----------+------------+--------+ GSV dist thigh          yes    >500 ms      0.23             +--------------+---------+------+-----------+------------+--------+  GSV at knee             yes    >500 ms      0.67             +--------------+---------+------+-----------+------------+--------+ GSV prox calf           yes    >500 ms      0.27             +--------------+---------+------+-----------+------------+--------+ SSV Pop Fossa no                            0.28             +--------------+---------+------+-----------+------------+--------+ SSV prox calf no  0.3              +--------------+---------+------+-----------+------------+--------+ SSV mid calf  no                            0.32             +--------------+---------+------+-----------+------------+--------+ AASV Prox               yes    >500 ms      0.31             +--------------+---------+------+-----------+------------+--------+ AASV Mid                yes    >500 ms      0.29             +--------------+---------+------+-----------+------------+--------+  +--------------+---------+------+-----------+------------+--------+ LEFT          Reflux NoRefluxReflux TimeDiameter cmsComments                         Yes                                  +--------------+---------+------+-----------+------------+--------+ CFV                     yes   >1 second                      +--------------+---------+------+-----------+------------+--------+ FV mid                  yes   >1 second                      +--------------+---------+------+-----------+------------+--------+ Popliteal     no                                             +--------------+---------+------+-----------+------------+--------+ GSV at SFJ              yes    >500 ms      0.57             +--------------+---------+------+-----------+------------+--------+ GSV prox thigh          yes    >500 ms      0.39             +--------------+---------+------+-----------+------------+--------+ GSV mid thigh           yes    >500 ms       0.37             +--------------+---------+------+-----------+------------+--------+ GSV dist thigh          yes    >500 ms      0.63             +--------------+---------+------+-----------+------------+--------+ GSV at knee             yes    >500 ms      0.58             +--------------+---------+------+-----------+------------+--------+ GSV prox calf           yes    >500 ms      0.44             +--------------+---------+------+-----------+------------+--------+ SSV Pop Fossa no  0.18             +--------------+---------+------+-----------+------------+--------+ SSV prox calf no                            0.3              +--------------+---------+------+-----------+------------+--------+ SSV mid calf  no                            0.22             +--------------+---------+------+-----------+------------+--------+   Summary: Right: - No evidence of deep vein thrombosis seen in the right lower extremity, from the common femoral through the popliteal veins. - No evidence of superficial venous thrombosis in the right lower extremity. - Venous reflux is noted in the right common femoral vein. - Venous reflux is noted in the right sapheno-femoral junction. - Venous reflux is noted in the right greater saphenous vein in the thigh. - Venous reflux is noted in the right greater saphenous vein in the calf. - Venous reflux is noted in the right popliteal vein. - Venous reflux is noted in the right AASV.  Left: - No evidence of deep vein thrombosis seen in the left lower extremity, from the common femoral through the popliteal veins. - No evidence of superficial venous thrombosis in the left lower extremity. - Venous reflux is noted in the left common femoral vein. - Venous reflux is noted in the left sapheno-femoral junction. - Venous reflux is noted in the left greater saphenous vein in the thigh. - Venous reflux is noted in the left greater  saphenous vein in the calf. - Venous reflux is noted in the left femoral vein.  *See table(s) above for measurements and observations. Electronically signed by Fabienne Bruns MD on 05/23/2021 at 7:41:19 PM.    Final        Assessment & Plan:   Problem List Items Addressed This Visit     Anxiety    Followed by Dr Maryruth Bun.  Continues zoloft.       Asthma, mild persistent    Intolerant to trelegy.  Breathing overall stable.  Has seen pulmonary.  Follow.        Muscle twitching    Has been diagnosed with benign fasciculation syndrome.  Has seen neurology.  Stable.      SOB (shortness of breath)    Has seen pulmonary.  Breathing overall stable.       Varicose veins of both lower extremities    Continue support hose.        Weakness    Has seen neurology, rheumatology and cardiology.  Also pulmonary evaluation.  Reports has noticed symptoms are worse when standing - when she is upright or moving around.  Discussed.  Request f/u with EP.          Dale H. Rivera Colon, MD

## 2021-08-04 ENCOUNTER — Encounter: Payer: Self-pay | Admitting: Internal Medicine

## 2021-08-07 ENCOUNTER — Encounter: Payer: Self-pay | Admitting: Internal Medicine

## 2021-08-07 NOTE — Assessment & Plan Note (Signed)
Has seen neurology, rheumatology and cardiology.  Also pulmonary evaluation.  Reports has noticed symptoms are worse when standing - when she is upright or moving around.  Discussed.  Request f/u with EP.

## 2021-08-07 NOTE — Assessment & Plan Note (Signed)
Followed by Dr Maryruth Bun.  Continues zoloft.

## 2021-08-07 NOTE — Assessment & Plan Note (Signed)
Has seen pulmonary.  Breathing overall stable.

## 2021-08-07 NOTE — Assessment & Plan Note (Signed)
Intolerant to trelegy.  Breathing overall stable.  Has seen pulmonary.  Follow.

## 2021-08-07 NOTE — Assessment & Plan Note (Signed)
Has been diagnosed with benign fasciculation syndrome.  Has seen neurology.  Stable.  

## 2021-08-07 NOTE — Assessment & Plan Note (Signed)
Continue support hose

## 2021-08-10 ENCOUNTER — Ambulatory Visit: Payer: Self-pay | Admitting: Internal Medicine

## 2021-08-11 NOTE — Telephone Encounter (Signed)
Patient calling in to check on status of EP referral. Please advise    Call was disconnected while informing Patient that I would send message back

## 2021-08-19 ENCOUNTER — Encounter: Payer: Self-pay | Admitting: Internal Medicine

## 2021-08-19 ENCOUNTER — Ambulatory Visit: Payer: Self-pay | Admitting: Internal Medicine

## 2021-08-22 ENCOUNTER — Ambulatory Visit: Payer: Self-pay | Admitting: Internal Medicine

## 2021-08-25 ENCOUNTER — Encounter (HOSPITAL_BASED_OUTPATIENT_CLINIC_OR_DEPARTMENT_OTHER): Payer: Self-pay

## 2021-08-25 DIAGNOSIS — G90A Postural orthostatic tachycardia syndrome (POTS): Secondary | ICD-10-CM

## 2021-08-26 ENCOUNTER — Encounter: Payer: Self-pay | Admitting: Internal Medicine

## 2021-08-26 ENCOUNTER — Other Ambulatory Visit: Payer: Self-pay

## 2021-08-26 ENCOUNTER — Ambulatory Visit (INDEPENDENT_AMBULATORY_CARE_PROVIDER_SITE_OTHER): Payer: Self-pay | Admitting: Internal Medicine

## 2021-08-26 VITALS — BP 118/70 | HR 84 | Temp 97.6°F | Resp 16 | Ht 65.0 in | Wt 161.0 lb

## 2021-08-26 DIAGNOSIS — R531 Weakness: Secondary | ICD-10-CM

## 2021-08-26 DIAGNOSIS — R002 Palpitations: Secondary | ICD-10-CM

## 2021-08-26 DIAGNOSIS — R253 Fasciculation: Secondary | ICD-10-CM

## 2021-08-26 DIAGNOSIS — J4530 Mild persistent asthma, uncomplicated: Secondary | ICD-10-CM

## 2021-08-26 DIAGNOSIS — F419 Anxiety disorder, unspecified: Secondary | ICD-10-CM

## 2021-08-26 DIAGNOSIS — R0602 Shortness of breath: Secondary | ICD-10-CM

## 2021-08-26 DIAGNOSIS — R5383 Other fatigue: Secondary | ICD-10-CM

## 2021-08-26 DIAGNOSIS — Z23 Encounter for immunization: Secondary | ICD-10-CM

## 2021-08-26 NOTE — Progress Notes (Signed)
Patient ID: Tonya Harris, female   DOB: 1980-11-21, 40 y.o.   MRN: [id number]   Subjective:    Patient ID: Tonya Harris, female    DOB: October 17, 1981, 40 y.o.   MRN: [id number]  This visit occurred during the SARS-CoV-2 public health emergency.  Safety protocols were in place, including screening questions prior to the visit, additional usage of staff PPE, and extensive cleaning of exam room while observing appropriate contact time as indicated for disinfecting solutions.   Patient here for a scheduled follow up .   HPI Here to follow up regarding "sensation with her heart".  Also will have intermittent episodes where she feels like she could pass out. (Weak feelings).  Saw cardiology 07/20/21.  Note reviewed.  Frustrated that she cannot do the things she previously could do.  Is walking.  She feels a "jolt" in her heart.  Questioning seeing EP for f/u.  Eating.  No vomiting.  No abdominal pain.  Bowels moving.  Increased stress and anxiety related to above.  Feels some increased stress recently (more).  Sees Dr Maryruth Bun. On zoloft.  Feels may need a little something more to help with anxiety.  Also has noticed floaters.  Light bothers her eyes.  Some eye twitching. Seeing an ophthalmologist.     Past Medical History:  Diagnosis Date   Gestational diabetes    Past Surgical History:  Procedure Laterality Date   APPENDECTOMY     Family History  Problem Relation Age of Onset   Hypertension Mother    Hypertension Father    Social History   Socioeconomic History   Marital status: Married    Spouse name: Not on file   Number of children: 4   Years of education: Not on file   Highest education level: Master's degree (e.g., MA, MS, MEng, MEd, MSW, MBA)  Occupational History   Occupation: Runner, broadcasting/film/video   Occupation: Stay at home mom  Tobacco Use   Smoking status: Never   Smokeless tobacco: Never  Vaping Use   Vaping Use: Never used  Substance and Sexual Activity   Alcohol use: Not  Currently   Drug use: Not Currently   Sexual activity: Yes    Partners: Male  Other Topics Concern   Not on file  Social History Narrative   Right handed      Master's degree in teaching       Stay at home mom of 4 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  HENT:  Negative for congestion and sinus pressure.   Respiratory:  Negative for cough and chest tightness.        Describes some sob with exertion.    Cardiovascular:  Negative for leg swelling.       Some chest discomfort at times.  Noticed increased heart rate - intermittent.   Gastrointestinal:  Negative for abdominal pain, diarrhea, nausea and vomiting.  Genitourinary:  Negative for difficulty urinating and dysuria.  Musculoskeletal:  Negative for joint swelling and myalgias.  Skin:  Negative for color change and rash.  Neurological:  Negative for dizziness and headaches.  Psychiatric/Behavioral:         Increased stress and  anxiety as outlined.        Objective:     BP 118/70   Pulse 84   Temp 97.6 F (36.4 C)   Resp 16   Ht 5\' 5"  (1.651 m)   Wt 161 lb (73 kg)   SpO2 99%   BMI 26.79 kg/m  Wt Readings from Last 3 Encounters:  08/26/21 161 lb (73 kg)  08/03/21 162 lb (73.5 kg)  07/20/21 160 lb (72.6 kg)    Physical Exam Vitals reviewed.  Constitutional:      General: She is not in acute distress.    Appearance: Normal appearance.  HENT:     Head: Normocephalic and atraumatic.     Right Ear: External ear normal.     Left Ear: External ear normal.  Eyes:     General: No scleral icterus.       Right eye: No discharge.        Left eye: No discharge.     Conjunctiva/sclera: Conjunctivae normal.   Neck:     Thyroid: No thyromegaly.  Cardiovascular:     Rate and Rhythm: Normal rate and regular rhythm.  Pulmonary:     Effort: No respiratory distress.     Breath sounds: Normal breath sounds. No wheezing.  Abdominal:     General: Bowel sounds are normal.     Palpations: Abdomen is soft.     Tenderness: There is no abdominal tenderness.  Musculoskeletal:        General: No swelling or tenderness.     Cervical back: Neck supple. No tenderness.  Lymphadenopathy:     Cervical: No cervical adenopathy.  Skin:    Findings: No erythema or rash.  Neurological:     Mental Status: She is alert.  Psychiatric:        Mood and Affect: Mood normal.        Behavior: Behavior normal.     Outpatient Encounter Medications as of 08/26/2021  Medication Sig   magnesium oxide (MAG-OX) 400 (240 Mg) MG tablet Take 1 tablet (400 mg total) by mouth daily.   Multiple Vitamin (MULTIVITAMIN) tablet Take 1 tablet by mouth daily.   pantoprazole (PROTONIX) 40 MG tablet TAKE 1 TABLET BY MOUTH EVERY DAY   sertraline (ZOLOFT) 100 MG tablet Take 100 mg by mouth daily.   No facility-administered encounter medications on file as of 08/26/2021.     Lab Results  Component Value Date   WBC 5.9 03/16/2021   HGB 13.1 03/16/2021   HCT 38.5 03/16/2021   PLT 285.0 03/16/2021   GLUCOSE 90 03/16/2021   ALT 16 03/16/2021   AST 19 03/16/2021   NA 136 03/16/2021   K 4.2 03/16/2021   CL 103 03/16/2021   CREATININE 0.75 03/16/2021   BUN 13 03/16/2021   CO2 24 03/16/2021   TSH 1.30 03/16/2021    VAS 05/16/2021 LOWER EXTREMITY VENOUS REFLUX  Result Date: 05/23/2021  Lower Venous Reflux Study Patient Name:  Tonya Harris [facility]  Date of Exam:   05/23/2021 Medical Rec #: [id number]/2021          Accession #:    [id number] Date of Birth: January 24, 1981          Patient Gender: F Patient Age:   40Y Exam Location:  05-31-2001 Vascular Imaging Procedure:      VAS Tonya Harris LOWER EXTREMITY VENOUS REFLUX Referring Phys: [id number] CHRISTOPHER S  DICKSON --------------------------------------------------------------------------------  Indications: Varicosities.  Performing Technologist: [place] RDMS, RVT  Examination Guidelines: A  complete evaluation includes B-mode imaging, spectral Doppler, color Doppler, and power Doppler as needed of all accessible portions of each vessel. Bilateral testing is considered an integral part of a complete examination. Limited examinations for reoccurring indications may be performed as noted. The reflux portion of the exam is performed with the patient in reverse Trendelenburg. Significant venous reflux is defined as >500 ms in the superficial venous system, and >1 second in the deep venous system.  Venous Reflux Times +--------------+---------+------+-----------+------------+--------+ RIGHT         Reflux NoRefluxReflux TimeDiameter cmsComments                         Yes                                  +--------------+---------+------+-----------+------------+--------+ CFV                     yes   >1 second                      +--------------+---------+------+-----------+------------+--------+ FV mid        no                                             +--------------+---------+------+-----------+------------+--------+ Popliteal               yes   >1 second                      +--------------+---------+------+-----------+------------+--------+ GSV at SFJ              yes    >500 ms      0.67             +--------------+---------+------+-----------+------------+--------+ GSV prox thighno                            0.35             +--------------+---------+------+-----------+------------+--------+ GSV mid thigh           yes    >500 ms      0.23             +--------------+---------+------+-----------+------------+--------+ GSV dist thigh          yes    >500 ms      0.23             +--------------+---------+------+-----------+------------+--------+  GSV at knee             yes    >500 ms      0.67             +--------------+---------+------+-----------+------------+--------+ GSV prox calf           yes    >500 ms      0.27             +--------------+---------+------+-----------+------------+--------+ SSV Pop Fossa no                            0.28             +--------------+---------+------+-----------+------------+--------+ SSV prox calf no  0.3              +--------------+---------+------+-----------+------------+--------+ SSV mid calf  no                            0.32             +--------------+---------+------+-----------+------------+--------+ AASV Prox               yes    >500 ms      0.31             +--------------+---------+------+-----------+------------+--------+ AASV Mid                yes    >500 ms      0.29             +--------------+---------+------+-----------+------------+--------+  +--------------+---------+------+-----------+------------+--------+ LEFT          Reflux NoRefluxReflux TimeDiameter cmsComments                         Yes                                  +--------------+---------+------+-----------+------------+--------+ CFV                     yes   >1 second                      +--------------+---------+------+-----------+------------+--------+ FV mid                  yes   >1 second                      +--------------+---------+------+-----------+------------+--------+ Popliteal     no                                             +--------------+---------+------+-----------+------------+--------+ GSV at SFJ              yes    >500 ms      0.57             +--------------+---------+------+-----------+------------+--------+ GSV prox thigh          yes    >500 ms      0.39             +--------------+---------+------+-----------+------------+--------+ GSV mid thigh           yes    >500 ms       0.37             +--------------+---------+------+-----------+------------+--------+ GSV dist thigh          yes    >500 ms      0.63             +--------------+---------+------+-----------+------------+--------+ GSV at knee             yes    >500 ms      0.58             +--------------+---------+------+-----------+------------+--------+ GSV prox calf           yes    >500 ms      0.44             +--------------+---------+------+-----------+------------+--------+ SSV Pop Fossa no  0.18             +--------------+---------+------+-----------+------------+--------+ SSV prox calf no                            0.3              +--------------+---------+------+-----------+------------+--------+ SSV mid calf  no                            0.22             +--------------+---------+------+-----------+------------+--------+   Summary: Right: - No evidence of deep vein thrombosis seen in the right lower extremity, from the common femoral through the popliteal veins. - No evidence of superficial venous thrombosis in the right lower extremity. - Venous reflux is noted in the right common femoral vein. - Venous reflux is noted in the right sapheno-femoral junction. - Venous reflux is noted in the right greater saphenous vein in the thigh. - Venous reflux is noted in the right greater saphenous vein in the calf. - Venous reflux is noted in the right popliteal vein. - Venous reflux is noted in the right AASV.  Left: - No evidence of deep vein thrombosis seen in the left lower extremity, from the common femoral through the popliteal veins. - No evidence of superficial venous thrombosis in the left lower extremity. - Venous reflux is noted in the left common femoral vein. - Venous reflux is noted in the left sapheno-femoral junction. - Venous reflux is noted in the left greater saphenous vein in the thigh. - Venous reflux is noted in the left greater  saphenous vein in the calf. - Venous reflux is noted in the left femoral vein.  *See table(s) above for measurements and observations. Electronically signed by Fabienne Bruns MD on 05/23/2021 at 7:41:19 PM.    Final        Assessment & Plan:   Problem List Items Addressed This Visit     Anxiety    Continues on zoloft.  Followed by Dr Maryruth Bun.  Feels may need a little something to have if needed - to help with anxiety.  D/w her psychiatrist regarding recommendation.       Asthma, mild persistent    Breathing overall appears to be stable.  Intolerance to trelegy.       Fatigue    Has had extensive work-up as outlined previously.  Continue compression hose and continue to stay hydrated.       Muscle twitching    Has been diagnosed with benign fasciculation syndrome.  Evaluated by neurology.  Follow.        Palpitations    Increased heart rate and palpitations.  Discussed anxiety.  This sensation worsens her anxiety.  Previous holter - sinus tachycardia.  No significant arrhythmia.  Request f/u with EP.       SOB (shortness of breath)    Has seen pulmonary.  Overall breathing appears to be stable.       Weakness    Has seen neurology, rheumatology and cardiology.  Also pulmonary evaluation.  Reports has noticed symptoms are worse when standing - when she is upright or moving around.  Discussed.  Request f/u with EP.  Discussed with cardiology - request recommendation.       Other Visit Diagnoses     Need for immunization against influenza    -  Primary   Relevant Orders  Flu Vaccine QUAD 7moIM (Fluarix, Fluzone & Alfiuria Quad PF) (Completed)        CEinar Pheasant MD

## 2021-08-27 ENCOUNTER — Encounter: Payer: Self-pay | Admitting: Internal Medicine

## 2021-08-27 NOTE — Assessment & Plan Note (Signed)
Breathing overall appears to be stable.  Intolerance to trelegy.

## 2021-08-27 NOTE — Assessment & Plan Note (Signed)
Has been diagnosed with benign fasciculation syndrome.  Evaluated by neurology.  Follow.   

## 2021-08-27 NOTE — Assessment & Plan Note (Signed)
Continues on zoloft.  Followed by Dr Maryruth Bun.  Feels may need a little something to have if needed - to help with anxiety.  D/w her psychiatrist regarding recommendation.

## 2021-08-27 NOTE — Assessment & Plan Note (Signed)
Increased heart rate and palpitations.  Discussed anxiety.  This sensation worsens her anxiety.  Previous holter - sinus tachycardia.  No significant arrhythmia.  Request f/u with EP.

## 2021-08-27 NOTE — Assessment & Plan Note (Signed)
Has seen pulmonary.  Overall breathing appears to be stable.

## 2021-08-27 NOTE — Assessment & Plan Note (Signed)
Has had extensive work-up as outlined previously.  Continue compression hose and continue to stay hydrated.  

## 2021-08-27 NOTE — Assessment & Plan Note (Addendum)
Has seen neurology, rheumatology and cardiology.  Also pulmonary evaluation.  Reports has noticed symptoms are worse when standing - when she is upright or moving around.  Discussed.  Request f/u with EP.  Discussed with cardiology - request recommendation.

## 2021-09-08 ENCOUNTER — Ambulatory Visit: Payer: Self-pay | Admitting: Cardiovascular Disease

## 2021-09-13 ENCOUNTER — Ambulatory Visit (HOSPITAL_BASED_OUTPATIENT_CLINIC_OR_DEPARTMENT_OTHER): Payer: Self-pay | Admitting: Cardiology

## 2021-09-21 ENCOUNTER — Ambulatory Visit (INDEPENDENT_AMBULATORY_CARE_PROVIDER_SITE_OTHER): Payer: Self-pay | Admitting: Cardiology

## 2021-09-21 ENCOUNTER — Encounter (HOSPITAL_BASED_OUTPATIENT_CLINIC_OR_DEPARTMENT_OTHER): Payer: Self-pay | Admitting: Cardiology

## 2021-09-21 ENCOUNTER — Other Ambulatory Visit: Payer: Self-pay

## 2021-09-21 VITALS — BP 100/80 | HR 79 | Ht 65.0 in | Wt 161.0 lb

## 2021-09-21 DIAGNOSIS — G908 Other disorders of autonomic nervous system: Secondary | ICD-10-CM

## 2021-09-21 DIAGNOSIS — R0602 Shortness of breath: Secondary | ICD-10-CM

## 2021-09-21 DIAGNOSIS — Z7189 Other specified counseling: Secondary | ICD-10-CM

## 2021-09-21 DIAGNOSIS — R000 Tachycardia, unspecified: Secondary | ICD-10-CM

## 2021-09-21 NOTE — Patient Instructions (Signed)
Medication Instructions:  No changes today *If you need a refill on your cardiac medications before your next appointment, please call your pharmacy*   Lab Work: None today If you have labs (blood work) drawn today and your tests are completely normal, you will receive your results only by: MyChart Message (if you have MyChart) OR A paper copy in the mail If you have any lab test that is abnormal or we need to change your treatment, we will call you to review the results.   Testing/Procedures: No new   Follow-Up: At [facility], you and your health needs are our priority.  As part of our continuing mission to provide you with exceptional heart care, we have created designated Provider Care Teams.  These Care Teams include your primary Cardiologist (physician) and Advanced Practice Providers (APPs -  Physician Assistants and Nurse Practitioners) who all work together to provide you with the care you need, when you need it.  We recommend signing up for the patient portal called "MyChart".  Sign up information is provided on this After Visit Summary.  MyChart is used to connect with patients for Virtual Visits (Telemedicine).  Patients are able to view lab/test results, encounter notes, upcoming appointments, etc.  Non-urgent messages can be sent to your provider as well.   To learn more about what you can do with MyChart, go to [email or web address].    Your next appointment:   To be determined, based on results of visit with Dr. Graciela Husbands  The format for your next appointment:   Either in person or virtual  Provider:   Jodelle Red, MD

## 2021-09-21 NOTE — Progress Notes (Signed)
Cardiology Office Note:    Date:  09/21/2021   ID:  Tonya Harris, DOB [birth year], MRN [id number]  PCP:  Einar Pheasant, MD  Cardiologist:  Buford Dresser, MD  Referring MD: Einar Pheasant, MD   CC: follow up    History of Present Illness:    Tonya Harris is a 40 y.o. female with a hx of gestational diabetes, Raynaud's who is seen for follow up today. I initially met her 07/20/21 as a new consult at the request of Einar Pheasant, MD for the evaluation and management of chest pain.  See extensive history in consult note 07/20/21. Tests include monitor, CPX, stress echo, cardiac MRI, venous reflux studies, and PFTs. She has also had CT chest and EMG, as well as extensive lab testing. All of her workup to date has been unremarkable. She has seen multiple experts in different specialities at [facility], [facility], [facility], and [facility]. She has seen cardiology, neurology, OB/Gyn, endocrinology, and rheumatology. Despite all of her testing, no clear etiology has been found. She also has seen a psychiatrist and counselor, but these records are not available to me.  Today: Has felt very bad recently. Feels faint, feels like she can't take a deep breath. Feels generally weak. No syncope. Continues to have muscle aches and stiffness. Wonders if it a nerve issue. Has asked to be evaluated by an electrophysiologist, appt scheduled with Dr. Caryl Comes in January 2023.  Notes Raynaud's has been worse. Feels like something is pounding in her chest, but heart rhythm is normal when she checks on her device.   We discussed at length what her goals are. Overall she wants to feel better, but she understands the limitations. Feels like there is something "big" driving all of this that hasn't been discovered yet. Feels like this is something neurological. Has pictures of her Raynaud's and videos of her fasciculations.  This is a big change from her baseline from years ago. She feels like her  anxiety is well controlled.    Past Medical History:  Diagnosis Date   Gestational diabetes     Past Surgical History:  Procedure Laterality Date   APPENDECTOMY      Current Medications: Current Outpatient Medications on File Prior to Visit  Medication Sig   magnesium oxide (MAG-OX) 400 (240 Mg) MG tablet Take 1 tablet (400 mg total) by mouth daily.   Multiple Vitamin (MULTIVITAMIN) tablet Take 1 tablet by mouth daily.   pantoprazole (PROTONIX) 40 MG tablet TAKE 1 TABLET BY MOUTH EVERY DAY   sertraline (ZOLOFT) 100 MG tablet Take 100 mg by mouth daily.   No current facility-administered medications on file prior to visit.     Allergies:   Patient has no known allergies.   Social History   Tobacco Use   Smoking status: Never   Smokeless tobacco: Never  Vaping Use   Vaping Use: Never used  Substance Use Topics   Alcohol use: Not Currently   Drug use: Not Currently    Family History: family history includes Hypertension in her father and mother.  ROS:   Please see the history of present illness.  Additional pertinent ROS otherwise unremarkable.  EKGs/Labs/Other Studies Reviewed:    The following studies were reviewed today: Monitor (per note 09/16/20) 21-day Holter monitor at today's visit which revealed sinus rhythm and sinus tachycardia with rare PACs and PVCs.  No evidence of sustained arrhythmias or abnormalities.   LE Venous Reflux 05/23/2021: Right:  - No evidence of deep vein  thrombosis seen in the right lower extremity, from the common femoral through the popliteal veins.  - No evidence of superficial venous thrombosis in the right lower  extremity.  - Venous reflux is noted in the right common femoral vein.  - Venous reflux is noted in the right sapheno-femoral junction.  - Venous reflux is noted in the right greater saphenous vein in the thigh.  - Venous reflux is noted in the right greater saphenous vein in the calf.  - Venous reflux is noted in the  right popliteal vein.  - Venous reflux is noted in the right AASV.     Left:  - No evidence of deep vein thrombosis seen in the left lower extremity, from the common femoral through the popliteal veins.  - No evidence of superficial venous thrombosis in the left lower  extremity.  - Venous reflux is noted in the left common femoral vein.  - Venous reflux is noted in the left sapheno-femoral junction.  - Venous reflux is noted in the left greater saphenous vein in the thigh.  - Venous reflux is noted in the left greater saphenous vein in the calf.  - Venous reflux is noted in the left femoral vein.   Cardiopulmonary Exercise Test 11/02/2020: Interpretation   Notes: Patient gave a very good effort. Pulse-oximetry remained 95-99% for the duration of exercise (artifact in earlier exercise). Exercise was performed on on a treadmill beginning at 2.30mh and 0% grade increasing to steady 3.0 mph and 0% grade with 2.5% grade every other minute, with further modifications to challenge patient's fitness level (see attached document for time-down stages)   ECG:  Resting ECG in normal sinus rhythm. HR response appropriate. There were no sustained arrhythmias or ST-T changes. BP response appropriate.   PFT:  Pre-exercise spirometry was within normal limits. The MVV was normal. Post-exercise spirometry within normal limits.   CPX:  Exercise Capacity- Exercise testing with gas exchange demonstrates a normal peak VO2 of 29.7 ml/kg/min (102% of the age/gender/weight matched sedentary norms). The RER of 1.10 indicates a maximal effort. When adjusted to the patient's ideal body weight of 142.2 lb (64.5 kg) the peak VO2 is 33.6 ml/kg (ibw)/min (110% of the ibw-adjusted predicted).   Cardiovascular response- The O2pulse (a surrogate for stroke volume) increased with incremental exercise reaching peak at 12 ml/beat (100% predicted).   Ventilatory response- The VE/VCO2 slope is normal. The oxygen uptake efficiency  slope (OUES) is normal. The VO2 at the ventilatory threshold was normal at 77% of the predicted peak VO2. At peak exercise, the ventilation reached 63% of the measured MVV and breathing reserve was 44 indicating ventilatory reserve remained.  PETCO2 was normal at 36 mmHg during exercise.   Conclusion: Exercise testing with gas exchange demonstrates normal functional capacity when compared to matched sedentary norms. There is no indication for cardiopulmonary abnormality or exercise-induced bronchospasm.   Cardiac MRI 09/28/20 ([facility]) Cardiac MRI performed with and without contrast on a 3.0 T MRI system to evaluate myocardial morphology,  function, and viability in a 40year old female patient with a past medical history significant for  Raynaud's, GERD, and anxiety about health who presented for an acute visit.  Patient continues to  experience severe shortness of breath with associated chest pain with minimal activity. No evidence of  sustained arrhythmias or abnormalities.  Stress echocardiogram on 07/02/19 revealed normal LV systolic  function with an EF estimated greater than 55% with no significant valvular abnormalities.     Findings:  1.  The left ventricle is normal in cavity size and wall thickness.  Systolic function is normal.  The  calculated LVEF is 61%.  There are no regional wall motion abnormalities.   2.  The right ventricle is normal in cavity size, wall thickness, and systolic function.     3.  The atria are normal in size.   4. The aortic valve is trileaflet in morphology. There is no significant aortic valve stenosis or  regurgitation. There is no significant valvular disease.   5. Delayed enhancement imaging demonstrates no evidence of myocardial infarction, scar or infiltrative  disease.   6.  No intracardiac thrombus visualized. Normal appearing pericardium.   CONCLUSION: Normal study.   CORE EXAM    MEASUREMENTS    ----------------------------------------------------------------------------------------      VOLUMETRIC ANALYSIS       ----------------------------------------------  .---------------------------------------------------------.                   LV     Reference  RV     Reference   +-----+-----------+-------+-----------+-------+-----------+   EDV  ml         123.8   (94-175)  124.8   (94-178)         ml/m^2      69.2   (62-96)    69.7   (61-98)     ESV  ml          48.1   (27-64)    43.7   (25-77)          ml/m^2      26.9   (17-36)    24.4   (17-43)     CO   L/min       6.36              6.82                    L/min/m^2   3.55              3.81                    g/m^2                                            SV   ml          75.7   (61-117)   81.1   (59-111)         ml/m^2      42.3   (40-65)    45.3   (38-62)     EF   %           61.1   (57-75)    65.0   (51-75)    '-----+-----------+-------+-----------+-------+-----------'           CARDIAC OUTPUT HR:  84.0 BPM      LV DIMENSIONS       ----------------------------------------------          WALL THICKNESS - ANTEROSEPTAL:  0.7 cm          WALL THICKNESS - INFEROLATERAL:  0.6 cm          LV EDD:  4.9 cm          LV ESD:  3.0 cm       LA DIMENSIONS (LV SYSTOLE)       ----------------------------------------------  DIAMETER:  3.1 cm          AREA - 2 CHAMBER:  19.7 cm^2          LENGTH - 2 CHAMBER:  4.8 cm          AREA - 4 CHAMBER:  18.1 cm^2          LENGTH - 4 CHAMBER:  5.1 cm          VOLUME:  63 ml       AORTIC ROOT DIMENSIONS       ----------------------------------------------          SINUS OF VALSALVA:  2.7 cm          AORTIC ROOT SIZE:  Normal    ANATOMY   ----------------------------------------------------------------------------------------      LEFT VENTRICLE       ----------------------------------------------           WALL THICKNESS:  Normal          CAVITY SIZE:  Normal          MASS/THROMBUS:  None       RIGHT VENTRICLE       ----------------------------------------------          WALL THICKNESS:  Normal          CONTRACTILITY:  Normal          CAVITY SIZE:  Normal          MASS/THROMBUS:  None       INTERVENTRICULAR SEPTUM       ----------------------------------------------   VENTRICULAR SEPTUM:  Normal       LEFT ATRIUM       ----------------------------------------------          CAVITY SIZE:  Normal          MASS/THROMBUS:  None       RIGHT ATRIUM       ----------------------------------------------          CAVITY SIZE:  Normal          MASS/THROMBUS:  None   ADDITIONAL FINDINGS:  None       PERICARDIUM       ----------------------------------------------          Normal          EFFUSION:  None       PLEURAL EFFUSION       ----------------------------------------------   None     VALVES   ----------------------------------------------------------------------------------------      AORTIC VALVE       ----------------------------------------------          AORTIC VALVE LEAFLETS:  Trileaflet          AORTIC REGURGITATION:  None          AORTIC STENOSIS:  None       MITRAL VALVE       ----------------------------------------------          MITRAL VALVE LEAFLETS:  Normal Leaflets          MITRAL REGURGITATION:  None          MITRAL STENOSIS:  None       TRICUSPID VALVE       ----------------------------------------------          TRICUSPID VALVE LEAFLETS:  Normal Leaflets          TRICUSPID REGURGITATION:  None          TRICUSPID STENOSIS:  None       PULMONIC VALVE       ----------------------------------------------  PULMONIC VALVE LEAFLETS:  Normal Leaflets          PULMONIC REGURGITATION:  None          PULMONIC STENOSIS:  None    17 SEGMENT   ----------------------------------------------------------------------------------------   .------------------------------------------------------------------------------------------.   Segments            Wall Motion   Hyperenhancement  Stress Perfusion  Interpretation   +--------------------+--------------+------------------+------------------+----------------+   Base Anterior       Normal/Hyper  None                                Normal            Base Anteroseptal   Normal/Hyper  None                                Normal            Base Inferoseptal   Normal/Hyper  None                                Normal            Base Inferior       Normal/Hyper  None                                Normal            Base Inferolateral  Normal/Hyper  None                                Normal            Base Anterolateral  Normal/Hyper  None                                Normal            Mid Anterior        Normal/Hyper  None                                Normal            Mid Anteroseptal    Normal/Hyper  None                                Normal            Mid Inferoseptal    Normal/Hyper  None                                Normal            Mid Inferior        Normal/Hyper  None                                Normal            Mid Inferolateral   Normal/Hyper  None  Normal            Mid Anterolateral   Normal/Hyper  None                                Normal            Apical Anterior     Normal/Hyper  None                                Normal            Apical Septal       Normal/Hyper  None                                Normal            Apical Inferior     Normal/Hyper  None                                Normal            Apical Lateral      Normal/Hyper  None                                Normal            Apex                Normal/Hyper  None                                Normal            +--------------------+--------------+------------------+------------------+----------------+   RV Segments         Wall Motion   Hyperenhancement  Stress Perfusion  Interpretation   +--------------------+--------------+------------------+------------------+----------------+   RV Basal Anterior   Normal/Hyper  None                                Normal            RV Basal Inferior   Normal/Hyper  None                                Normal            RV Mid              Normal/Hyper  None                                Normal            RV Apical           Normal/Hyper  None                                Normal           '--------------------+--------------+------------------+------------------+----------------'       FINDINGS       ----------------------------------------------  LV SCAR SIZE (17 SEGMENT):  0 %    SCAN INFO   ==========================================================================================================   GENERAL   ----------------------------------------------------------------------------------------      CONTRAST AGENT       ----------------------------------------------          TYPE:  Dotarem          GD CONCENTRATION:  0.5 M          VOLUME ADMINISTERED:  22 ml          DOSAGE:  0.15 mmol/kg       SEDATION       ----------------------------------------------          SEDATION USED?:  No       VITALS       ----------------------------------------------          HEIGHT:  65.00 in          SYSTOLIC BP:  865 mmHg          HEIGHT:  165.1 cm          DIASTOLIC BP:  75 mmHg          WEIGHT:  158.07 lbs          BASELINE HR:  81 BPM          WEIGHT:  71.70 kgs          HEART RHYTHM:  Normal Sinus Rhythm          BSA:  1.79 m^2       PULSE SEQUENCES       ----------------------------------------------          SSFP cine, 2D LGE segmented, 2D LGE single-shot, Black-blood LGE (or Grey-blood), Pre-contrast T1           mapping, Post-contrast T1 mapping, First-pass perfusion without stress       SETUP       ----------------------------------------------          SCAN TYPE:  Both          SCANNER STRENGTH:  3T          PATIENT TYPE:  Outpatient          INCOMPLETE SCAN:  No          REASON(S) FOR SCAN:  Dyspnea          REFERRING PHYSICIAN:  NICOLE STEPHENS          ATTENDING PHYSICIAN:  Nevin Bloodgood, M.D.          TECHNOLOGIST:  Kandy Garrison   Cardiac monitoring ([facility]) 11/23/19  Echo stress test 07/02/2019 ([facility]) STRESS ECHOCARDIOGRAPHY                  Drugs: None              Target HR: 156 bpm           Maximum Predicted HR: 184 bpm  +----------------------------------------------------------------------------+  :Stage           Duration                     HR         BP                  :  :   RESTING     :                            :73        :  108/68              :  :---------------+----------------------------+----------+---------+          :  :  EXERCISE     :12:00                       :173       :/                   :  :---------------:----------------------------:----------:---------+          :  :  RECOVERY     :3:28                        :99        :114/65              :  :---------------+----------------------------+----------+---------+          :  +----------------------------------------------------------------------------+        Stress Duration: 12: 00 mm: ss        Max Stress H.R.: 173 bpm             Target HR Achieved: Yes  _________________________________________________________________________________________  WALL SEGMENT CHANGES                         Rest                Stress  Anterior Septum Basal: Normal              Hyperkinetic                    Mid: Normal              Hyperkinetic                 Apical: Normal              Hyperkinetic    Anterior Wall Basal: Normal              Hyperkinetic                    Mid: Normal              Hyperkinetic                  Apical: Normal              Hyperkinetic     Lateral Wall Basal: Normal              Hyperkinetic                    Mid: Normal              Hyperkinetic                 Apical: Normal              Hyperkinetic   Posterior Wall Basal: Normal              Hyperkinetic                    Mid: Normal              Hyperkinetic    Inferior Wall Basal: Normal              Hyperkinetic  Mid: Normal              Hyperkinetic                 Apical: Normal              Hyperkinetic  Inferior Septum Basal: Normal              Hyperkinetic                    Mid: Normal              Hyperkinetic             Resting EF: >55% (Est.)                  Stress EF: >55% (Est.)   _________________________________________________________________________________________  ADDITIONAL FINDINGS  _________________________________________________________________________________________  STRESS ECG RESULTS            ECG Results: Normal  _________________________________________________________________________________________  ECHOCARDIOGRAPHIC DESCRIPTIONS  LEFT VENTRICLE                   Size: Normal            Contraction: Normal              LV Masses: No Masses                    LVH: None  RIGHT VENTRICLE                   Size: Normal                       Free Wall: Normal            Contraction: Normal                       RV Masses: No mass  PERICARDIUM                  Fluid: No effusion  _________________________________________________________________________________________   DOPPLER ECHO and OTHER SPECIAL PROCEDURES                 Aortic: No AR                      No AS                 Mitral: TRIVIAL MR                 No MS                         MV Inflow E Vel = nm*      MV Annulus E'Vel = nm*                         E/E'Ratio = nm*              Tricuspid: TRIVIAL TR                 No TS                         173.7 cm/sec peak TR vel   15.1 mmHg peak  RV pressure              Pulmonary: TRIVIAL PR  No PS  _________________________________________________________________________________________  ECHOCARDIOGRAPHIC MEASUREMENTS  2D DIMENSIONS  AORTA                  Values   Normal Range   MAIN PA         Values    Normal Range                Annulus: nm*     [2.1-2.5]              PA Main: nm*       [1.5-2.1]              Aorta Sin: nm*     [2.7-3.3]    RIGHT VENTRICLE            ST Junction: nm*     [2.3-2.9]              RV Base: nm*       [<4.2]              Asc.Aorta: nm*     [2.3-3.1]               RV Mid: nm*       [<3.5]  LEFT VENTRICLE                                      RV Length: nm*       [<8.6]                  LVIDd: nm*     [3.9-5.3]    INFERIOR VENA CAVA                  LVIDs: nm*                           Max. IVC: nm*       [<=2.1]                     FS: nm*     [>25]                 Min. IVC: nm*                    SWT: nm*     [0.5-0.9]    ------------------                    PWT: nm*     [0.5-0.9]    nm* - not measured  LEFT ATRIUM                LA Diam: nm*     [2.7-3.8]            LA A4C Area: nm*     [<20]              LA Volume: nm*     [22-52]  _________________________________________________________________________________________  INTERPRETATION  Normal Stress Echocardiogram  NORMAL RIGHT VENTRICULAR SYSTOLIC FUNCTION  TRIVIAL REGURGITATION NOTED (See above)  NO VALVULAR STENOSIS NOTED  Resting EF: >55% (Est.)  Post Stress EF: >55% (Est.)  ECG Results: Normal  Mitral: TRIVIAL MR  Tricuspid: TRIVIAL TR   EKG:  EKG is personally reviewed.   07/20/2021: NSR at 82 bpm  Recent Labs: 03/16/2021: ALT 16; BUN 13; Creatinine, Ser 0.75; Hemoglobin 13.1; Platelets 285.0; Potassium 4.2;  Sodium 136; TSH 1.30  Recent Lipid Panel No results found for: CHOL, TRIG, HDL, CHOLHDL, VLDL, LDLCALC, LDLDIRECT  Physical Exam:    VS:  BP 100/80 (BP Location: Left Arm, Patient Position: Sitting, Cuff  Size: Normal)   Pulse 79   Ht '5\' 5"'  (1.651 m)   Wt 161 lb (73 kg)   SpO2 100%   BMI 26.79 kg/m     Wt Readings from Last 3 Encounters:  09/21/21 161 lb (73 kg)  08/26/21 161 lb (73 kg)  08/03/21 162 lb (73.5 kg)    GEN: Well nourished, well developed in no acute distress HEENT: Normal, moist mucous membranes NECK: No JVD CARDIAC: regular rhythm, normal S1 and S2, no rubs or gallops. No murmur. VASCULAR: Radial and DP pulses 2+ bilaterally. No carotid bruits RESPIRATORY:  Clear to auscultation without rales, wheezing or rhonchi  ABDOMEN: Soft, non-tender, non-distended MUSCULOSKELETAL:  Ambulates independently SKIN: Warm and dry, no edema NEUROLOGIC:  Alert and oriented x 3. No focal neuro deficits noted. PSYCHIATRIC:  Normal affect    ASSESSMENT:    1. Dysautonomia-like disorder   2. SOB (shortness of breath)   3. Tachycardia   4. Counseling on health promotion and disease prevention     PLAN:    Chest pain Shortness of breath Dysautonomia-like syndrome Please see the extensive summary from previously.  For some reason, the orthostatics were not saved on her prior visit, but to date they have been unremarkable.   We discussed conservative management again today, including hydration, compression, and gradual exercise conditioning.  She endorses that this is a complete change for her. She used to be very active and now is extremely limited by her symptoms.  She is asking to see an EP to make sure there are no other issues with the electrical system of her heart. We reviewed the prior testing she has undergone. I will ask Dr. Caryl Comes to see her for a second opinion.  Offered my support.  Plan for follow up: to be determined based on her visit with Dr. Caryl Comes.  Buford Dresser, MD, PhD, [facility]    Medication Adjustments/Labs and Tests Ordered: Current medicines are reviewed at length with the patient today.  Concerns regarding medicines  are outlined above.  No orders of the defined types were placed in this encounter.   No orders of the defined types were placed in this encounter.   Patient Instructions  Medication Instructions:  No changes today *If you need a refill on your cardiac medications before your next appointment, please call your pharmacy*   Lab Work: None today If you have labs (blood work) drawn today and your tests are completely normal, you will receive your results only by: [place] (if you have MyChart) OR A paper copy in the mail If you have any lab test that is abnormal or we need to change your treatment, we will call you to review the results.   Testing/Procedures: No new   Follow-Up: At [facility], you and your health needs are our priority.  As part of our continuing mission to provide you with exceptional heart care, we have created designated Provider Care Teams.  These Care Teams include your primary Cardiologist (physician) and Advanced Practice Providers (APPs -  Physician Assistants and Nurse Practitioners) who all work together to provide you with the care you need, when you need it.  We recommend signing up for the patient portal called "MyChart".  Sign up information  is provided on this After Visit Summary.  MyChart is used to connect with patients for Virtual Visits (Telemedicine).  Patients are able to view lab/test results, encounter notes, upcoming appointments, etc.  Non-urgent messages can be sent to your provider as well.   To learn more about what you can do with MyChart, go to [email or web address].    Your next appointment:   To be determined, based on results of visit with Dr. Caryl Comes  The format for your next appointment:   Either in person or virtual  Provider:   Buford Dresser, MD    Signed, Buford Dresser, MD PhD 09/21/2021 4:21 PM    [place]

## 2021-10-06 ENCOUNTER — Encounter (HOSPITAL_BASED_OUTPATIENT_CLINIC_OR_DEPARTMENT_OTHER): Payer: Self-pay | Admitting: Cardiology

## 2021-10-12 ENCOUNTER — Ambulatory Visit: Payer: Self-pay | Admitting: Internal Medicine

## 2021-10-13 ENCOUNTER — Ambulatory Visit (INDEPENDENT_AMBULATORY_CARE_PROVIDER_SITE_OTHER): Payer: Self-pay | Admitting: Internal Medicine

## 2021-10-13 ENCOUNTER — Other Ambulatory Visit: Payer: Self-pay

## 2021-10-13 ENCOUNTER — Encounter: Payer: Self-pay | Admitting: Internal Medicine

## 2021-10-13 ENCOUNTER — Other Ambulatory Visit: Payer: Self-pay | Admitting: Internal Medicine

## 2021-10-13 DIAGNOSIS — F419 Anxiety disorder, unspecified: Secondary | ICD-10-CM

## 2021-10-13 DIAGNOSIS — J4530 Mild persistent asthma, uncomplicated: Secondary | ICD-10-CM

## 2021-10-13 DIAGNOSIS — R0602 Shortness of breath: Secondary | ICD-10-CM

## 2021-10-13 DIAGNOSIS — R253 Fasciculation: Secondary | ICD-10-CM

## 2021-10-13 DIAGNOSIS — R519 Headache, unspecified: Secondary | ICD-10-CM

## 2021-10-13 DIAGNOSIS — R531 Weakness: Secondary | ICD-10-CM

## 2021-10-13 NOTE — Progress Notes (Addendum)
Patient ID: Tonya Harris, female   DOB: May 07, 1981, 40 y.o.   MRN: [id number]   Subjective:    Patient ID: Tonya Harris, female    DOB: Jul 21, 1981, 40 y.o.   MRN: [id number]  This visit occurred during the SARS-CoV-2 public health emergency.  Safety protocols were in place, including screening questions prior to the visit, additional usage of staff PPE, and extensive cleaning of exam room while observing appropriate contact time as indicated for disinfecting solutions.   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   HPI Work in for persistent concerns regarding feeling faint, decreased energy and feelings like she cannot take a good breath at times.  No syncope.  Feels sensation - when bends over and comes back up.  Headache if strains.  Sitting position to standing position - develops headache and gets light headed.  Also reports when she bends over, will notice a "jolt" in her "heart".  Has had extensive w/up.  Recently evaluated by cardiology 09/21/21.  Has been referred to an EP and has upcoming appt - with the question  and further evaluation - dysautonomia-like syndrome.  She also describes that her hands and arms - more tingling.  Has seen neurology previously and request to f/u/referral with neurology for reevaluation - for comparison to former evaluation.  Eating.  Is exercising.  Trying to stay hydrated.  Wearing compression hose.     Past Medical History:  Diagnosis Date   Gestational diabetes    Past Surgical History:  Procedure Laterality Date   APPENDECTOMY     Family History  Problem Relation Age of Onset   Hypertension Mother    Hypertension Father    Social History   Socioeconomic History   Marital status: Married    Spouse name: Not on file   Number of children: 4   Years of education: Not on file   Highest education level: Master's degree (e.g., MA, MS, MEng, MEd, MSW, MBA)   Occupational History   Occupation: Runner, broadcasting/film/video   Occupation: Stay at home mom  Tobacco Use   Smoking status: Never   Smokeless tobacco: Never  Vaping Use   Vaping Use: Never used  Substance and Sexual Activity   Alcohol use: Not Currently   Drug use: Not Currently   Sexual activity: Yes    Partners: Male  Other Topics Concern   Not on file  Social History Narrative   Right handed      Master's degree in teaching       Stay at home mom of 4 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  HENT:  Negative for congestion and sinus pressure.   Respiratory:  Negative for cough.        SOB and symptoms as outlined.    Cardiovascular:  Negative for leg swelling.       Sensation in chest as outlined.    Gastrointestinal:  Negative for abdominal pain, diarrhea, nausea and vomiting.  Genitourinary:  Negative for difficulty urinating and dysuria.  Musculoskeletal:  Negative for joint swelling.  Neurological:  Negative for dizziness.       Headaches when strains.   Psychiatric/Behavioral:         Increased stress and anxiety with ongoing medical concerns as outlined.  Seeing psychiatry.        Objective:    Not orthostatic on exam.   BP 122/80   Pulse 65   Temp 99 F (37.2 C) (Oral)   Ht  (1.651 m)   Wt 164 lb (74.4 kg)   LMP 09/29/2021 (Approximate)   SpO2 98%   BMI 27.29 kg/m  Wt Readings from Last 3 Encounters:  10/13/21 164 lb (74.4 kg)  09/21/21 161 lb (73 kg)  08/26/21 161 lb (73 kg)    Physical Exam Vitals reviewed.  Constitutional:      General: She is not in acute distress.    Appearance: Normal appearance.  HENT:     Head: Normocephalic and atraumatic.      Right Ear: External ear normal.     Left Ear: External ear normal.  Eyes:     General: No scleral icterus.       Right eye: No discharge.        Left eye: No discharge.     Conjunctiva/sclera: Conjunctivae normal.  Neck:     Thyroid: No thyromegaly.  Cardiovascular:     Rate and Rhythm: Normal rate and regular rhythm.  Pulmonary:     Effort: No respiratory distress.     Breath sounds: Normal breath sounds. No wheezing.  Abdominal:     General: Bowel sounds are normal.     Palpations: Abdomen is soft.     Tenderness: There is no abdominal tenderness.  Musculoskeletal:        General: No swelling or tenderness.     Cervical back: Neck supple. No tenderness.  Lymphadenopathy:     Cervical: No cervical adenopathy.  Skin:    Findings: No erythema or rash.  Neurological:     Mental Status: She is alert.  Psychiatric:        Mood and Affect: Mood normal.        Behavior: Behavior normal.     Outpatient Encounter Medications as of 10/13/2021  Medication Sig   LORazepam (ATIVAN) 0.5 MG tablet Take by mouth daily as needed.   Multiple Vitamin (MULTIVITAMIN) tablet Take 1 tablet by mouth daily.   pantoprazole (PROTONIX) 40 MG tablet TAKE 1 TABLET BY MOUTH EVERY DAY   sertraline (ZOLOFT) 100 MG tablet Take 100 mg by mouth daily.   [DISCONTINUED] magnesium oxide (MAG-OX) 400 (240 Mg) MG tablet Take 1 tablet (400 mg total) by mouth daily.   No facility-administered encounter medications on file as of 10/13/2021.     Lab Results  Component Value Date   WBC 5.9 03/16/2021   HGB 13.1 03/16/2021   HCT 38.5 03/16/2021   PLT 285.0 03/16/2021   GLUCOSE 90 03/16/2021   ALT 16 03/16/2021   AST 19 03/16/2021   NA 136 03/16/2021   K 4.2 03/16/2021   CL 103 03/16/2021   CREATININE 0.75 03/16/2021   BUN 13 03/16/2021   CO2 24 03/16/2021   TSH 1.30 03/16/2021    VAS [place] LOWER EXTREMITY VENOUS REFLUX  Result Date: 05/23/2021  Lower Venous Reflux Study Patient Name:  Tonya Harris  [facility]  Date of Exam:   05/23/2021 Medical Rec #: [id number]          Accession #:    [id number] Date of Birth: August 01, 1981  Patient Gender: F Patient Age:   50Y Exam Location:  [facility] Procedure:      VAS [place] LOWER EXTREMITY VENOUS REFLUX Referring Phys: [id number] CHRISTOPHER S DICKSON --------------------------------------------------------------------------------  Indications: Varicosities.  Performing Technologist: Jeb Levering RDMS, RVT  Examination Guidelines: A complete evaluation includes B-mode imaging, spectral Doppler, color Doppler, and power Doppler as needed of all accessible portions of each vessel. Bilateral testing is considered an integral part of a complete examination. Limited examinations for reoccurring indications may be performed as noted. The reflux portion of the exam is performed with the patient in reverse Trendelenburg. Significant venous reflux is defined as >500 ms in the superficial venous system, and >1 second in the deep venous system.  Venous Reflux Times +--------------+---------+------+-----------+------------+--------+ RIGHT         Reflux NoRefluxReflux TimeDiameter cmsComments                         Yes                                  +--------------+---------+------+-----------+------------+--------+ CFV                     yes   >1 second                      +--------------+---------+------+-----------+------------+--------+ FV mid        no                                             +--------------+---------+------+-----------+------------+--------+ Popliteal               yes   >1 second                      +--------------+---------+------+-----------+------------+--------+ GSV at SFJ              yes    >500 ms      0.67             +--------------+---------+------+-----------+------------+--------+ GSV prox thighno                            0.35              +--------------+---------+------+-----------+------------+--------+ GSV mid thigh           yes    >500 ms      0.23             +--------------+---------+------+-----------+------------+--------+ GSV dist thigh          yes    >500 ms      0.23             +--------------+---------+------+-----------+------------+--------+ GSV at knee             yes    >500 ms      0.67             +--------------+---------+------+-----------+------------+--------+ GSV prox calf           yes    >500 ms      0.27             +--------------+---------+------+-----------+------------+--------+ SSV Pop Fossa no  0.28             +--------------+---------+------+-----------+------------+--------+ SSV prox calf no                            0.3              +--------------+---------+------+-----------+------------+--------+ SSV mid calf  no                            0.32             +--------------+---------+------+-----------+------------+--------+ AASV Prox               yes    >500 ms      0.31             +--------------+---------+------+-----------+------------+--------+ AASV Mid                yes    >500 ms      0.29             +--------------+---------+------+-----------+------------+--------+  +--------------+---------+------+-----------+------------+--------+ LEFT          Reflux NoRefluxReflux TimeDiameter cmsComments                         Yes                                  +--------------+---------+------+-----------+------------+--------+ CFV                     yes   >1 second                      +--------------+---------+------+-----------+------------+--------+ FV mid                  yes   >1 second                      +--------------+---------+------+-----------+------------+--------+ Popliteal     no                                              +--------------+---------+------+-----------+------------+--------+ GSV at SFJ              yes    >500 ms      0.57             +--------------+---------+------+-----------+------------+--------+ GSV prox thigh          yes    >500 ms      0.39             +--------------+---------+------+-----------+------------+--------+ GSV mid thigh           yes    >500 ms      0.37             +--------------+---------+------+-----------+------------+--------+ GSV dist thigh          yes    >500 ms      0.63             +--------------+---------+------+-----------+------------+--------+ GSV at knee             yes    >500 ms      0.58             +--------------+---------+------+-----------+------------+--------+  GSV prox calf           yes    >500 ms      0.44             +--------------+---------+------+-----------+------------+--------+ SSV Pop Fossa no                            0.18             +--------------+---------+------+-----------+------------+--------+ SSV prox calf no                            0.3              +--------------+---------+------+-----------+------------+--------+ SSV mid calf  no                            0.22             +--------------+---------+------+-----------+------------+--------+   Summary: Right: - No evidence of deep vein thrombosis seen in the right lower extremity, from the common femoral through the popliteal veins. - No evidence of superficial venous thrombosis in the right lower extremity. - Venous reflux is noted in the right common femoral vein. - Venous reflux is noted in the right sapheno-femoral junction. - Venous reflux is noted in the right greater saphenous vein in the thigh. - Venous reflux is noted in the right greater saphenous vein in the calf. - Venous reflux is noted in the right popliteal vein. - Venous reflux is noted in the right AASV.  Left: - No evidence of deep vein thrombosis seen in the left  lower extremity, from the common femoral through the popliteal veins. - No evidence of superficial venous thrombosis in the left lower extremity. - Venous reflux is noted in the left common femoral vein. - Venous reflux is noted in the left sapheno-femoral junction. - Venous reflux is noted in the left greater saphenous vein in the thigh. - Venous reflux is noted in the left greater saphenous vein in the calf. - Venous reflux is noted in the left femoral vein.  *See table(s) above for measurements and observations. Electronically signed by Fabienne Bruns MD on 05/23/2021 at 7:41:19 PM.    Final        Assessment & Plan:   Problem List Items Addressed This Visit     Anxiety    Continues on zoloft.  Followed by Dr Maryruth Bun.  Discussed how anxiety can affect her current symptoms.  She has a prescription for lorazepam.  Discussed using prn.  Follow.        Relevant Medications   LORazepam (ATIVAN) 0.5 MG tablet   Asthma, mild persistent    Breathing overall appears to be stable.  Intolerance to trelegy.       Headache    Headaches as outlined.  MRI 05/2021 - negative.  Request referral to a different neurologist for further evaluation.       Muscle twitching    Has been diagnosed with benign fasciculation syndrome.  Has seen local neurologist.  Given worsening tingling as outlined, request referral to a different neurologist.       Relevant Orders   Ambulatory referral to Neurology   SOB (shortness of breath)    Has seen pulmonary.  Planning EP evaluation as outlined.        Weakness    Has seen neurology, rheumatology and cardiology.  Also pulmonary evaluation.  Reports has noticed symptoms are worse when standing - when she is upright or moving around.  Discussed.  When going from sitting to standing - headache and light headedness.  Just recently reevaluated by cardiology.  Being referred to -  EP - Dr Graciela Husbands - for further evaluation - dysautonomia- like syndrome.  Continue compression  hose.  Continue to stay hydrated.          Dale Ligonier, MD

## 2021-10-16 ENCOUNTER — Encounter: Payer: Self-pay | Admitting: Internal Medicine

## 2021-10-16 NOTE — Assessment & Plan Note (Signed)
Has been diagnosed with benign fasciculation syndrome.  Has seen local neurologist.  Given worsening tingling as outlined, request referral to a different neurologist.

## 2021-10-16 NOTE — Assessment & Plan Note (Signed)
Continues on zoloft.  Followed by Dr Maryruth Bun.  Discussed how anxiety can affect her current symptoms.  She has a prescription for lorazepam.  Discussed using prn.  Follow.

## 2021-10-16 NOTE — Addendum Note (Signed)
Addended by: Charm Barges on: 10/16/2021 05:59 PM   Modules accepted: Orders

## 2021-10-16 NOTE — Assessment & Plan Note (Signed)
Has seen pulmonary.  Planning EP evaluation as outlined.

## 2021-10-16 NOTE — Assessment & Plan Note (Signed)
Has seen neurology, rheumatology and cardiology.  Also pulmonary evaluation.  Reports has noticed symptoms are worse when standing - when she is upright or moving around.  Discussed.  When going from sitting to standing - headache and light headedness.  Just recently reevaluated by cardiology.  Being referred to -  EP - Dr Graciela Husbands - for further evaluation - dysautonomia- like syndrome.  Continue compression hose.  Continue to stay hydrated.

## 2021-10-16 NOTE — Assessment & Plan Note (Signed)
Breathing overall appears to be stable.  Intolerance to trelegy.

## 2021-10-16 NOTE — Assessment & Plan Note (Signed)
Headaches as outlined.  MRI 05/2021 - negative.  Request referral to a different neurologist for further evaluation.

## 2021-10-19 ENCOUNTER — Ambulatory Visit (HOSPITAL_BASED_OUTPATIENT_CLINIC_OR_DEPARTMENT_OTHER): Payer: Self-pay | Admitting: Cardiology

## 2021-10-29 ENCOUNTER — Encounter (HOSPITAL_BASED_OUTPATIENT_CLINIC_OR_DEPARTMENT_OTHER): Payer: Self-pay

## 2021-11-03 ENCOUNTER — Other Ambulatory Visit: Payer: Self-pay

## 2021-11-03 ENCOUNTER — Ambulatory Visit (INDEPENDENT_AMBULATORY_CARE_PROVIDER_SITE_OTHER): Payer: Self-pay

## 2021-11-03 ENCOUNTER — Encounter: Payer: Self-pay | Admitting: Internal Medicine

## 2021-11-03 ENCOUNTER — Ambulatory Visit (INDEPENDENT_AMBULATORY_CARE_PROVIDER_SITE_OTHER): Payer: Self-pay | Admitting: Internal Medicine

## 2021-11-03 VITALS — BP 114/76 | HR 76 | Ht 65.0 in | Wt 169.5 lb

## 2021-11-03 DIAGNOSIS — R000 Tachycardia, unspecified: Secondary | ICD-10-CM

## 2021-11-03 DIAGNOSIS — G9089 Other disorders of autonomic nervous system: Secondary | ICD-10-CM

## 2021-11-03 DIAGNOSIS — G90A Postural orthostatic tachycardia syndrome (POTS): Secondary | ICD-10-CM

## 2021-11-03 DIAGNOSIS — Z7189 Other specified counseling: Secondary | ICD-10-CM

## 2021-11-03 DIAGNOSIS — G908 Other disorders of autonomic nervous system: Secondary | ICD-10-CM

## 2021-11-03 DIAGNOSIS — R0789 Other chest pain: Secondary | ICD-10-CM

## 2021-11-03 DIAGNOSIS — R0602 Shortness of breath: Secondary | ICD-10-CM

## 2021-11-03 DIAGNOSIS — R072 Precordial pain: Secondary | ICD-10-CM

## 2021-11-03 DIAGNOSIS — I8393 Asymptomatic varicose veins of bilateral lower extremities: Secondary | ICD-10-CM

## 2021-11-03 NOTE — Patient Instructions (Signed)
Medication Instructions:  No changes at this time.  *If you need a refill on your cardiac medications before your next appointment, please call your pharmacy*   Lab Work: None  If you have labs (blood work) drawn today and your tests are completely normal, you will receive your results only by: MyChart Message (if you have MyChart) OR A paper copy in the mail If you have any lab test that is abnormal or we need to change your treatment, we will call you to review the results.   Testing/Procedures: Your provider has ordered a heart monitor to wear for 14 days. This will be mailed to your home with instructions on placement. Once you have finished the time frame requested, you will return monitor in box provided.      Follow-Up: At [facility], you and your health needs are our priority.  As part of our continuing mission to provide you with exceptional heart care, we have created designated Provider Care Teams.  These Care Teams include your primary Cardiologist (physician) and Advanced Practice Providers (APPs -  Physician Assistants and Nurse Practitioners) who all work together to provide you with the care you need, when you need it.   Your next appointment:   4 week(s)  The format for your next appointment:   In Person  Provider:   Sherryl Manges, MD

## 2021-11-03 NOTE — Progress Notes (Signed)
ELECTROPHYSIOLOGY CONSULT NOTE  Patient ID: Tonya Harris, MRN: [id number], DOB/AGE: [birth year] 40 y.o. Admit date: (Not on file) Date of Consult: 11/03/2021  Primary Physician: Dale North Merrick, MD Primary Cardiologist: Ronnie Doss     Tonya Harris is a 40 y.o. female who is being seen today for the evaluation of / dysatuonomia at the request of [facility].    HPI Tonya Harris is a 40 y.o. female was been seen at multiple institutions by multiple physicians for complaints of episodic dyspnea on exertion associated with an abrupt "funny sensation" in her chest that she describes also as a jolt.  It is fleeting occurring a number of times a day.  Began 2019.  Associated with fear of impending doom and progressive and somewhat debilitating anxieties.  She was seen by psychiatry who felt that she was "delusional; "she has been treated by her primary care physician with sertraline with some improvement.  There is been some questions as to whether this is dysautonomia and she has been encouraged to increase her fluid intake  She has seen multiple experts in different specialties including cardiology, OB/GYN, endocrinology and rheumatology at [facility], [facility], [facility] and [facility] She has been noted to have severe anxiety and she takes sertraline and has seen psychiatry in the past.  Her cardiac evaluation has included a CPX test with a normal heart rate response, stress echo, cardiac MRI, pulmonary function testing and monitors.  Monitor 1/21 was reviewed.  Approximately 0.1% PVC; however, the majority of her symptomatic events were associated with PVCs. CPX 12/21 report was reviewed-normal PFTs 4/21 report was reviewed, question possible reactive airways disease, with no evidence of exercise-induced bronchospasm on CPX testing  DATE TEST EF   8/20 Echo stress  No ischemia  4/21 Echo   65-70% %   11/21 cMRI 65 Normal   Date Cr K Hgb TSH  5/22 0.75 4.2 13.1 1.3              Past Medical History:  Diagnosis Date   Anxiety    Dyspnea-episodic    Gestational diabetes    Palpitations       Surgical History:  Past Surgical History:  Procedure Laterality Date   APPENDECTOMY       Home Meds: Current Meds  Medication Sig   LORazepam (ATIVAN) 0.5 MG tablet Take by mouth daily as needed.   magnesium oxide (MAG-OX) 400 MG tablet TAKE 1 TABLET BY MOUTH EVERY DAY   Multiple Vitamin (MULTIVITAMIN) tablet Take 1 tablet by mouth daily.   pantoprazole (PROTONIX) 40 MG tablet TAKE 1 TABLET BY MOUTH EVERY DAY   sertraline (ZOLOFT) 100 MG tablet Take 50 mg by mouth daily.    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  Tobacco Use   Smoking status: Never   Smokeless tobacco: Never  Vaping Use   Vaping Use: Never used  Substance and Sexual Activity   Alcohol use: Not Currently   Drug use: Not Currently   Sexual activity: Yes    Partners: Male  Other Topics Concern   Not on file  Social History Narrative   Right  handed      Master's degree in teaching       Stay 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     All other systems reviewed and negative.    Physical Exam: Blood pressure 114/76, pulse 76, height 5\' 5"  (1.651 m), weight  169 lb 8 oz (76.9 kg), SpO2 99 %. General: Well developed, well nourished female in no acute distress. Head: Normocephalic, atraumatic, sclera non-icteric, no xanthomas, nares are without discharge. EENT: normal  Lymph Nodes:  none Neck: Negative for carotid bruits. JVD not elevated. Back:without scoliosis kyphosis Lungs: Clear bilaterally to auscultation without wheezes, rales, or rhonchi. Breathing is unlabored. Heart: RRR with S1 S2. No   murmur . No rubs, or gallops appreciated. Abdomen: Soft, non-tender, non-distended with normoactive bowel sounds. No hepatomegaly. No rebound/guarding. No obvious abdominal masses. Msk:  Strength and tone appear normal for age. Extremities: No clubbing or cyanosis. No edema.  Distal pedal pulses are 2+ and equal bilaterally. Skin: Warm and Dry Neuro: Alert and oriented X 3. CN III-XII intact Grossly normal sensory and motor function . Psych:  Responds to questions appropriately with a normal affect.        EKG: Sinus at 76 Intervals 14/09/39   Assessment and Plan:  Palpitations-"jolts"  Dyspnea-intermittent  Anxiety  Orthostatic hypotension   The patient has episodic symptoms that she describes as jolts which perhaps are indeed palpitations.  We reviewed her event recorder from 12/20 where in about 80% of her symptom events were associated with her very infrequent PVCs.  As she thinks back on that date a couple of years ago she thinks this may be the same as her jolts and thus suggests to me that possibly even her infrequent PVCs are responsible for what she describes as the worst part of her diffuse symptom complex.  It could certainly be associated with the the episodic dyspnea which 2 is very brief.  In light of her normal cardiovascular evaluation I have told her the likelihood that this is associated with her dying is extremely remote.  This is for her moderately reassuring.  We will try to clarify this mechanism for her symptoms by  repeating her event recorder now 2 years later.  Depending on the frequency of the events, biofeedback to dissociate her reaction to her ectopy could be helpful, antiarrhythmic therapy and or catheter ablation might also be useful  Her orthostatic vital signs showed a modest decrease in blood pressure from lying--sitting but with a decrease in heart rate; not sure that this is real.  Moreover, her heart rate excursion with CPX was quite appropriate.  It is worth noting that her activation of her monitor when it was not with PVCs was mostly with mild sinus tachycardia.  I have also strongly suggested emotional support and have recommended she consider restoration place    83 minutes spent in reviewing records and counseling patient      1/21

## 2021-11-10 DIAGNOSIS — G90A Postural orthostatic tachycardia syndrome (POTS): Secondary | ICD-10-CM

## 2021-11-10 DIAGNOSIS — R0602 Shortness of breath: Secondary | ICD-10-CM

## 2021-11-10 DIAGNOSIS — G908 Other disorders of autonomic nervous system: Secondary | ICD-10-CM

## 2021-11-10 DIAGNOSIS — R000 Tachycardia, unspecified: Secondary | ICD-10-CM

## 2021-11-14 ENCOUNTER — Institutional Professional Consult (permissible substitution): Payer: Self-pay | Admitting: Internal Medicine

## 2021-11-14 ENCOUNTER — Ambulatory Visit (INDEPENDENT_AMBULATORY_CARE_PROVIDER_SITE_OTHER): Payer: Self-pay | Admitting: Internal Medicine

## 2021-11-14 ENCOUNTER — Encounter: Payer: Self-pay | Admitting: Internal Medicine

## 2021-11-14 ENCOUNTER — Other Ambulatory Visit: Payer: Self-pay

## 2021-11-14 DIAGNOSIS — I8393 Asymptomatic varicose veins of bilateral lower extremities: Secondary | ICD-10-CM

## 2021-11-14 DIAGNOSIS — R531 Weakness: Secondary | ICD-10-CM

## 2021-11-14 DIAGNOSIS — R5383 Other fatigue: Secondary | ICD-10-CM

## 2021-11-14 DIAGNOSIS — J4530 Mild persistent asthma, uncomplicated: Secondary | ICD-10-CM

## 2021-11-14 NOTE — Progress Notes (Signed)
Patient ID: Tonya Harris, female   DOB: 03-04-1981, 41 y.o.   MRN: [id number]   Subjective:    Patient ID: Tonya Harris, female    DOB: 03-04-1981, 41 y.o.   MRN: [id number]  This visit occurred during the SARS-CoV-2 public health emergency.  Safety protocols were in place, including screening questions prior to the visit, additional usage of staff PPE, and extensive cleaning of exam room while observing appropriate contact time as indicated for disinfecting solutions.   Patient here for a scheduled follow up.     HPI Here to follow up regarding intermittent episodes of weakness, sob, "jolt" sensation - chest, etc.  Recently evaluated by Dr Graciela HusbandsKlein.  Question of dysautonomia.  Cardiac evaluation has included a CPX test with normal heart rate response, stress echo, cardiac MRI, PFTs and monitors.  He reviewed her event recorder - about 80% of her symptom events were associated with very infrequent PVCs.  Recommended repeating event recorder.  She is wearing a monitor now.  Trying to stay hydrated.  Wearing compression hose.  Still with persistent symptoms.  Has planned f/u with Dr Graciela HusbandsKlein.  Eating.  Trying to stay as active as possible.  Seeing Dr Maryruth BunKapur.  On zoloft.  Has lorazepam to take if needed.      Past Medical History:  Diagnosis Date   Anxiety    Dyspnea-episodic    Gestational diabetes    Palpitations    Past Surgical History:  Procedure Laterality Date   APPENDECTOMY     Family History  Problem Relation Age of Onset   Hypertension Mother    Hypertension Father    Social History   Socioeconomic History   Marital status: Married    Spouse name: Not on file   Number of children: 4   Years of education: Not on file   Highest education level: Master's degree (e.g., MA, MS, MEng, MEd, MSW, MBA)  Occupational History   Occupation: Runner, broadcasting/film/videoTeacher   Occupation: Stay at home mom  Tobacco Use   Smoking status: Never   Smokeless tobacco: Never  Vaping Use   Vaping Use: Never  used  Substance and Sexual Activity   Alcohol use: Not Currently   Drug use: Not Currently   Sexual activity: Yes    Partners: Male  Other Topics Concern   Not on file  Social History Narrative   Right handed      Master's degree in teaching       Stay at home mom of 4 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  HENT:  Negative for congestion and sinus pressure.   Respiratory:  Negative for cough.        SOB and "jolts" as outlined.   Cardiovascular:  Negative for palpitations and leg swelling.  Gastrointestinal:  Negative for abdominal pain, diarrhea, nausea and vomiting.  Genitourinary:  Negative for difficulty urinating and dysuria.  Musculoskeletal:  Negative for joint swelling and myalgias.  Skin:  Negative for color change and rash.  Neurological:  Negative for dizziness and headaches.  Psychiatric/Behavioral:  Negative for dysphoric mood.        Increased stress related to her medical issues.  Objective:     BP 110/72 (BP Location: Left Arm, Patient Position: Sitting, Cuff Size: Normal)    Pulse 73    Temp (!) 97.2 F (36.2 C) (Temporal)    Ht 5\' 5"  (1.651 m)    Wt 168 lb 9.6 oz (76.5 kg)    LMP 10/31/2021 (Within Weeks)    SpO2 97%    BMI 28.06 kg/m  Wt Readings from Last 3 Encounters:  11/14/21 168 lb 9.6 oz (76.5 kg)  11/03/21 169 lb 8 oz (76.9 kg)  10/13/21 164 lb (74.4 kg)    Physical Exam Vitals reviewed.  Constitutional:      General: She is not in acute distress.    Appearance: Normal appearance.  HENT:     Head: Normocephalic and atraumatic.     Right Ear: External ear normal.     Left Ear: External ear normal.  Eyes:     General: No  scleral icterus.       Right eye: No discharge.        Left eye: No discharge.     Conjunctiva/sclera: Conjunctivae normal.  Neck:     Thyroid: No thyromegaly.  Cardiovascular:     Rate and Rhythm: Normal rate and regular rhythm.  Pulmonary:     Effort: No respiratory distress.     Breath sounds: Normal breath sounds. No wheezing.  Abdominal:     General: Bowel sounds are normal.     Palpations: Abdomen is soft.     Tenderness: There is no abdominal tenderness.  Musculoskeletal:        General: No swelling or tenderness.     Cervical back: Neck supple. No tenderness.  Lymphadenopathy:     Cervical: No cervical adenopathy.  Skin:    Findings: No erythema or rash.  Neurological:     Mental Status: She is alert.  Psychiatric:        Mood and Affect: Mood normal.        Behavior: Behavior normal.     Outpatient Encounter Medications as of 11/14/2021  Medication Sig   magnesium oxide (MAG-OX) 400 MG tablet TAKE 1 TABLET BY MOUTH EVERY DAY   Multiple Vitamin (MULTIVITAMIN) tablet Take 1 tablet by mouth daily.   pantoprazole (PROTONIX) 40 MG tablet TAKE 1 TABLET BY MOUTH EVERY DAY   sertraline (ZOLOFT) 100 MG tablet Take 50 mg by mouth daily.   LORazepam (ATIVAN) 0.5 MG tablet Take by mouth daily as needed. (Patient not taking: Reported on 11/14/2021)   No facility-administered encounter medications on file as of 11/14/2021.     Lab Results  Component Value Date   WBC 5.9 03/16/2021   HGB 13.1 03/16/2021   HCT 38.5 03/16/2021   PLT 285.0 03/16/2021   GLUCOSE 90 03/16/2021   ALT 16 03/16/2021   AST 19 03/16/2021   NA 136 03/16/2021   K 4.2 03/16/2021   CL 103 03/16/2021   CREATININE 0.75 03/16/2021   BUN 13 03/16/2021   CO2 24 03/16/2021   TSH 1.30 03/16/2021    VAS 05/16/2021 LOWER EXTREMITY VENOUS REFLUX  Result Date: 05/23/2021  Lower Venous Reflux Study Patient Name:  Tonya Harris [facility]  Date of Exam:   05/23/2021 Medical Rec #: [id number]          Accession #:    [id number]  Date of Birth: 09-Oct-1981          Patient Gender: F Patient Age:   40Y Exam Location:  05-31-[facility] Procedure:  VAS US LOWER EXTREMITY VENOUS REFLUX Referring Phys: [id number] CHRISTOPHER S DICKSON --------------------------------------------------------------------------------  Indications: Varicosities.  Performing Technologist: Jeb LeveringJill Parker RDMS, RVT  Examination Guidelines: A complete evaluation includes B-mode imaging, spectral Doppler, color Doppler, and power Doppler as needed of all accessible portions of each vessel. Bilateral testing is considered an integral part of a complete examination. Limited examinations for reoccurring indications may be performed as noted. The reflux portion of the exam is performed with the patient in reverse Trendelenburg. Significant venous reflux is defined as >500 ms in the superficial venous system, and >1 second in the deep venous system.  Venous Reflux Times +--------------+---------+------+-----------+------------+--------+  RIGHT          Reflux No Reflux Reflux Time Diameter cms Comments                             Yes                                      +--------------+---------+------+-----------+------------+--------+  CFV                       yes    >1 second                         +--------------+---------+------+-----------+------------+--------+  FV mid         no                                                  +--------------+---------+------+-----------+------------+--------+  Popliteal                 yes    >1 second                         +--------------+---------+------+-----------+------------+--------+  GSV at SFJ                yes     >500 ms       0.67               +--------------+---------+------+-----------+------------+--------+  GSV prox thigh no                               0.35               +--------------+---------+------+-----------+------------+--------+  GSV mid thigh             yes     >500 ms       0.23                +--------------+---------+------+-----------+------------+--------+  GSV dist thigh            yes     >500 ms       0.23               +--------------+---------+------+-----------+------------+--------+  GSV at knee               yes     >500 ms       0.67               +--------------+---------+------+-----------+------------+--------+  GSV prox  calf             yes     >500 ms       0.27               +--------------+---------+------+-----------+------------+--------+  SSV Pop Fossa  no                               0.28               +--------------+---------+------+-----------+------------+--------+  SSV prox calf  no                               0.3                +--------------+---------+------+-----------+------------+--------+  SSV mid calf   no                               0.32               +--------------+---------+------+-----------+------------+--------+  AASV Prox                 yes     >500 ms       0.31               +--------------+---------+------+-----------+------------+--------+  AASV Mid                  yes     >500 ms       0.29               +--------------+---------+------+-----------+------------+--------+  +--------------+---------+------+-----------+------------+--------+  LEFT           Reflux No Reflux Reflux Time Diameter cms Comments                             Yes                                      +--------------+---------+------+-----------+------------+--------+  CFV                       yes    >1 second                         +--------------+---------+------+-----------+------------+--------+  FV mid                    yes    >1 second                         +--------------+---------+------+-----------+------------+--------+  Popliteal      no                                                  +--------------+---------+------+-----------+------------+--------+  GSV at SFJ                yes     >500 ms       0.57                +--------------+---------+------+-----------+------------+--------+  GSV prox thigh            yes     >500 ms       0.39               +--------------+---------+------+-----------+------------+--------+  GSV mid thigh             yes     >500 ms       0.37               +--------------+---------+------+-----------+------------+--------+  GSV dist thigh            yes     >500 ms       0.63               +--------------+---------+------+-----------+------------+--------+  GSV at knee               yes     >500 ms       0.58               +--------------+---------+------+-----------+------------+--------+  GSV prox calf             yes     >500 ms       0.44               +--------------+---------+------+-----------+------------+--------+  SSV Pop Fossa  no                               0.18               +--------------+---------+------+-----------+------------+--------+  SSV prox calf  no                               0.3                +--------------+---------+------+-----------+------------+--------+  SSV mid calf   no                               0.22               +--------------+---------+------+-----------+------------+--------+   Summary: Right: - No evidence of deep vein thrombosis seen in the right lower extremity, from the common femoral through the popliteal veins. - No evidence of superficial venous thrombosis in the right lower extremity. - Venous reflux is noted in the right common femoral vein. - Venous reflux is noted in the right sapheno-femoral junction. - Venous reflux is noted in the right greater saphenous vein in the thigh. - Venous reflux is noted in the right greater saphenous vein in the calf. - Venous reflux is noted in the right popliteal vein. - Venous reflux is noted in the right AASV.  Left: - No evidence of deep vein thrombosis seen in the left lower extremity, from the common femoral through the popliteal veins. - No evidence of superficial venous thrombosis in the left lower  extremity. - Venous reflux is noted in the left common femoral vein. - Venous reflux is noted in the left sapheno-femoral junction. - Venous reflux is noted in the left greater saphenous vein in the thigh. - Venous reflux is noted in the left greater saphenous vein in the calf. - Venous reflux is noted in the left femoral vein.  *See table(s) above for measurements and observations. Electronically signed by Fabienne Bruns MD on 05/23/2021 at 7:41:19  PM.    Final        Assessment & Plan:   Problem List Items Addressed This Visit     Asthma, mild persistent    Breathing overall appears to be stable.  Intolerance to trelegy.       Fatigue    Has had extensive work-up as outlined previously.  Continue compression hose and continue to stay hydrated. Await monitor results.       Varicose veins of both lower extremities    Continue compression hose.        Weakness    Has seen neurology, rheumatology and cardiology.  Also pulmonary evaluation.  Previously reported -  has noticed symptoms are worse when standing - when she is upright or moving around.  Discussed.  When going from sitting to standing - headache and light headedness.  Just recently reevaluated by cardiology.   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  Continue compression hose.  Continue to stay hydrated.  Wearing monitor.  Hold on any further w/up until can review results.         Dale Brownell, MD

## 2021-11-17 ENCOUNTER — Encounter: Payer: Self-pay | Admitting: Internal Medicine

## 2021-11-18 NOTE — Telephone Encounter (Signed)
Update for you. 

## 2021-11-18 NOTE — Telephone Encounter (Signed)
Called patient. Confirmed stable. No acute issues at this time. Patient will be evaluated if any acute sx before monday

## 2021-11-18 NOTE — Telephone Encounter (Signed)
This occurred last night.  Please confirm she is doing better today.  If any acute issues, needs to be seen

## 2021-11-19 ENCOUNTER — Encounter: Payer: Self-pay | Admitting: Internal Medicine

## 2021-11-19 NOTE — Assessment & Plan Note (Signed)
Has seen neurology, rheumatology and cardiology.  Also pulmonary evaluation.  Previously reported -  has noticed symptoms are worse when standing - when she is upright or moving around.  Discussed.  When going from sitting to standing - headache and light headedness.  Just recently reevaluated by cardiology.   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  Continue compression hose.  Continue to stay hydrated.  Wearing monitor.  Hold on any further w/up until can review results.

## 2021-11-19 NOTE — Assessment & Plan Note (Signed)
Has had extensive work-up as outlined previously.  Continue compression hose and continue to stay hydrated. Await monitor results.

## 2021-11-19 NOTE — Assessment & Plan Note (Signed)
Breathing overall appears to be stable.  Intolerance to trelegy.  

## 2021-11-19 NOTE — Assessment & Plan Note (Signed)
Continue compression hose.   

## 2021-11-21 ENCOUNTER — Ambulatory Visit (INDEPENDENT_AMBULATORY_CARE_PROVIDER_SITE_OTHER): Payer: Self-pay | Admitting: Internal Medicine

## 2021-11-21 ENCOUNTER — Other Ambulatory Visit: Payer: Self-pay

## 2021-11-21 ENCOUNTER — Encounter: Payer: Self-pay | Admitting: Internal Medicine

## 2021-11-21 VITALS — BP 118/80 | HR 95 | Temp 98.1°F | Ht 65.0 in | Wt 167.4 lb

## 2021-11-21 DIAGNOSIS — I8393 Asymptomatic varicose veins of bilateral lower extremities: Secondary | ICD-10-CM

## 2021-11-21 DIAGNOSIS — F419 Anxiety disorder, unspecified: Secondary | ICD-10-CM

## 2021-11-21 DIAGNOSIS — R350 Frequency of micturition: Secondary | ICD-10-CM

## 2021-11-21 DIAGNOSIS — R531 Weakness: Secondary | ICD-10-CM

## 2021-11-21 DIAGNOSIS — R5383 Other fatigue: Secondary | ICD-10-CM

## 2021-11-21 DIAGNOSIS — J4530 Mild persistent asthma, uncomplicated: Secondary | ICD-10-CM

## 2021-11-21 NOTE — Progress Notes (Signed)
Patient ID: Tonya Harris, female   DOB: 08-15-1981, 41 y.o.   MRN: [id number]   Subjective:    Patient ID: Tonya Harris, female    DOB: [birth year], 41 y.o.   MRN: [id number]  This visit occurred during the SARS-CoV-2 public health emergency.  Safety protocols were in place, including screening questions prior to the visit, additional usage of staff PPE, and extensive cleaning of exam room while observing appropriate contact time as indicated for disinfecting solutions.   Patient here for work in appt.   Marland Kitchen   HPI Work in with concerns regarding weakness, tingling sensation in legs,arms and hands and some increased urination.  She has had persistent intermittent episodes of weakness, sob, "jolt sensation" in her chest, etc.  Has had extensive w/up previously including stress echo, cardiac MRI, PFTs and monitors.  Previous CPX test with normal heart rate response.  Recently saw Dr Graciela Husbands and he reviewed her event recorder as outlined previously.  Noted about 80% of her symptom events were associated with very infrequent PVCs.  Wearing a monitor now.  She reports that over the weekend - noticed increased weak spells.  Had episodes of feeling cold and clammy, then flushed feelings.  Noticed tingling in her hands, legs, arms.  Episode of elevated blood pressure, but blood pressure has returned to normal.  Blood sugar normal.  Noticed increased urination.  Mouth dry.  Currently - concerned regarding persistent intermittent symptoms.  Frustrated - unclear etiology.  Understanding - trying to work through diagnosis.  Eating.  Discussed eating regular meals and not going long periods without eating.  No vomiting.  Bowels stable.  No increased cough or congestion.     Past Medical History:  Diagnosis Date   Anxiety    Dyspnea-episodic    Gestational diabetes    Palpitations    Past Surgical History:  Procedure Laterality Date   APPENDECTOMY     Family History  Problem Relation Age of Onset    Hypertension Mother    Hypertension Father    Social History   Socioeconomic History   Marital status: Married    Spouse name: Not on file   Number of children: 4   Years of education: Not on file   Highest education level: Master's degree (e.g., MA, MS, MEng, MEd, MSW, MBA)  Occupational History   Occupation: Runner, broadcasting/film/video   Occupation: Stay at home mom  Tobacco Use   Smoking status: Never   Smokeless tobacco: Never  Vaping Use   Vaping Use: Never used  Substance and Sexual Activity   Alcohol use: Not Currently   Drug use: Not Currently   Sexual activity: Yes    Partners: Male  Other Topics Concern   Not on file  Social History Narrative   Right handed      Master's degree in teaching       Stay at home mom of 4 children currently   Social Determinants of Health   Financial Resource Strain: Not on file  Food Insecurity: Not on file  Transportation Needs: Not on file  Physical Activity: Not on file  Stress: Not on file  Social Connections: Not on file     Review of Systems  Constitutional:  Positive for fatigue. Negative for appetite change.  HENT:  Negative for congestion and sinus pressure.   Respiratory:  Negative for cough.        Intermittent symptoms - chest sensation, etc as outlined.    Cardiovascular:  Negative for palpitations  and leg swelling.  Gastrointestinal:  Negative for abdominal pain, diarrhea, nausea and vomiting.  Genitourinary:  Positive for frequency. Negative for difficulty urinating and dysuria.  Musculoskeletal:  Negative for joint swelling and myalgias.  Skin:  Negative for rash.       Intermittent episodes - fingers - red.   Neurological:  Negative for dizziness and headaches.  Psychiatric/Behavioral:         Increased stress related to current symptoms.         Objective:     BP 118/80 (BP Location: Left Arm, Patient Position: Sitting, Cuff Size: Normal)    Pulse 95    Temp 98.1 F (36.7 C) (Oral)    Ht 5\' 5"  (1.651 m)    Wt 167  lb 6.4 oz (75.9 kg)    LMP 10/31/2021 (Within Weeks)    SpO2 99%    BMI 27.86 kg/m  Wt Readings from Last 3 Encounters:  11/21/21 167 lb 6.4 oz (75.9 kg)  11/14/21 168 lb 9.6 oz (76.5 kg)  11/03/21 169 lb 8 oz (76.9 kg)  Not orthostatic on exam.   Physical Exam Vitals reviewed.  Constitutional:      General: She is not in acute distress.    Appearance: Normal appearance.  HENT:     Head: Normocephalic and atraumatic.     Right Ear: External ear normal.     Left Ear: External ear normal.  Eyes:     General: No scleral icterus.       Right eye: No discharge.        Left eye: No discharge.     Conjunctiva/sclera: Conjunctivae normal.  Neck:     Thyroid: No thyromegaly.  Cardiovascular:     Rate and Rhythm: Normal rate and regular rhythm.  Pulmonary:     Effort: No respiratory distress.     Breath sounds: Normal breath sounds. No wheezing.  Abdominal:     General: Bowel sounds are normal.     Palpations: Abdomen is soft.     Tenderness: There is no abdominal tenderness.  Musculoskeletal:        General: No swelling or tenderness.     Cervical back: Neck supple. No tenderness.  Lymphadenopathy:     Cervical: No cervical adenopathy.  Skin:    Findings: No erythema or rash.  Neurological:     Mental Status: She is alert.  Psychiatric:        Mood and Affect: Mood normal.        Behavior: Behavior normal.     Outpatient Encounter Medications as of 11/21/2021  Medication Sig   magnesium oxide (MAG-OX) 400 MG tablet TAKE 1 TABLET BY MOUTH EVERY DAY   Multiple Vitamin (MULTIVITAMIN) tablet Take 1 tablet by mouth daily.   pantoprazole (PROTONIX) 40 MG tablet TAKE 1 TABLET BY MOUTH EVERY DAY   sertraline (ZOLOFT) 100 MG tablet Take 50 mg by mouth daily.   LORazepam (ATIVAN) 0.5 MG tablet Take by mouth daily as needed. (Patient not taking: Reported on 11/14/2021)   No facility-administered encounter medications on file as of 11/21/2021.     Lab Results  Component Value Date    WBC 9.1 11/21/2021   HGB 12.9 11/21/2021   HCT 40.4 11/21/2021   PLT 320.0 11/21/2021   GLUCOSE 91 11/21/2021   ALT 25 11/21/2021   AST 21 11/21/2021   NA 139 11/21/2021   K 3.9 11/21/2021   CL 103 11/21/2021   CREATININE 0.68 11/21/2021   BUN  7 11/21/2021   CO2 27 11/21/2021   TSH 1.00 11/21/2021    VAS [place] LOWER EXTREMITY VENOUS REFLUX  Result Date: 05/23/2021  Lower Venous Reflux Study Patient Name:  BARBARANN KELLY [facility]  Date of Exam:   05/23/2021 Medical Rec #: [id number]          Accession #:    [id number] Date of Birth: 1981/07/19          Patient Gender: F Patient Age:   40Y Exam Location:  [facility] Procedure:      VAS [place] LOWER EXTREMITY VENOUS REFLUX Referring Phys: [id number] CHRISTOPHER S DICKSON --------------------------------------------------------------------------------  Indications: Varicosities.  Performing Technologist: Jeb Levering RDMS, RVT  Examination Guidelines: A complete evaluation includes B-mode imaging, spectral Doppler, color Doppler, and power Doppler as needed of all accessible portions of each vessel. Bilateral testing is considered an integral part of a complete examination. Limited examinations for reoccurring indications may be performed as noted. The reflux portion of the exam is performed with the patient in reverse Trendelenburg. Significant venous reflux is defined as >500 ms in the superficial venous system, and >1 second in the deep venous system.  Venous Reflux Times +--------------+---------+------+-----------+------------+--------+  RIGHT          Reflux No Reflux Reflux Time Diameter cms Comments                             Yes                                      +--------------+---------+------+-----------+------------+--------+  CFV                       yes    >1 second                         +--------------+---------+------+-----------+------------+--------+  FV mid         no                                                   +--------------+---------+------+-----------+------------+--------+  Popliteal                 yes    >1 second                         +--------------+---------+------+-----------+------------+--------+  GSV at SFJ                yes     >500 ms       0.67               +--------------+---------+------+-----------+------------+--------+  GSV prox thigh no                               0.35               +--------------+---------+------+-----------+------------+--------+  GSV mid thigh             yes     >500 ms       0.23               +--------------+---------+------+-----------+------------+--------+  GSV dist thigh            yes     >500 ms       0.23               +--------------+---------+------+-----------+------------+--------+  GSV at knee               yes     >500 ms       0.67               +--------------+---------+------+-----------+------------+--------+  GSV prox calf             yes     >500 ms       0.27               +--------------+---------+------+-----------+------------+--------+  SSV Pop Fossa  no                               0.28               +--------------+---------+------+-----------+------------+--------+  SSV prox calf  no                               0.3                +--------------+---------+------+-----------+------------+--------+  SSV mid calf   no                               0.32               +--------------+---------+------+-----------+------------+--------+  AASV Prox                 yes     >500 ms       0.31               +--------------+---------+------+-----------+------------+--------+  AASV Mid                  yes     >500 ms       0.29               +--------------+---------+------+-----------+------------+--------+  +--------------+---------+------+-----------+------------+--------+  LEFT           Reflux No Reflux Reflux Time Diameter cms Comments                             Yes                                       +--------------+---------+------+-----------+------------+--------+  CFV                       yes    >1 second                         +--------------+---------+------+-----------+------------+--------+  FV mid                    yes    >1 second                         +--------------+---------+------+-----------+------------+--------+  Popliteal      no                                                  +--------------+---------+------+-----------+------------+--------+  [facility]                yes     >500 ms       0.57               +--------------+---------+------+-----------+------------+--------+  GSV prox thigh            yes     >500 ms       0.39               +--------------+---------+------+-----------+------------+--------+  GSV mid thigh             yes     >500 ms       0.37               +--------------+---------+------+-----------+------------+--------+  GSV dist thigh            yes     >500 ms       0.63               +--------------+---------+------+-----------+------------+--------+  GSV at knee               yes     >500 ms       0.58               +--------------+---------+------+-----------+------------+--------+  GSV prox calf             yes     >500 ms       0.44               +--------------+---------+------+-----------+------------+--------+  SSV Pop Fossa  no                               0.18               +--------------+---------+------+-----------+------------+--------+  SSV prox calf  no                               0.3                +--------------+---------+------+-----------+------------+--------+  SSV mid calf   no                               0.22               +--------------+---------+------+-----------+------------+--------+   Summary: Right: - No evidence of deep vein thrombosis seen in the right lower extremity, from the common femoral through the popliteal veins. - No evidence of superficial venous thrombosis in the right lower extremity. - Venous reflux is noted in  the right common femoral vein. - Venous reflux is noted in the right sapheno-femoral junction. - Venous reflux is noted in the right greater saphenous vein in the thigh. - Venous reflux is noted in the right greater saphenous vein in the calf. - Venous reflux is noted in the right popliteal vein. - Venous reflux is noted in the right AASV.  Left: - No evidence of deep vein thrombosis seen in the left lower extremity, from the common femoral through the popliteal veins. - No evidence of superficial venous thrombosis in the left lower extremity. - Venous reflux is noted in the left common femoral vein. - Venous reflux is noted in the left sapheno-femoral junction. - Venous reflux is noted in the left  greater saphenous vein in the thigh. - Venous reflux is noted in the left greater saphenous vein in the calf. - Venous reflux is noted in the left femoral vein.  *See table(s) above for measurements and observations. Electronically signed by Fabienne Bruns MD on 05/23/2021 at 7:41:19 PM.    Final        Assessment & Plan:   Problem List Items Addressed This Visit     Anxiety    Continues on zoloft.  Followed by Dr Maryruth Bun.  Discussed how anxiety can affect her current symptoms.  She has a prescription for lorazepam.  Discussed using prn.  Follow.        Asthma, mild persistent    Breathing overall appears to be stable.  Intolerance to trelegy.       Fatigue    Has had extensive work-up as outlined previously.  Repeat EKG - SR with no acute change.  Check labs as outlined.  Continue compression hose and continue to stay hydrated. Await monitor results.       Urinary frequency    Check urine to confirm no infection.       Relevant Orders   Urinalysis, Routine w reflex microscopic (Completed)   Varicose veins of both lower extremities    Continue compression hose.       Weakness - Primary    Has seen neurology, rheumatology and cardiology.  Also pulmonary evaluation.  Just recently reevaluated by  cardiology.   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  Recently reports worsening of symptoms as outlined.  Repeat EKG today - SR with no acute ischemic changes.  Discussed rechecking labs to confirm no electrolyte abnormality, anemia, etc.  Check urine to confirm no infection.  Continue compression hose.  Continue to stay hydrated.  Wearing monitor.  Discussed continued f/u with psychiatry to help in dealing with above.  Has lorazepam to take prn.  Discussed.        Relevant Orders   EKG 12-Lead (Completed)   CBC with Differential/Platelet (Completed)   Comprehensive metabolic panel (Completed)   TSH (Completed)   VITAMIN D 25 Hydroxy (Vit-D Deficiency, Fractures) (Completed)   Magnesium (Completed)     [place], MD

## 2021-11-22 LAB — CBC WITH DIFFERENTIAL/PLATELET
Basophils Absolute: 0.1 10*3/uL (ref 0.0–0.1)
Basophils Relative: 0.7 % (ref 0.0–3.0)
Eosinophils Absolute: 0 10*3/uL (ref 0.0–0.7)
Eosinophils Relative: 0.1 % (ref 0.0–5.0)
HCT: 40.4 % (ref 36.0–46.0)
Hemoglobin: 12.9 g/dL (ref 12.0–15.0)
Lymphocytes Relative: 20.2 % (ref 12.0–46.0)
Lymphs Abs: 1.8 10*3/uL (ref 0.7–4.0)
MCHC: 32.1 g/dL (ref 30.0–36.0)
MCV: 86.5 fl (ref 78.0–100.0)
Monocytes Absolute: 0.4 10*3/uL (ref 0.1–1.0)
Monocytes Relative: 4.3 % (ref 3.0–12.0)
Neutro Abs: 6.8 10*3/uL (ref 1.4–7.7)
Neutrophils Relative %: 74.7 % (ref 43.0–77.0)
Platelets: 320 10*3/uL (ref 150.0–400.0)
RBC: 4.67 Mil/uL (ref 3.87–5.11)
RDW: 13.4 % (ref 11.5–15.5)
WBC: 9.1 10*3/uL (ref 4.0–10.5)

## 2021-11-22 LAB — URINALYSIS, ROUTINE W REFLEX MICROSCOPIC
Bilirubin Urine: NEGATIVE
Hgb urine dipstick: NEGATIVE
Ketones, ur: NEGATIVE
Leukocytes,Ua: NEGATIVE
Nitrite: NEGATIVE
RBC / HPF: NONE SEEN (ref 0–?)
Specific Gravity, Urine: 1.01 (ref 1.000–1.030)
Total Protein, Urine: NEGATIVE
Urine Glucose: NEGATIVE
Urobilinogen, UA: 0.2 (ref 0.0–1.0)
pH: 7.5 (ref 5.0–8.0)

## 2021-11-22 LAB — MAGNESIUM: Magnesium: 1.9 mg/dL (ref 1.5–2.5)

## 2021-11-22 LAB — COMPREHENSIVE METABOLIC PANEL
ALT: 25 U/L (ref 0–35)
AST: 21 U/L (ref 0–37)
Albumin: 4.7 g/dL (ref 3.5–5.2)
Alkaline Phosphatase: 41 U/L (ref 39–117)
BUN: 7 mg/dL (ref 6–23)
CO2: 27 mEq/L (ref 19–32)
Calcium: 9.6 mg/dL (ref 8.4–10.5)
Chloride: 103 mEq/L (ref 96–112)
Creatinine, Ser: 0.68 mg/dL (ref 0.40–1.20)
GFR: 110.36 mL/min (ref >60.00)
Glucose, Bld: 91 mg/dL (ref 70–99)
Potassium: 3.9 mEq/L (ref 3.5–5.1)
Sodium: 139 mEq/L (ref 135–145)
Total Bilirubin: 0.3 mg/dL (ref 0.2–1.2)
Total Protein: 7.1 g/dL (ref 6.0–8.3)

## 2021-11-22 LAB — VITAMIN D 25 HYDROXY (VIT D DEFICIENCY, FRACTURES): VITD: 36.43 ng/mL (ref 30.00–100.00)

## 2021-11-22 LAB — TSH: TSH: 1 u[IU]/mL (ref 0.35–5.50)

## 2021-11-26 ENCOUNTER — Encounter: Payer: Self-pay | Admitting: Internal Medicine

## 2021-11-26 NOTE — Assessment & Plan Note (Signed)
Has seen neurology, rheumatology and cardiology.  Also pulmonary evaluation.  Just recently reevaluated by cardiology.   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  Recently reports worsening of symptoms as outlined.  Repeat EKG today - SR with no acute ischemic changes.  Discussed rechecking labs to confirm no electrolyte abnormality, anemia, etc.  Check urine to confirm no infection.  Continue compression hose.  Continue to stay hydrated.  Wearing monitor.  Discussed continued f/u with psychiatry to help in dealing with above.  Has lorazepam to take prn.  Discussed.

## 2021-11-26 NOTE — Assessment & Plan Note (Signed)
Breathing overall appears to be stable.  Intolerance to trelegy.  

## 2021-11-26 NOTE — Assessment & Plan Note (Signed)
Continues on zoloft.  Followed by Dr Kapur.  Discussed how anxiety can affect her current symptoms.  She has a prescription for lorazepam.  Discussed using prn.  Follow.   

## 2021-11-26 NOTE — Assessment & Plan Note (Signed)
Check urine to confirm no infection.  

## 2021-11-26 NOTE — Assessment & Plan Note (Signed)
Has had extensive work-up as outlined previously.  Repeat EKG - SR with no acute change.  Check labs as outlined.  Continue compression hose and continue to stay hydrated. Await monitor results.

## 2021-11-26 NOTE — Assessment & Plan Note (Signed)
Continue compression hose.   

## 2021-11-28 ENCOUNTER — Emergency Department (HOSPITAL_COMMUNITY)
Admission: EM | Admit: 2021-11-28 | Discharge: 2021-11-29 | Disposition: A | Discharge status: Home / Self Care | Payer: Self-pay | Attending: Student | Admitting: Student

## 2021-11-28 ENCOUNTER — Emergency Department (HOSPITAL_COMMUNITY): Payer: Self-pay

## 2021-11-28 ENCOUNTER — Encounter (HOSPITAL_COMMUNITY): Payer: Self-pay

## 2021-11-28 DIAGNOSIS — Z5321 Procedure and treatment not carried out due to patient leaving prior to being seen by health care provider: Secondary | ICD-10-CM | POA: Insufficient documentation

## 2021-11-28 DIAGNOSIS — R5381 Other malaise: Secondary | ICD-10-CM | POA: Insufficient documentation

## 2021-11-28 DIAGNOSIS — R079 Chest pain, unspecified: Secondary | ICD-10-CM | POA: Insufficient documentation

## 2021-11-28 DIAGNOSIS — R462 Strange and inexplicable behavior: Secondary | ICD-10-CM | POA: Insufficient documentation

## 2021-11-28 DIAGNOSIS — R202 Paresthesia of skin: Secondary | ICD-10-CM | POA: Insufficient documentation

## 2021-11-28 DIAGNOSIS — R42 Dizziness and giddiness: Secondary | ICD-10-CM | POA: Insufficient documentation

## 2021-11-28 DIAGNOSIS — I10 Essential (primary) hypertension: Secondary | ICD-10-CM | POA: Insufficient documentation

## 2021-11-28 LAB — URINALYSIS, ROUTINE W REFLEX MICROSCOPIC
Bilirubin Urine: NEGATIVE
Glucose, UA: NEGATIVE mg/dL
Hgb urine dipstick: NEGATIVE
Ketones, ur: NEGATIVE mg/dL
Leukocytes,Ua: NEGATIVE
Nitrite: NEGATIVE
Protein, ur: NEGATIVE mg/dL
Specific Gravity, Urine: 1.008 (ref 1.005–1.030)
pH: 8 (ref 5.0–8.0)

## 2021-11-28 LAB — CBC WITH DIFFERENTIAL/PLATELET
Abs Immature Granulocytes: 0.02 10*3/uL (ref 0.00–0.07)
Basophils Absolute: 0 10*3/uL (ref 0.0–0.1)
Basophils Relative: 1 %
Eosinophils Absolute: 0 10*3/uL (ref 0.0–0.5)
Eosinophils Relative: 0 %
HCT: 39.8 % (ref 36.0–46.0)
Hemoglobin: 12.9 g/dL (ref 12.0–15.0)
Immature Granulocytes: 0 %
Lymphocytes Relative: 26 %
Lymphs Abs: 2.2 10*3/uL (ref 0.7–4.0)
MCH: 28.7 pg (ref 26.0–34.0)
MCHC: 32.4 g/dL (ref 30.0–36.0)
MCV: 88.4 fL (ref 80.0–100.0)
Monocytes Absolute: 0.4 10*3/uL (ref 0.1–1.0)
Monocytes Relative: 4 %
Neutro Abs: 5.8 10*3/uL (ref 1.7–7.7)
Neutrophils Relative %: 69 %
Platelets: 294 10*3/uL (ref 150–400)
RBC: 4.5 MIL/uL (ref 3.87–5.11)
RDW: 13.2 % (ref 11.5–15.5)
WBC: 8.4 10*3/uL (ref 4.0–10.5)
nRBC: 0 % (ref 0.0–0.2)

## 2021-11-28 LAB — TROPONIN I (HIGH SENSITIVITY): Troponin I (High Sensitivity): 4 ng/L (ref <18)

## 2021-11-28 LAB — BASIC METABOLIC PANEL
Anion gap: 9 (ref 5–15)
BUN: 5 mg/dL — ABNORMAL LOW (ref 6–20)
CO2: 22 mmol/L (ref 22–32)
Calcium: 9.1 mg/dL (ref 8.9–10.3)
Chloride: 107 mmol/L (ref 98–111)
Creatinine, Ser: 0.7 mg/dL (ref 0.44–1.00)
GFR, Estimated: >60 mL/min (ref >60)
Glucose, Bld: 97 mg/dL (ref 70–99)
Potassium: 3.9 mmol/L (ref 3.5–5.1)
Sodium: 138 mmol/L (ref 135–145)

## 2021-11-28 NOTE — ED Triage Notes (Addendum)
Patient ambulatory to dept c/o dizziness, tingling, malaise, and htn for "awhile". Patient reports BPs at home of 203/167 to 111/84. Patient reports feeling"funny" endorses "thumps and chest pain"

## 2021-11-28 NOTE — ED Provider Triage Note (Signed)
Emergency Medicine Provider Triage Evaluation Note  Tonya Harris , a 41 y.o. female  was evaluated in triage.  Pt complains of "feeling funny."  In the past she has had episodes of feeling funny.  She is somewhat unable to describe it.  Endorses hand shaking, extremity weakness.  She talked to her doctor about this and he told her that if this happens again she needs to be evaluated.  Today she was playing with her kids and she got one of the episodes of "feeling funny."  She took her blood pressure at that time and noted it to be elevated to 190s over 120s.  Retook it and got 120s over 80s.  Retook it again and got 200s over 180s.  Wanted to be safe and get fully checked out.  Review of Systems  Positive:  Negative:   Physical Exam  BP 132/87    Pulse 92    Temp 98.9 F (37.2 C) (Oral)    Resp 16    LMP 10/31/2021 (Within Weeks)    SpO2 99%  Gen:   Awake, no distress   Resp:  Normal effort  MSK:   Moves extremities without difficulty  Other:  RRR, CTAB  Medical Decision Making  Medically screening exam initiated at 8:32 PM.  Appropriate orders placed.  Tonya Harris was informed that the remainder of the evaluation will be completed by another provider, this initial triage assessment does not replace that evaluation, and the importance of remaining in the ED until their evaluation is complete.  Very anxious and needing reassurance at this time.   Saddie Benders, PA-C 11/28/21 2042

## 2021-11-29 ENCOUNTER — Ambulatory Visit: Admit: 2021-11-29 | Discharge status: Against Medical Advice | Payer: Self-pay

## 2021-11-29 ENCOUNTER — Encounter: Payer: Self-pay | Admitting: Internal Medicine

## 2021-11-29 ENCOUNTER — Emergency Department (HOSPITAL_COMMUNITY): Payer: Self-pay

## 2021-11-29 ENCOUNTER — Telehealth: Payer: Self-pay | Admitting: Internal Medicine

## 2021-11-29 ENCOUNTER — Emergency Department (HOSPITAL_COMMUNITY)
Admission: EM | Admit: 2021-11-29 | Discharge: 2021-11-29 | Disposition: A | Discharge status: Home / Self Care | Payer: Self-pay | Attending: Emergency Medicine | Admitting: Emergency Medicine

## 2021-11-29 ENCOUNTER — Encounter (HOSPITAL_COMMUNITY): Payer: Self-pay | Admitting: Emergency Medicine

## 2021-11-29 ENCOUNTER — Other Ambulatory Visit: Payer: Self-pay

## 2021-11-29 DIAGNOSIS — R079 Chest pain, unspecified: Secondary | ICD-10-CM

## 2021-11-29 DIAGNOSIS — I10 Essential (primary) hypertension: Secondary | ICD-10-CM | POA: Insufficient documentation

## 2021-11-29 DIAGNOSIS — Z8679 Personal history of other diseases of the circulatory system: Secondary | ICD-10-CM

## 2021-11-29 DIAGNOSIS — R42 Dizziness and giddiness: Secondary | ICD-10-CM | POA: Insufficient documentation

## 2021-11-29 DIAGNOSIS — R350 Frequency of micturition: Secondary | ICD-10-CM | POA: Insufficient documentation

## 2021-11-29 LAB — URINALYSIS, ROUTINE W REFLEX MICROSCOPIC
Bilirubin Urine: NEGATIVE
Glucose, UA: NEGATIVE mg/dL
Hgb urine dipstick: NEGATIVE
Ketones, ur: NEGATIVE mg/dL
Leukocytes,Ua: NEGATIVE
Nitrite: NEGATIVE
Protein, ur: NEGATIVE mg/dL
Specific Gravity, Urine: 1.008 (ref 1.005–1.030)
pH: 8 (ref 5.0–8.0)

## 2021-11-29 LAB — CBC WITH DIFFERENTIAL/PLATELET
Abs Immature Granulocytes: 0.03 10*3/uL (ref 0.00–0.07)
Basophils Absolute: 0 10*3/uL (ref 0.0–0.1)
Basophils Relative: 1 %
Eosinophils Absolute: 0 10*3/uL (ref 0.0–0.5)
Eosinophils Relative: 0 %
HCT: 42.2 % (ref 36.0–46.0)
Hemoglobin: 13.6 g/dL (ref 12.0–15.0)
Immature Granulocytes: 0 %
Lymphocytes Relative: 20 %
Lymphs Abs: 1.7 10*3/uL (ref 0.7–4.0)
MCH: 28.6 pg (ref 26.0–34.0)
MCHC: 32.2 g/dL (ref 30.0–36.0)
MCV: 88.8 fL (ref 80.0–100.0)
Monocytes Absolute: 0.4 10*3/uL (ref 0.1–1.0)
Monocytes Relative: 5 %
Neutro Abs: 6.3 10*3/uL (ref 1.7–7.7)
Neutrophils Relative %: 74 %
Platelets: 312 10*3/uL (ref 150–400)
RBC: 4.75 MIL/uL (ref 3.87–5.11)
RDW: 13.3 % (ref 11.5–15.5)
WBC: 8.5 10*3/uL (ref 4.0–10.5)
nRBC: 0 % (ref 0.0–0.2)

## 2021-11-29 LAB — BASIC METABOLIC PANEL
Anion gap: 6 (ref 5–15)
BUN: 5 mg/dL — ABNORMAL LOW (ref 6–20)
CO2: 25 mmol/L (ref 22–32)
Calcium: 9.4 mg/dL (ref 8.9–10.3)
Chloride: 107 mmol/L (ref 98–111)
Creatinine, Ser: 0.74 mg/dL (ref 0.44–1.00)
GFR, Estimated: >60 mL/min (ref >60)
Glucose, Bld: 103 mg/dL — ABNORMAL HIGH (ref 70–99)
Potassium: 4 mmol/L (ref 3.5–5.1)
Sodium: 138 mmol/L (ref 135–145)

## 2021-11-29 LAB — TROPONIN I (HIGH SENSITIVITY): Troponin I (High Sensitivity): 3 ng/L (ref <18)

## 2021-11-29 LAB — I-STAT BETA HCG BLOOD, ED (MC, WL, AP ONLY): I-stat hCG, quantitative: <5 m[IU]/mL (ref <5)

## 2021-11-29 NOTE — ED Provider Notes (Signed)
[facility] EMERGENCY DEPARTMENT Provider Note   CSN: [id number] Arrival date & time: 11/29/21  1232     History  Chief Complaint  Patient presents with   Hypertension    Tonya Harris is a 41 y.o. female who presents to the emergency department complaining of high blood pressure, dizziness, fatigue, and frequent urination.  She states that she began feeling this way yesterday, and was seen in ER triage last night but left without being seen.  She states she just finished wearing a Holter monitor, but has not dropped this off or seen the cardiologist yet.  She reports for the past several days having multiple episodes of "feeling funny".  When this happens she checks her blood pressure and intermittently has readings of systolics in the [id number].  When this happens she states she has intermittent shortness of breath as well as chest pressure, but this all resolves when she is able to relax for several minutes.   Hypertension Associated symptoms include headaches and shortness of breath. Pertinent negatives include no abdominal pain.      Home Medications Prior to Admission medications   Medication Sig Start Date End Date Taking? Authorizing Provider  magnesium oxide (MAG-OX) 400 MG tablet TAKE 1 TABLET BY MOUTH EVERY DAY Patient taking differently: Take 400 mg by mouth daily. 10/13/21   Einar Pheasant, MD  Multiple Vitamin (MULTIVITAMIN) tablet Take 1 tablet by mouth daily.    [provider]  pantoprazole (PROTONIX) 40 MG tablet TAKE 1 TABLET BY MOUTH EVERY DAY Patient taking differently: 40 mg daily. 03/11/21   Einar Pheasant, MD  sertraline (ZOLOFT) 100 MG tablet Take 50 mg by mouth daily.    [provider]      Allergies    Patient has no known allergies.    Review of Systems   Review of Systems  Constitutional:  Negative for chills and fever.  Respiratory:  Positive for chest tightness and shortness of breath. Negative for cough.    Cardiovascular:  Negative for leg swelling.  Gastrointestinal:  Negative for abdominal pain, constipation, diarrhea, nausea and vomiting.  Genitourinary:  Positive for frequency. Negative for dysuria, flank pain, hematuria and urgency.  Musculoskeletal:  Negative for myalgias.  Neurological:  Positive for headaches. Negative for syncope and light-headedness.  Psychiatric/Behavioral:  The patient is nervous/anxious.   All other systems reviewed and are negative.  Physical Exam Updated Vital Signs BP 119/75 (BP Location: Right Arm)    Pulse 87    Temp 98 F (36.7 C) (Oral)    Resp 14    LMP 10/31/2021 (Within Weeks)    SpO2 98%  Physical Exam Vitals and nursing note reviewed.  Constitutional:      Appearance: Normal appearance.  HENT:     Head: Normocephalic and atraumatic.  Eyes:     Conjunctiva/sclera: Conjunctivae normal.  Cardiovascular:     Rate and Rhythm: Normal rate and regular rhythm.  Pulmonary:     Effort: Pulmonary effort is normal. No respiratory distress.     Breath sounds: Normal breath sounds.  Abdominal:     General: There is no distension.     Palpations: Abdomen is soft.     Tenderness: There is no abdominal tenderness.  Skin:    General: Skin is warm and dry.  Neurological:     General: No focal deficit present.     Mental Status: She is alert.    ED Results / Procedures / Treatments   Labs (all  labs ordered are listed, but only abnormal results are displayed) Labs Reviewed  BASIC METABOLIC PANEL - Abnormal; Notable for the following components:      Result Value   Glucose, Bld 103 (*)    BUN 5 (*)    All other components within normal limits  URINALYSIS, ROUTINE W REFLEX MICROSCOPIC - Abnormal; Notable for the following components:   Color, Urine STRAW (*)    All other components within normal limits  CBC WITH DIFFERENTIAL/PLATELET  I-STAT BETA HCG BLOOD, ED ([facility], [facility], [facility] ONLY)    EKG None  Radiology DG Chest 2 View  Result Date:  11/28/2021 CLINICAL DATA:  Chest pain. EXAM: CHEST - 2 VIEW COMPARISON:  Chest radiograph dated 09/13/2020. FINDINGS: The heart size and mediastinal contours are within normal limits. Both lungs are clear. The visualized skeletal structures are unremarkable. IMPRESSION: No active cardiopulmonary disease. Electronically Signed   By: Anner Crete M.D.   On: 11/28/2021 21:55   CT Head Wo Contrast  Result Date: 11/29/2021 CLINICAL DATA:  Sudden onset headache. Hypertension and dizziness. Fatigue. EXAM: CT HEAD WITHOUT CONTRAST TECHNIQUE: Contiguous axial images were obtained from the base of the skull through the vertex without intravenous contrast. RADIATION DOSE REDUCTION: This exam was performed according to the departmental dose-optimization program which includes automated exposure control, adjustment of the mA and/or kV according to patient size and/or use of iterative reconstruction technique. COMPARISON:  MRI brain 05/17/2021 FINDINGS: Brain: The brainstem, cerebellum, cerebral peduncles, thalami, basal ganglia, basilar cisterns, and ventricular system appear within normal limits. No intracranial hemorrhage, mass lesion, or acute CVA. Vascular: Unremarkable Skull: Unremarkable Sinuses/Orbits: Unremarkable Other: Stable right vertex scalp lesion, probably a sebaceous cyst or similar benign lesion. IMPRESSION: 1. No significant intracranial abnormality identified. Electronically Signed   By: Van Clines M.D.   On: 11/29/2021 14:32    Procedures Procedures    Medications Ordered in ED Medications - No data to display  ED Course/ Medical Decision Making/ A&P                           Medical Decision Making  This patient presents to the ED for concern of chest pressure and elevated blood pressure, this involves an extensive number of treatment options, and is a complaint that carries with it a high risk of complications and morbidity. The emergent differential diagnosis includes, but is  not limited to,  The emergent causes of chest pain include: Acute coronary syndrome, tamponade, pericarditis/myocarditis, aortic dissection, pulmonary embolism, tension pneumothorax, pneumonia, and esophageal rupture.  Viewed labs/imaging results and triage note from patient's visit to ER yesterday.  Physical Exam: Physical exam performed. The pertinent findings include: heart regular rate and rhythm, lungs clear in all fields, no peripheral edema, neurologic exam normal as above.   Lab Tests: I Ordered, and personally interpreted labs.  The pertinent results include:  no leukocytosis, normal hemoglobin, normal kidney function, electrolytes within normal limits, urinalysis negative for UTI, pregnancy test negative. Initial and delta troponins performed yesterday evening negative. As story is not concerning for ACS, elected to not repeat today.    Imaging Studies: I ordered imaging studies including CT head. I independently visualized and interpreted imaging which showed no acute intracranial abnormalities. I agree with the radiologist interpretation. CXR performed yesterday was viewed and showed no acute cardiopulmonary abnormalities.    Dispostion: After consideration of the diagnostic results and the patients response to treatment, I feel that patient is not  requiring admission or inpatient treatment for her symptoms.  Discussed possible etiologies for her symptoms, but I do not believe these are of emergent priority. Patient is to be discharged with recommendation to follow up with PCP in regards to today's hospital visit. Chest pain is not likely of cardiac or pulmonary etiology d/t presentation, PERC negative, VSS, no tracheal deviation, no JVD or new murmur, RRR, breath sounds equal bilaterally, EKG without acute abnormalities, negative troponin, and negative CXR. Pt has been advised to return to the ED if CP becomes exertional, associated with diaphoresis or nausea, radiates to left jaw/arm,  worsens or becomes concerning in any way. Pt appears reliable for follow up and is agreeable to discharge.   Final Clinical Impression(s) / ED Diagnoses Final diagnoses:  History of high blood pressure  Chest pain, unspecified type    Rx / DC Orders ED Discharge Orders     None      Portions of this report may have been transcribed using voice recognition software. Every effort was made to ensure accuracy; however, inadvertent computerized transcription errors may be present.    Estill Cotta 11/29/21 2230    Lacretia Leigh, MD 11/30/21 1003

## 2021-11-29 NOTE — Telephone Encounter (Signed)
Patient has sent my chart and has arrived at ED

## 2021-11-29 NOTE — ED Triage Notes (Signed)
Pt presents to ED POV. Pt c/o HTN, dizziness, fatigue, frequent urination. Pt reports that that she felt the same last night was seen in triage but LWBS.

## 2021-11-29 NOTE — ED Provider Triage Note (Signed)
Emergency Medicine Provider Triage Evaluation Note  Tonya Harris , a 41 y.o. female  was evaluated in triage.  Pt complains of numerous chronic symptoms of headache, numbness/tingling, and elevated blood pressure.  Patient states she has been worked up by her PCP with reassuring results.  Also endorses intermittent chest pain.  Patient reported to the ED yesterday for the same complaint where labs were drawn and troponins however, patient states she began feeling better and left.  Patient states her BP yesterday was elevated in the 200s. She also endorses frequent urination  Review of Systems  Positive: headache Negative: fever  Physical Exam  BP (!) 136/92 (BP Location: Right Arm)    Pulse 93    Temp 98.4 F (36.9 C) (Oral)    Resp 14    LMP 10/31/2021 (Within Weeks)    SpO2 100%  Gen:   Awake, no distress   Resp:  Normal effort  MSK:   Moves extremities without difficulty  Other:  No focal deficits  Medical Decision Making  Medically screening exam initiated at 1:12 PM.  Appropriate orders placed.  Tonya Harris was informed that the remainder of the evaluation will be completed by another provider, this initial triage assessment does not replace that evaluation, and the importance of remaining in the ED until their evaluation is complete.  Repeat labs CT head UA to rule out UTI Delta troponins negative yesterday.   Suzy Bouchard, [place] 11/29/21 1315

## 2021-11-29 NOTE — ED Notes (Signed)
Patient verbalizes understanding of discharge instructions. Follow-up care reviewed. Opportunity for questioning and answers were provided. Armband removed by staff, pt discharged from ED ambulatory.  

## 2021-11-29 NOTE — Telephone Encounter (Signed)
Spoke with patient. She is still feeling bad. Cold, clammy, frequent urination. Feels different than her ongoing concerns. Advised that she needs to be evaluated today. She is going to urgent care and will f/u with you on Thursday

## 2021-11-29 NOTE — Discharge Instructions (Addendum)
You were seen in the emergency department for hypertension and chest pain.  We have taken a look at your labs, your EKG, and the imaging of your head coupled from today and yesterday. We took a look at your heart, lungs, brain, electrolytes, kidney function, etc. and everything looks good from our standpoint, as in, nothing that shows you would need to be admitted to the hospital for evaluation today.  I think it is incredibly important you follow up with your primary doctor about the results of your cardiac monitoring. When you have further episodes, make sure to sit/lay down and relax. Take notes about how long it lasts and what symptoms you're experiencing -- this will help your PCP and cardiologist determine what to do.  Continue to monitor how you're doing and return to the ER for new or worsening symptoms such as persistent chest pain that does not resolve, passing out, etc.   It has been a pleasure seeing and caring for you today and I hope you start feeling better soon!

## 2021-11-29 NOTE — Telephone Encounter (Signed)
Pt called stating that she was talking to cma and she accidentally hung up and was trying to reach her again.

## 2021-11-29 NOTE — ED Notes (Signed)
Patient left.

## 2021-11-29 NOTE — Telephone Encounter (Signed)
Pt called in stating that she is having spells of high blood pressure. Pt stated that she have sent over a mychart message. Copy of Mychart message is below.   Hey Dr Nicki Reaper   I wanted to let you know I had another one of those spellsthis evening.  I had felt bad all day, (also having so much frequent urination, a linger headache that hurts to move, kind of like how we talked about it hurting with straining, weakness, and chest pain, low energy). Around 5:30 I started to feel tingly again- my arms got tingly and I started feeling so weak.  I sat down and took my heart rate and blood pressure.  My blood pressure was 140/89 at first, then went to 152/136 then to 203/167 (see pics).  I was miserable- I layed down and drank some orange juice and eventually got out back to normal then it spiked again to 196/153.   My husband was on his way home from work and I had him take me to [facility] in [place].  My blood pressure had gone back to normal then, they did lab work and a chest x-ray.  I was there until 1 am and it was still 7 more hour wait.  I figured I could let you know when I got home and see if you had any thoughts.  It has felt so scary to me because I dont know what triggers those spells and Im so fearful of them happening again- esp when Im alone etc.     Thanks for your help!   Benjamine Mola

## 2021-12-01 ENCOUNTER — Institutional Professional Consult (permissible substitution): Payer: Self-pay | Admitting: Internal Medicine

## 2021-12-01 ENCOUNTER — Ambulatory Visit (INDEPENDENT_AMBULATORY_CARE_PROVIDER_SITE_OTHER): Payer: Self-pay | Admitting: Internal Medicine

## 2021-12-01 ENCOUNTER — Encounter: Payer: Self-pay | Admitting: Internal Medicine

## 2021-12-01 ENCOUNTER — Other Ambulatory Visit: Payer: Self-pay

## 2021-12-01 DIAGNOSIS — R531 Weakness: Secondary | ICD-10-CM

## 2021-12-01 DIAGNOSIS — R350 Frequency of micturition: Secondary | ICD-10-CM

## 2021-12-01 DIAGNOSIS — R0602 Shortness of breath: Secondary | ICD-10-CM

## 2021-12-01 DIAGNOSIS — R0789 Other chest pain: Secondary | ICD-10-CM

## 2021-12-01 DIAGNOSIS — R030 Elevated blood-pressure reading, without diagnosis of hypertension: Secondary | ICD-10-CM

## 2021-12-02 ENCOUNTER — Encounter: Payer: Self-pay | Admitting: Internal Medicine

## 2021-12-02 NOTE — Assessment & Plan Note (Signed)
Concerned regarding elevated blood pressures - intermittent.  Blood pressure ok today.  After walking, blood pressure 126/80.  Discussed trying to avoid taking her blood pressure multiple times in a short period of time.  Cardiac w/up in progress.  Waiting on monitor results.  Symptoms create increased anxiety. On zoloft.  Discussed using lorazepam if needed.  Discussed relaxation techniques.  Has had extensive cardiac w/up as outlined - has been unrevealing to date.  Discussed possible low dose propranolol to have if needed, but I would like to hold on adding a prn beta blocker until we have more data from her monitor.  Will get Dr Olin Pia input.  Has f/u with Dr Caryl Comes.

## 2021-12-02 NOTE — Assessment & Plan Note (Addendum)
Has the feeling of not being able to get a good breath.  Has seen pulmonary.  Diagnosed with mild asthma.  Intolerance to trelegy.  Control anxiety.  Follow.  No increased cough or congestion.

## 2021-12-02 NOTE — Assessment & Plan Note (Signed)
Persistent.  Urine clear.  Follow.

## 2021-12-02 NOTE — Assessment & Plan Note (Signed)
Has seen neurology, rheumatology and cardiology.  Also pulmonary evaluation.  Just recently reevaluated by cardiology.   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  Recently reports worsening of symptoms as outlined.  Repeat EKG today - SR with no acute ischemic changes.  Labs, including urine - unrevealing.  Continue compression hose.  Continue to stay hydrated.  Await monitor results.  Discussed continued f/u with psychiatry to help in dealing with above.  Has lorazepam to take prn.  Discussed.

## 2021-12-02 NOTE — Progress Notes (Signed)
Patient ID: Tonya CandleElizabeth K Harris, female   DOB: 1980/12/08, 41 y.o.   MRN: [id number]   Subjective:    Patient ID: Tonya CandleElizabeth K Harris, female    DOB: 1980/12/08, 41 y.o.   MRN: [id number]  This visit occurred during the SARS-CoV-2 public health emergency.  Safety protocols were in place, including screening questions prior to the visit, additional usage of staff PPE, and extensive cleaning of exam room while observing appropriate contact time as indicated for disinfecting solutions.   Patient here for work in appt.   Chief Complaint  Patient presents with   Ongoing Concerns   .   HPI Work in with persistent concerns regarding an intermittent "jolt" sensation - chest.  Also will intermittent feels as if she cannot get a good breath in.  Will occasionally feel cold and clammy. She is concerned that there is a nerve problems affecting the muscles of her heart.  States will notice the jolt - occasionally when she bends over.   Trying to walk more.  Eating.  Trying to stay hydrated.  Has noticed increased urination.  No vomiting.  No diarrhea.  Was seen in ER 11/29/21 - with concerns regarding high blood pressure, dizziness, fatigue and frequent urination.  Labs unrevealing.  CT head - no acute abnormality.  CXR - ok.  Recently saw Dr Graciela HusbandsKlein.  Wore zio monitor.  Just mailed.  No results yet.  She is tearful today.  Frustrated with not knowing the etiology of her symptoms.  Sees psychiatry.  On zoloft.  Not taking lorazepam.     Past Medical History:  Diagnosis Date   Anxiety    Dyspnea-episodic    Gestational diabetes    Palpitations    Past Surgical History:  Procedure Laterality Date   APPENDECTOMY     Family History  Problem Relation Age of Onset   Hypertension Mother    Hypertension Father    Social History   Socioeconomic History   Marital status: Married    Spouse name: Not on file   Number of children: 4   Years of education: Not on file   Highest education level: Master's degree  (e.g., MA, MS, MEng, MEd, MSW, MBA)  Occupational History   Occupation: Runner, broadcasting/film/videoTeacher   Occupation: Stay at home mom  Tobacco Use   Smoking status: Never   Smokeless tobacco: Never  Vaping Use   Vaping Use: Never used  Substance and Sexual Activity   Alcohol use: Not Currently   Drug use: Not Currently   Sexual activity: Yes    Partners: Male  Other Topics Concern   Not on file  Social History Narrative   Right handed      Master's degree in teaching       Stay at home mom of 4 children currently   Social Determinants of Health   Financial Resource Strain: Not on file  Food Insecurity: Not on file  Transportation Needs: Not on file  Physical Activity: Not on file  Stress: Not on file  Social Connections: Not on file     Review of Systems  Constitutional:  Positive for fatigue. Negative for appetite change.  HENT:  Negative for congestion and sinus pressure.   Respiratory:  Positive for shortness of breath. Negative for cough.   Cardiovascular:  Negative for palpitations and leg swelling.       Describes the jolt sensation in her chest as outlined.    Gastrointestinal:  Negative for abdominal pain, diarrhea, nausea and vomiting.  Genitourinary:  Positive for frequency. Negative for dysuria.  Musculoskeletal:  Negative for myalgias.       Concerned regarding some intermittent swelling of her fingers.  Also red fingers at times.    Skin:  Positive for color change. Negative for rash.  Neurological:        Some intermittent dizziness/light headedness.    Psychiatric/Behavioral:         Increased stress and anxiety related to her ongoing concerns regarding her symptoms.       Objective:     BP 136/80    Pulse 98    Temp 98.1 F (36.7 C)    Ht 5\' 5"  (1.651 m)    Wt 167 lb 6.4 oz (75.9 kg)    SpO2 99%    BMI 27.86 kg/m  Wt Readings from Last 3 Encounters:  12/01/21 167 lb 6.4 oz (75.9 kg)  11/21/21 167 lb 6.4 oz (75.9 kg)  11/14/21 168 lb 9.6 oz (76.5 kg)    Physical  Exam Vitals reviewed.  Constitutional:      General: She is not in acute distress.    Appearance: Normal appearance.  HENT:     Head: Normocephalic and atraumatic.     Right Ear: External ear normal.     Left Ear: External ear normal.  Eyes:     General: No scleral icterus.       Right eye: No discharge.        Left eye: No discharge.     Conjunctiva/sclera: Conjunctivae normal.  Neck:     Thyroid: No thyromegaly.  Cardiovascular:     Rate and Rhythm: Normal rate and regular rhythm.  Pulmonary:     Effort: No respiratory distress.     Breath sounds: Normal breath sounds. No wheezing.  Abdominal:     General: Bowel sounds are normal.     Palpations: Abdomen is soft.     Tenderness: There is no abdominal tenderness.  Musculoskeletal:        General: No swelling or tenderness.     Cervical back: Neck supple. No tenderness.  Lymphadenopathy:     Cervical: No cervical adenopathy.  Skin:    Findings: No erythema or rash.  Neurological:     Mental Status: She is alert.  Psychiatric:        Mood and Affect: Mood normal.        Behavior: Behavior normal.     Outpatient Encounter Medications as of 12/01/2021  Medication Sig   magnesium oxide (MAG-OX) 400 MG tablet TAKE 1 TABLET BY MOUTH EVERY DAY (Patient taking differently: Take 400 mg by mouth daily.)   Multiple Vitamin (MULTIVITAMIN) tablet Take 1 tablet by mouth daily.   pantoprazole (PROTONIX) 40 MG tablet TAKE 1 TABLET BY MOUTH EVERY DAY (Patient taking differently: 40 mg daily.)   sertraline (ZOLOFT) 100 MG tablet Take 50 mg by mouth daily.   No facility-administered encounter medications on file as of 12/01/2021.     Lab Results  Component Value Date   WBC 8.5 11/29/2021   HGB 13.6 11/29/2021   HCT 42.2 11/29/2021   PLT 312 11/29/2021   GLUCOSE 103 (H) 11/29/2021   ALT 25 11/21/2021   AST 21 11/21/2021   NA 138 11/29/2021   K 4.0 11/29/2021   CL 107 11/29/2021   CREATININE 0.74 11/29/2021   BUN 5 (L)  11/29/2021   CO2 25 11/29/2021   TSH 1.00 11/21/2021    CT Head Wo Contrast  Result  Date: 11/29/2021 CLINICAL DATA:  Sudden onset headache. Hypertension and dizziness. Fatigue. EXAM: CT HEAD WITHOUT CONTRAST TECHNIQUE: Contiguous axial images were obtained from the base of the skull through the vertex without intravenous contrast. RADIATION DOSE REDUCTION: This exam was performed according to the departmental dose-optimization program which includes automated exposure control, adjustment of the mA and/or kV according to patient size and/or use of iterative reconstruction technique. COMPARISON:  MRI brain 05/17/2021 FINDINGS: Brain: The brainstem, cerebellum, cerebral peduncles, thalami, basal ganglia, basilar cisterns, and ventricular system appear within normal limits. No intracranial hemorrhage, mass lesion, or acute CVA. Vascular: Unremarkable Skull: Unremarkable Sinuses/Orbits: Unremarkable Other: Stable right vertex scalp lesion, probably a sebaceous cyst or similar benign lesion. IMPRESSION: 1. No significant intracranial abnormality identified. Electronically Signed   By: Gaylyn Rong M.D.   On: 11/29/2021 14:32       Assessment & Plan:   Problem List Items Addressed This Visit     Chest discomfort    Has had extensive cardiac w/up -  Including a CPX test with a normal heart rate response, stress echo, cardiac MRI, pulmonary function testing and monitors. 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  DR Graciela Husbands - zio monitor.  She has just mailed.  Results are not back yet.         Elevated blood pressure reading    Concerned regarding elevated blood pressures - intermittent.  Blood pressure ok today.  After walking, blood pressure 126/80.  Discussed trying to avoid taking her blood pressure multiple times in a short period of time.  Cardiac w/up in progress.  Waiting on monitor results.  Symptoms create increased anxiety. On zoloft.  Discussed using lorazepam if needed.   Discussed relaxation techniques.  Has had extensive cardiac w/up as outlined - has been unrevealing to date.  Discussed possible low dose propranolol to have if needed, but I would like to hold on adding a prn beta blocker until we have more data from her monitor.  Will get Dr Odessa Fleming input.  Has f/u with Dr Graciela Husbands.       SOB (shortness of breath)    Has the feeling of not being able to get a good breath.  Has seen pulmonary.  Diagnosed with mild asthma.  Intolerance to trelegy.  Control anxiety.  Follow.  No increased cough or congestion.       Urinary frequency    Persistent.  Urine clear.  Follow.        Weakness    Has seen neurology, rheumatology and cardiology.  Also pulmonary evaluation.  Just recently reevaluated by cardiology.   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  Recently reports worsening of symptoms as outlined.  Repeat EKG today - SR with no acute ischemic changes.  Labs, including urine - unrevealing.  Continue compression hose.  Continue to stay hydrated.  Await monitor results.  Discussed continued f/u with psychiatry to help in dealing with above.  Has lorazepam to take prn.  Discussed.         I spent over 45 minutes with the patient and more than 50% of the time was spent in consultation regarding the above.   Time spent discussing her current symptoms and concerns.  Time also spent discussing further evaluation, treatment and plans for further w/up.   Dale [place], MD

## 2021-12-02 NOTE — Assessment & Plan Note (Addendum)
Has had extensive cardiac w/up -  Including a CPX test with a normal heart rate response, stress echo, cardiac MRI, pulmonary function testing and monitors. 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  DR Graciela Husbands - zio monitor.  She has just mailed.  Results are not back yet.

## 2021-12-05 ENCOUNTER — Telehealth: Payer: Self-pay | Admitting: Internal Medicine

## 2021-12-05 NOTE — Telephone Encounter (Signed)
Attempted to call the patient. No answer- I left a message to please call back.  

## 2021-12-05 NOTE — Telephone Encounter (Signed)
I spoke with the patient. She advised that at her December appointment with Dr. Graciela Husbands, she was experiencing weakness/ SOB. She did wear a heart monitor that she turned in on 11/26/21 and this has just become available over the weekend for  Dr. Graciela Husbands to review. Forwarded to his in-basket.   The patient called today reporting that she has had a few episodes of arms/ legs tingling. That happened most recently this past Saturday and she drank fluids to see if this would help with her symptoms.   The patient states she recently had a BP reading of 196/153 (date not confirmed).  She states on 11/28/21, her BP was 140/89, then 152/136, then 202/167, then down to 126/74.  These readings were all taken within a 30 minute time frame.   She was not feeling well at the time these readings were taken.  On 11/29/21, she was again not feeling well- BP was 144/133 & Heart rate were "elevated."  The patient reports that while wearing her heart monitor, she would only press the button when she felt a "jolt" of breathlessness, but no associated BP checks at that time.   The patient did follow up with Dr. Lorin Picket on 12/01/21 and per the patient, Dr. Lorin Picket was to forward a message to Dr. Graciela Husbands asking for his input on the patient's blood pressure.  The patient was mainly calling today to see if her heart monitor had been reviewed and if her follow up appointment could be moved up from 12/22/21.  I have advised the patient that Dr. Graciela Husbands was out of the office at the end of last week, so he most likely has not reviewed any notes from Dr. Lorin Picket. She is also aware that her monitor just became available to [place] over the weekend and this has been uploaded for Dr. Graciela Husbands to review.   The patient is aware I will review all of the above with Dr. Graciela Husbands when he is back in the office tomorrow and call her back with MD recommendations. She voices understanding and is agreeable.

## 2021-12-05 NOTE — Telephone Encounter (Signed)
Patient states she had an episode where her BP was 196/153 and saw her PCP last week.   Pt c/o BP issue: STAT if pt c/o blurred vision, one-sided weakness or slurred speech  1. What are your last 5 BP readings?  Last week 196/153 She knows it was high, but doesn't have readings.   2. Are you having any other symptoms (ex. Dizziness, headache, blurred vision, passed out)? Bad headache, arms tingly during the episodes where her BP was elevated. Patient states she has ongoing SOB  3. What is your BP issue? Elevated  Patient states she wore a monitor and has not received the results. Monitor was removed on the 21st.  Patient is scheduled with Dr. Graciela Husbands on 2/16

## 2021-12-07 ENCOUNTER — Encounter: Payer: Self-pay | Admitting: Internal Medicine

## 2021-12-08 NOTE — Telephone Encounter (Signed)
Tonya Harris, I do not know if I have any specific thoughts about her blood pressure; it turns out that most of her transmissions i.e. greater than 95% of them are associated with PVCs and I will call her and talk to her about putting her on low-dose flecainide.  I think a beta-blocker or calcium blocker for her blood pressure would be reasonable and that may help the flecainide adjunctively.  Call if you have a question

## 2021-12-08 NOTE — Telephone Encounter (Signed)
I talked to Dr Caryl Comes.  See if she is agreeable to do a work in virtual appt at 8:00 tomorrow (Friday - 12/09/21).

## 2021-12-08 NOTE — Telephone Encounter (Signed)
Pt scheduled  

## 2021-12-09 ENCOUNTER — Telehealth: Payer: Self-pay | Admitting: Internal Medicine

## 2021-12-09 ENCOUNTER — Telehealth (INDEPENDENT_AMBULATORY_CARE_PROVIDER_SITE_OTHER): Payer: Self-pay | Admitting: Internal Medicine

## 2021-12-09 VITALS — Ht 65.0 in | Wt 167.0 lb

## 2021-12-09 DIAGNOSIS — R0602 Shortness of breath: Secondary | ICD-10-CM

## 2021-12-09 DIAGNOSIS — R0789 Other chest pain: Secondary | ICD-10-CM

## 2021-12-09 DIAGNOSIS — R350 Frequency of micturition: Secondary | ICD-10-CM

## 2021-12-09 NOTE — Telephone Encounter (Signed)
Lft pt vm to call ofc to sch CT cardiac scoring. thanks

## 2021-12-09 NOTE — Telephone Encounter (Signed)
She is scheduled for a follow up virtual visit with me today 12/09/21.  Her most recent request is that she wants another test to look at her heart.  As we discussed, I was going to talk to her about - CT (calcium score).  I was also planning to discuss the possibility of starting a low dose beta blocker.  Given her pressures run lower at times, possibly propranolol (either scheduled doses or to have prn).  Would appreciate your thoughts regarding choice of beta blocker and dosing.  Thank you for your help.

## 2021-12-09 NOTE — Progress Notes (Signed)
Patient ID: Tonya Harris, female   DOB: [birth year], 41 y.o.   MRN: [id number]   Virtual Visit via video Note  This visit type was conducted due to national recommendations for restrictions regarding the COVID-19 pandemic (e.g. social distancing).  This format is felt to be most appropriate for this patient at this time.  All issues noted in this document were discussed and addressed.  No physical exam was performed (except for noted visual exam findings with Video Visits).   I connected with Tonya Harris by a video enabled telemedicine application and verified that I am speaking with the correct person using two identifiers. Location patient: home Location provider: work  Persons participating in the virtual visit: patient, provider  The limitations, risks, security and privacy concerns of performing an evaluation and management service by video and the availability of in person appointments have been discussed.  It has also been discussed with the patient that there may be a patient responsible charge related to this service. The patient expressed understanding and agreed to proceed.   Reason for visit: work in appt.    HPI: Work in to discuss her continued symptoms and plans for f/u.  Persistent sob and chest tightness as outlined previously.  Has noticed symptoms worse - with bending/standing.  Still with increased frequent urination.  When shifts positions, notices change in sensation - head.  When tries to take a deep breath - states not satisfying.  Discussed f/u with Dr Graciela Husbands.  Discussed with Dr Graciela Husbands - possibility of starting her on low dose beta blocker or flecainide (per Dr Graciela Husbands).  Symptoms appear to correlate with PVCs.  Discussed calcium score.  Eating.  Discussed increased stress and anxiety related to above.  Discussed taking lorazepam.  She has not taken.    ROS: See pertinent positives and negatives per HPI.  Past Medical History:  Diagnosis Date   Anxiety     Dyspnea-episodic    Gestational diabetes    Palpitations     Past Surgical History:  Procedure Laterality Date   APPENDECTOMY      Family History  Problem Relation Age of Onset   Hypertension Mother    Hypertension Father     SOCIAL HX: reviewed.    Current Outpatient Medications:    magnesium oxide (MAG-OX) 400 MG tablet, TAKE 1 TABLET BY MOUTH EVERY DAY (Patient taking differently: Take 400 mg by mouth daily.), Disp: 90 tablet, Rfl: 1   Multiple Vitamin (MULTIVITAMIN) tablet, Take 1 tablet by mouth daily., Disp: , Rfl:    pantoprazole (PROTONIX) 40 MG tablet, TAKE 1 TABLET BY MOUTH EVERY DAY (Patient taking differently: 40 mg daily.), Disp: 90 tablet, Rfl: 1   sertraline (ZOLOFT) 100 MG tablet, Take 50 mg by mouth daily., Disp: , Rfl:   EXAM:  GENERAL: alert, oriented, appears well and in no acute distress  HEENT: atraumatic, conjunttiva clear, no obvious abnormalities on inspection of external nose and ears  NECK: normal movements of the head and neck  LUNGS: on inspection no signs of respiratory distress, breathing rate appears normal, no obvious gross SOB, gasping or wheezing  CV: no obvious cyanosis  PSYCH/NEURO: pleasant and cooperative, no obvious depression or anxiety, speech and thought processing grossly intact  ASSESSMENT AND PLAN:  Discussed the following assessment and plan:  Problem List Items Addressed This Visit     Chest discomfort - Primary    Has had extensive cardiac w/up previously  -  Including a CPX test with a normal  heart rate response, stress echo, cardiac MRI, pulmonary function testing and monitors. 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  DR Graciela Husbands - zio monitor.  Discussed with Dr Graciela Husbands.  He plans to discuss with her regarding possibility of low dose flecainide.  Discussed obtaining calcium score.  She is in agreement.          Relevant Orders   CT CARDIAC SCORING (SELF PAY ONLY)   SOB (shortness of breath)    Has the  feeling of not being able to get a good breath.  Has seen pulmonary.  Diagnosed with mild asthma.  Intolerance to trelegy.  Control anxiety.  Follow.  No increased cough or congestion. Plan for calcium score as outlined.       Urinary frequency    Persistent.  No evidence of infection.  Follow.        Return if symptoms worsen or fail to improve, for keep scheduled.   I discussed the assessment and treatment plan with the patient. The patient was provided an opportunity to ask questions and all were answered. The patient agreed with the plan and demonstrated an understanding of the instructions.   The patient was advised to call back or seek an in-person evaluation if the symptoms worsen or if the condition fails to improve as anticipated.  I spent 25 minutes with the patient and more than 50% of the time was spent in consultation regarding the above.  Time spent discussing current symptoms and concerns.  Time also spent discussing plans for further w/up and evaluation.      Dale [place], MD

## 2021-12-10 ENCOUNTER — Encounter: Payer: Self-pay | Admitting: Internal Medicine

## 2021-12-10 NOTE — Progress Notes (Unsigned)
Tried to call Pt and got VM

## 2021-12-11 ENCOUNTER — Encounter: Payer: Self-pay | Admitting: Internal Medicine

## 2021-12-11 NOTE — Assessment & Plan Note (Signed)
Has the feeling of not being able to get a good breath.  Has seen pulmonary.  Diagnosed with mild asthma.  Intolerance to trelegy.  Control anxiety.  Follow.  No increased cough or congestion. Plan for calcium score as outlined.

## 2021-12-11 NOTE — Assessment & Plan Note (Signed)
Persistent.  No evidence of infection.  Follow.

## 2021-12-11 NOTE — Assessment & Plan Note (Signed)
Has had extensive cardiac w/up previously  -  Including a CPX test with a normal heart rate response, stress echo, cardiac MRI, pulmonary function testing and monitors. 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  DR Graciela Husbands - zio monitor.  Discussed with Dr Graciela Husbands.  He plans to discuss with her regarding possibility of low dose flecainide.  Discussed obtaining calcium score.  She is in agreement.

## 2021-12-12 MED ORDER — FLECAINIDE ACETATE 50 MG PO TABS: 25.0000 mg | ORAL_TABLET | Freq: Two times a day (BID) | ORAL | 6 refills | Status: DC

## 2021-12-12 NOTE — Telephone Encounter (Signed)
Attempted to contact the patient. No answer- I left a message I will try to call her back in a bit, but she can feel free to contact me in the interim.

## 2021-12-12 NOTE — Telephone Encounter (Signed)
Tonya Sprang, MD  Emily Filbert, RN [name], good morning. I hope you are [place]. I tried to call this lady on Saturday, about her event recorder which showed that out of 105 or 10 triggers, 95+ percent of them were associated with PVCs; I think thats probably a likely explanation for her jolt . I would like to try her on low-dose flecainide at 25 mg twice a day. Theres also issue according to [employer] about that hypertension but would like to try one thing at a time  Thx  SK

## 2021-12-12 NOTE — Telephone Encounter (Signed)
I spoke with the patient regarding her heart monitor results and Dr. Odessa Fleming recommendations to:  1) START Flecainide 25 mg: Take 1 tablet by mouth TWICE daily   The patient voices understanding of her results. We discussed her PVC's/ symptoms. She is also currently scheduled for a CT Calcium Score per Dr. Lorin Picket. We have discussed the role of CT Calcium scoring as well.   All of the patient's questions were answered to the best of my ability. She is agreeable with trying flecainide 25 mg BID for the next 10 days/ until the time of her follow up with Dr. Graciela Husbands on 12/22/21.  I have advised the patient that hopefully with her starting on flecainide now, we will have some understanding at the time of her appointment if this medication is of any benefit to her.   She is aware she may call back with any questions/ concerns in the interim. The patient voices understanding and is agreeable.

## 2021-12-12 NOTE — Telephone Encounter (Signed)
Patient calling to discuss results.  

## 2021-12-12 NOTE — Telephone Encounter (Signed)
Attempted to call the patient. No answer- I left a message to please call back to discuss the results of her heart monitor (ok per DPR).

## 2021-12-13 ENCOUNTER — Encounter: Payer: Self-pay | Admitting: Internal Medicine

## 2021-12-14 ENCOUNTER — Ambulatory Visit
Admission: RE | Admit: 2021-12-14 | Discharge: 2021-12-14 | Disposition: A | Discharge status: Home / Self Care | Payer: Self-pay | Source: Ambulatory Visit | Attending: Internal Medicine | Admitting: Internal Medicine

## 2021-12-14 ENCOUNTER — Other Ambulatory Visit: Payer: Self-pay

## 2021-12-14 DIAGNOSIS — R0789 Other chest pain: Secondary | ICD-10-CM | POA: Insufficient documentation

## 2021-12-16 NOTE — Telephone Encounter (Signed)
In reviewing, the calcium score was zero. Per note, Dr Graciela Husbands has just reached out to her to start her on a new medication.  Would like to see if this makes a difference.  Keep f/u with Dr Graciela Husbands. If any acute change, will need to be evaluated.

## 2021-12-22 ENCOUNTER — Encounter: Payer: Self-pay | Admitting: Internal Medicine

## 2021-12-22 ENCOUNTER — Other Ambulatory Visit: Payer: Self-pay

## 2021-12-22 ENCOUNTER — Telehealth: Payer: Self-pay | Admitting: Internal Medicine

## 2021-12-22 ENCOUNTER — Ambulatory Visit (INDEPENDENT_AMBULATORY_CARE_PROVIDER_SITE_OTHER): Payer: Self-pay | Admitting: Internal Medicine

## 2021-12-22 VITALS — BP 120/76 | HR 90 | Ht 65.0 in | Wt 170.2 lb

## 2021-12-22 DIAGNOSIS — R0600 Dyspnea, unspecified: Secondary | ICD-10-CM

## 2021-12-22 DIAGNOSIS — R002 Palpitations: Secondary | ICD-10-CM

## 2021-12-22 DIAGNOSIS — I951 Orthostatic hypotension: Secondary | ICD-10-CM

## 2021-12-22 MED ORDER — FLECAINIDE ACETATE 50 MG PO TABS: 50.0000 mg | ORAL_TABLET | Freq: Two times a day (BID) | ORAL | 6 refills | Status: DC

## 2021-12-22 MED ORDER — BISOPROLOL FUMARATE 5 MG PO TABS: 2.5000 mg | ORAL_TABLET | Freq: Every day | ORAL | 0 refills | Status: DC

## 2021-12-22 MED ORDER — ATENOLOL 25 MG PO TABS: ORAL_TABLET | ORAL | 0 refills | Status: DC

## 2021-12-22 MED ORDER — METOPROLOL SUCCINATE ER 25 MG PO TB24: 25.0000 mg | ORAL_TABLET | Freq: Every day | ORAL | 0 refills | Status: DC

## 2021-12-22 NOTE — Patient Instructions (Signed)
Medication Instructions:  - Your physician has recommended you make the following change in your medication:   1) INCREASE Flecainide 50 mg: - take 1 tablet by mouth twice daily   2) You have been given 3 different beta blockers to try. You may take them in any order, but DO NOT take more than 1 at a time. Please try to give at least 2 weeks on each medication to see what works the best for you. If you decide which 1 you like, please call the office at [phone] so we may send in additional refills for you.  1) Atenolol 25 mg: - take 1 tablet by mouth once daily   2) Metoprolol succinate 25 mg: - take 1 tablet by mouth once daily   3) Bisoprolol 5 mg: - take 0.5 tablet (2.5 mg) by mouth once daily   *If you need a refill on your cardiac medications before your next appointment, please call your pharmacy*   Lab Work: - none ordered  If you have labs (blood work) drawn today and your tests are completely normal, you will receive your results only by: [facility] [place] (if you have MyChart) OR A paper copy in the mail If you have any lab test that is abnormal or we need to change your treatment, we will call you to review the results.   Testing/Procedures: - none ordered   Follow-Up: At [facility], you and your health needs are our priority.  As part of our continuing mission to provide you with exceptional heart care, we have created designated Provider Care Teams.  These Care Teams include your primary Cardiologist (physician) and Advanced Practice Providers (APPs -  Physician Assistants and Nurse Practitioners) who all work together to provide you with the care you need, when you need it.  We recommend signing up for the patient portal called "MyChart".  Sign up information is provided on this After Visit Summary.  MyChart is used to connect with patients for Virtual Visits (Telemedicine).  Patients are able to view lab/test results, encounter notes, upcoming appointments,  etc.  Non-urgent messages can be sent to your provider as well.   To learn more about what you can do with MyChart, go to [email or web address].    Your next appointment:   12 week(s)  The format for your next appointment:   Virtual Visit   Provider:   Virl Axe, MD    Other Instructions N/a

## 2021-12-22 NOTE — Telephone Encounter (Signed)
  Patient Consent for Virtual Visit        Tonya Harris has provided verbal consent on 12/22/2021 for a virtual visit (video or telephone).   CONSENT FOR VIRTUAL VISIT FOR:  Tonya Harris  By participating in this virtual visit I agree to the following:  I hereby voluntarily request, consent and authorize [facility] and its employed or contracted physicians, physician assistants, nurse practitioners or other licensed health care professionals (the Practitioner), to provide me with telemedicine health care services (the "Services") as deemed necessary by the treating Practitioner. I acknowledge and consent to receive the Services by the Practitioner via telemedicine. I understand that the telemedicine visit will involve communicating with the Practitioner through live audiovisual communication technology and the disclosure of certain medical information by electronic transmission. I acknowledge that I have been given the opportunity to request an in-person assessment or other available alternative prior to the telemedicine visit and am voluntarily participating in the telemedicine visit.  I understand that I have the right to withhold or withdraw my consent to the use of telemedicine in the course of my care at any time, without affecting my right to future care or treatment, and that the Practitioner or I may terminate the telemedicine visit at any time. I understand that I have the right to inspect all information obtained and/or recorded in the course of the telemedicine visit and may receive copies of available information for a reasonable fee.  I understand that some of the potential risks of receiving the Services via telemedicine include:  Delay or interruption in medical evaluation due to technological equipment failure or disruption; Information transmitted may not be sufficient (e.g. poor resolution of images) to allow for appropriate medical decision making by the Practitioner;  and/or  In rare instances, security protocols could fail, causing a breach of personal health information.  Furthermore, I acknowledge that it is my responsibility to provide information about my medical history, conditions and care that is complete and accurate to the best of my ability. I acknowledge that Practitioner's advice, recommendations, and/or decision may be based on factors not within their control, such as incomplete or inaccurate data provided by me or distortions of diagnostic images or specimens that may result from electronic transmissions. I understand that the practice of medicine is not an exact science and that Practitioner makes no warranties or guarantees regarding treatment outcomes. I acknowledge that a copy of this consent can be made available to me via my patient portal ([facility]), or I can request a printed copy by calling the office of [facility].    I understand that my insurance will be billed for this visit.   I have read or had this consent read to me. I understand the contents of this consent, which adequately explains the benefits and risks of the Services being provided via telemedicine.  I have been provided ample opportunity to ask questions regarding this consent and the Services and have had my questions answered to my satisfaction. I give my informed consent for the services to be provided through the use of telemedicine in my medical care

## 2021-12-23 ENCOUNTER — Ambulatory Visit: Payer: Self-pay

## 2021-12-23 ENCOUNTER — Telehealth (INDEPENDENT_AMBULATORY_CARE_PROVIDER_SITE_OTHER): Payer: Self-pay | Admitting: Internal Medicine

## 2021-12-23 ENCOUNTER — Encounter: Payer: Self-pay | Admitting: Internal Medicine

## 2021-12-23 DIAGNOSIS — R0602 Shortness of breath: Secondary | ICD-10-CM

## 2021-12-23 DIAGNOSIS — F419 Anxiety disorder, unspecified: Secondary | ICD-10-CM

## 2021-12-23 DIAGNOSIS — R0789 Other chest pain: Secondary | ICD-10-CM

## 2021-12-23 NOTE — Telephone Encounter (Signed)
Please call and confirm ok.  I can work her in Tuesday - 4:00 - virtual visit.

## 2021-12-23 NOTE — Progress Notes (Signed)
Patient ID: Tonya Harris, female   DOB: [birth year], 41 y.o.   MRN: [id number]   Virtual Visit via telephone Note  This visit type was conducted due to national recommendations for restrictions regarding the COVID-19 pandemic (e.g. social distancing).  This format is felt to be most appropriate for this patient at this time.  All issues noted in this document were discussed and addressed.  No physical exam was performed (except for noted visual exam findings with Video Visits).   I connected with [name] by telephone and verified that I am speaking with the correct person using two identifiers. Location patient: home Location provider: work  Persons participating in the telephone visit: patient, provider  The limitations, risks, security and privacy concerns of performing an evaluation and management service by telephone and the availability of in person appointments have been discussed.  It has also been discussed with the patient that there may be a patient responsible charge related to this service. The patient expressed understanding and agreed to proceed.   Reason for visit: work in appt  HPI: Work in to discuss further w/up and evaluation. Has been having persistent sob and chest tightness with previous description of a "jolt" sensation in her chest.  Is being evaluated by Dr Graciela Husbands.  Started on flecainide.  Feels the "jolt" sensation has improved.  Had f/u yesterday with Dr Graciela Husbands.  Flecainide increased to 50mg  bid.  Also given rx for beta blockers to try.  She called in concerned regarding a continued feeling of not getting a good breath.  No increased cough or congestion.  Can occur when sitting still.  Felt better sitting outside on porch.  No nausea or vomiting reported.  Had been trying to be more active.  She request to f/u with cardiology and wants to discuss visit with Dr and question of need for f/u ECHO.  Discussed recent CT - calcium score - reassuring results.      ROS: See pertinent positives and negatives per HPI.  Past Medical History:  Diagnosis Date   Anxiety    Dyspnea-episodic    Gestational diabetes    Palpitations     Past Surgical History:  Procedure Laterality Date   APPENDECTOMY      Family History  Problem Relation Age of Onset   Hypertension Mother    Hypertension Father     SOCIAL HX: reviewed.    Current Outpatient Medications:    atenolol (TENORMIN) 25 MG tablet, Take 1 tablet (25 mg) by mouth once daily, Disp: 30 tablet, Rfl: 0   flecainide (TAMBOCOR) 50 MG tablet, Take 1 tablet (50 mg total) by mouth 2 (two) times daily., Disp: 60 tablet, Rfl: 6   magnesium oxide (MAG-OX) 400 MG tablet, TAKE 1 TABLET BY MOUTH EVERY DAY, Disp: 90 tablet, Rfl: 1   Multiple Vitamin (MULTIVITAMIN) tablet, Take 1 tablet by mouth daily., Disp: , Rfl:    pantoprazole (PROTONIX) 40 MG tablet, TAKE 1 TABLET BY MOUTH EVERY DAY, Disp: 90 tablet, Rfl: 1   sertraline (ZOLOFT) 100 MG tablet, Take 50 mg by mouth daily., Disp: , Rfl:    bisoprolol (ZEBETA) 5 MG tablet, Take 0.5 tablets (2.5 mg total) by mouth daily. (Patient not taking: Reported on 12/23/2021), Disp: 15 tablet, Rfl: 0   metoprolol succinate (TOPROL XL) 25 MG 24 hr tablet, Take 1 tablet (25 mg total) by mouth daily. (Patient not taking: Reported on 12/23/2021), Disp: 30 tablet, Rfl: 0  EXAM:  GENERAL: alert. Sounds to  be in no acute distress.  Answering questions appropriately.   PSYCH/NEURO: pleasant and cooperative, no obvious depression or anxiety, speech and thought processing grossly intact  ASSESSMENT AND PLAN:  Discussed the following assessment and plan:  Problem List Items Addressed This Visit     Anxiety    Continues on zoloft.  Followed by Dr Maryruth Bun.  Have discussed how anxiety can affect her current symptoms.  She has a prescription for lorazepam.  Have discussed using prn. Also has beta blocker to take prn.   Follow.        Chest discomfort    Has had  extensive cardiac w/up previously  -  Including a CPX test with a normal heart rate response, stress echo, cardiac MRI, pulmonary function testing and monitors. 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  DR Graciela Husbands - zio monitor.  Recently started on flecainide.  Dose increased yesterday.  Has helped the "jolt" sensations.  Still with the feeling of not being able to get a good breath.  Request referral back to cardiology to discuss recent visit with Dr Graciela Husbands and to discuss f/u echo, etc.            SOB (shortness of breath)    Has a persistent feeling of not being able to get a good breath.  Has seen pulmonary.  Diagnosed with mild asthma.  Intolerance to trelegy.  Control anxiety.  Recent calcium score - 0.  Seeing Dr Graciela Husbands.  Started on flecainide.  Has beta blocker rx if needed.  Discussed testing and reassuring results.  She request f/u with cardiology to discuss recent evaluation with Dr Graciela Husbands.  Also wants to discuss f/u ECHO.         Return if symptoms worsen or fail to improve, for keep scheduled.   I discussed the assessment and treatment plan with the patient. The patient was provided an opportunity to ask questions and all were answered. The patient agreed with the plan and demonstrated an understanding of the instructions.   The patient was advised to call back or seek an in-person evaluation if the symptoms worsen or if the condition fails to improve as anticipated.  I provided 25 minutes of non-face-to-face time during this encounter.   Dale [place], MD

## 2021-12-23 NOTE — Telephone Encounter (Signed)
Pt scheduled for a virtual with Dr Scott 

## 2021-12-24 ENCOUNTER — Encounter: Payer: Self-pay | Admitting: Internal Medicine

## 2021-12-24 NOTE — Assessment & Plan Note (Signed)
Has a persistent feeling of not being able to get a good breath.  Has seen pulmonary.  Diagnosed with mild asthma.  Intolerance to trelegy.  Control anxiety.  Recent calcium score - 0.  Seeing Dr Graciela Husbands.  Started on flecainide.  Has beta blocker rx if needed.  Discussed testing and reassuring results.  She request f/u with cardiology to discuss recent evaluation with Dr Graciela Husbands.  Also wants to discuss f/u ECHO.

## 2021-12-24 NOTE — Assessment & Plan Note (Signed)
Continues on zoloft.  Followed by Dr Maryruth Bun.  Have discussed how anxiety can affect her current symptoms.  She has a prescription for lorazepam.  Have discussed using prn. Also has beta blocker to take prn.   Follow.

## 2021-12-24 NOTE — Assessment & Plan Note (Signed)
Has had extensive cardiac w/up previously  -  Including a CPX test with a normal heart rate response, stress echo, cardiac MRI, pulmonary function testing and monitors. 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  DR Caryl Comes - zio monitor.  Recently started on flecainide.  Dose increased yesterday.  Has helped the "jolt" sensations.  Still with the feeling of not being able to get a good breath.  Request referral back to cardiology to discuss recent visit with Dr Caryl Comes and to discuss f/u echo, etc.

## 2021-12-30 ENCOUNTER — Encounter (HOSPITAL_BASED_OUTPATIENT_CLINIC_OR_DEPARTMENT_OTHER): Payer: Self-pay | Admitting: Cardiology

## 2021-12-30 ENCOUNTER — Ambulatory Visit (INDEPENDENT_AMBULATORY_CARE_PROVIDER_SITE_OTHER): Payer: Self-pay | Admitting: Cardiology

## 2021-12-30 ENCOUNTER — Other Ambulatory Visit: Payer: Self-pay

## 2021-12-30 VITALS — BP 112/70 | HR 82 | Ht 65.0 in | Wt 171.0 lb

## 2021-12-30 DIAGNOSIS — Z712 Person consulting for explanation of examination or test findings: Secondary | ICD-10-CM

## 2021-12-30 DIAGNOSIS — R0602 Shortness of breath: Secondary | ICD-10-CM

## 2021-12-30 DIAGNOSIS — R079 Chest pain, unspecified: Secondary | ICD-10-CM

## 2021-12-30 DIAGNOSIS — G908 Other disorders of autonomic nervous system: Secondary | ICD-10-CM

## 2021-12-30 NOTE — Progress Notes (Signed)
° ° ° ° °  Patient Care Team: Dale Wallace, MD as PCP - General (Internal Medicine) Jodelle Red, MD as PCP - Cardiology (Cardiology) Drema Dallas, DO as Consulting Physician (Neurology)   HPI  Tonya Harris is a 41 y.o. female Seen in followup for symptomatic "jolts" which we have come to link to PVCs for which we recently started flecainide as well as DOE occurring in the setting of nearly disabling anxiety.    Continues with exertional tachypalpiations and dyspnea and concerns about elevated BP.  Her "jolts" are nearly negligible but still with significant symptoms as noted.   DATE TEST EF   4/21 Echo   60-65 %   2/23 CTA  Ca Score 0               Records and Results Reviewed   Past Medical History:  Diagnosis Date   Anxiety    Dyspnea-episodic    Gestational diabetes    Palpitations     Past Surgical History:  Procedure Laterality Date   APPENDECTOMY      Current Meds  Medication Sig   atenolol (TENORMIN) 25 MG tablet Take 1 tablet (25 mg) by mouth once daily   bisoprolol (ZEBETA) 5 MG tablet Take 0.5 tablets (2.5 mg total) by mouth daily. (Patient not taking: Reported on 12/23/2021)   magnesium oxide (MAG-OX) 400 MG tablet TAKE 1 TABLET BY MOUTH EVERY DAY   metoprolol succinate (TOPROL XL) 25 MG 24 hr tablet Take 1 tablet (25 mg total) by mouth daily. (Patient not taking: Reported on 12/23/2021)   Multiple Vitamin (MULTIVITAMIN) tablet Take 1 tablet by mouth daily.   pantoprazole (PROTONIX) 40 MG tablet TAKE 1 TABLET BY MOUTH EVERY DAY   sertraline (ZOLOFT) 100 MG tablet Take 50 mg by mouth daily.   [DISCONTINUED] flecainide (TAMBOCOR) 50 MG tablet Take 0.5 tablets (25 mg total) by mouth 2 (two) times daily.    No Known Allergies    Review of Systems negative except from HPI and PMH  Physical Exam BP 120/76 (BP Location: Left Arm, Patient Position: Sitting, Cuff Size: Normal)    Pulse 90    Ht 5\' 5"  (1.651 m)    Wt 170 lb 3.2 oz (77.2 kg)     SpO2 99%    BMI 28.32 kg/m  Well developed and well nourished in no acute distress HENT normal E scleral and icterus clear Neck Supple JVP flat; carotids brisk and full Clear to ausculation Regular rate and rhythm, no murmurs gallops or rub Soft with active bowel sounds No clubbing cyanosis  Edema Alert and oriented, grossly nrmal motor and sensory function Skin Warm and Dry  ECG sinus 90 15/10/36  CrCl cannot be calculated (Patient's most recent lab result is older than the maximum 21 days allowed.).   Assessment and  Plan PVCs  Tachypalpitations  BP labile  Will increase flecainide 25>>50 mg bid, as she has some improvement.   A little bit hard to assess, as she initially described "jolts" as severely limiting and they are much less but over all her symptoms are only minimally better  With her tachypalps and labile BP, presumably sympathetically mediated,  will try her on BB trial, Rx atenolol 25; bisoprolol 2.5 and metop succinate 25 to see if any are best tolerated and effective    Current medicines are reviewed at length with the patient today .  The patient does not  have concerns regarding medicines.

## 2021-12-30 NOTE — Patient Instructions (Signed)
Medication Instructions:  Your physician recommends that you continue on your current medications as directed. Please refer to the Current Medication list given to you today.   Labwork: NONE  Testing/Procedures: NONE  Follow-Up: AS NEEDED WITH DR CHRISTOPHER  AS SCHEDULED WITH DR Caryl Comes

## 2021-12-30 NOTE — Progress Notes (Incomplete)
Cardiology Office Note:    Date:  12/30/2021   ID:  Tonya Harris, DOB 1981/01/16, MRN [id number]  PCP:  Einar Pheasant, MD  Cardiologist:  Buford Dresser, MD  Referring MD: Einar Pheasant, MD   CC: follow up  History of Present Illness:    Tonya Harris is a 41 y.o. female with a hx of gestational diabetes, Raynaud's who is seen for follow up today. I initially met her 07/20/21 as a new consult at the request of Einar Pheasant, MD for the evaluation and management of chest pain.  See extensive history in consult note 07/20/21. Tests include monitor, CPX, stress echo, cardiac MRI, venous reflux studies, and PFTs. She has also had CT chest and EMG, as well as extensive lab testing. All of her workup to date has been unremarkable. She has seen multiple experts in different specialities at [facility], [facility], [facility], and [facility]. She has seen cardiology, neurology, OB/Gyn, endocrinology, and rheumatology. Despite all of her testing, no clear etiology has been found. She also has seen a psychiatrist and counselor, but these records are not available to me.  She presented to the ED 11/29/21 with complaints of high blood pressure, dizziness, fatigue, and frequent urination. For several days she reported episodes of "feeling funny" associated with headaches, shortness of breath, chest pressure, and blood pressures in the 190s/200s.    Today: Overall, she continues to suffer from episodes of feeling very faint and weak. Especially while walking she beings to feel "funny," including feeling faint, cold, clammy, severe chest pressure. While she has chest pressure, it feels "so tight that she can't breathe." She often tries to take a deep breath but this doesn't feel like a satisfying breath. Walking up the stairs typically causes shortness of breath.  Also she has had several near-syncopal spells, during which she felt tingling sensations and was tremulous. Her blood pressure  was also elevated at these times. After sitting down and drinking water for a while, her symptoms resolved.  Additionally, she endorses some "intense, little headaches" while straining or changing position.  She believes her medication is helping to improve and control the "sudden" jolt-like feelings of her palpitations. Her palpitations may occur suddenly under normal settings such as cooking dinner.  She reports frequent urination. She also believes she is retaining fluid in her bilateral hands. They will often show swelling after walking or when she wakes up in the morning.  Of note, she has not yet tried taking her beta blockers.  She denies any orthopnea or PND.   Past Medical History:  Diagnosis Date   Anxiety    Dyspnea-episodic    Gestational diabetes    Palpitations     Past Surgical History:  Procedure Laterality Date   APPENDECTOMY      Current Medications: Current Outpatient Medications on File Prior to Visit  Medication Sig   atenolol (TENORMIN) 25 MG tablet Take 1 tablet (25 mg) by mouth once daily   bisoprolol (ZEBETA) 5 MG tablet Take 0.5 tablets (2.5 mg total) by mouth daily. (Patient not taking: Reported on 12/23/2021)   flecainide (TAMBOCOR) 50 MG tablet Take 1 tablet (50 mg total) by mouth 2 (two) times daily.   magnesium oxide (MAG-OX) 400 MG tablet TAKE 1 TABLET BY MOUTH EVERY DAY   metoprolol succinate (TOPROL XL) 25 MG 24 hr tablet Take 1 tablet (25 mg total) by mouth daily. (Patient not taking: Reported on 12/23/2021)   Multiple Vitamin (MULTIVITAMIN) tablet Take 1 tablet by  mouth daily.   pantoprazole (PROTONIX) 40 MG tablet TAKE 1 TABLET BY MOUTH EVERY DAY   sertraline (ZOLOFT) 100 MG tablet Take 50 mg by mouth daily.   No current facility-administered medications on file prior to visit.     Allergies:   Patient has no known allergies.   Social History   Tobacco Use   Smoking status: Never   Smokeless tobacco: Never  Vaping Use   Vaping Use:  Never used  Substance Use Topics   Alcohol use: Not Currently   Drug use: Not Currently    Family History: family history includes Hypertension in her father and mother.  ROS:   Please see the history of present illness.   (+) Chest pressure (+) Shortness of breath (+) Generalized weakness, feeling faint (+) Near-syncope (+) Headaches (+) Palpitations (+) Urinary frequency (+) Bilateral hand edema Additional pertinent ROS otherwise unremarkable.  EKGs/Labs/Other Studies Reviewed:    The following studies were reviewed today: Monitor (per note 09/16/20) 21-day Holter monitor at today's visit which revealed sinus rhythm and sinus tachycardia with rare PACs and PVCs.  No evidence of sustained arrhythmias or abnormalities.   Coronary Calcium Score 12/14/2021: FINDINGS: Non-cardiac: See separate report from [facility].   Ascending Aorta: Normal size   Pericardium: Normal   Coronary arteries: Normal origin of left and right coronary arteries. Distribution of arterial calcifications if present, as noted below;   LM 0   LAD 0   LCx 0   RCA 0   Total 0   IMPRESSION AND RECOMMENDATION: 1. Normal coronary calcium score of 0. Patient is low risk for coronary events.   2.  CAC 0, CAC-DRS A0   3.  Continue heart healthy lifestyle and risk factor modification.  LE Venous Reflux 05/23/2021: Right:  - No evidence of deep vein thrombosis seen in the right lower extremity, from the common femoral through the popliteal veins.  - No evidence of superficial venous thrombosis in the right lower  extremity.  - Venous reflux is noted in the right common femoral vein.  - Venous reflux is noted in the right sapheno-femoral junction.  - Venous reflux is noted in the right greater saphenous vein in the thigh.  - Venous reflux is noted in the right greater saphenous vein in the calf.  - Venous reflux is noted in the right popliteal vein.  - Venous reflux is noted in the  right AASV.     Left:  - No evidence of deep vein thrombosis seen in the left lower extremity, from the common femoral through the popliteal veins.  - No evidence of superficial venous thrombosis in the left lower  extremity.  - Venous reflux is noted in the left common femoral vein.  - Venous reflux is noted in the left sapheno-femoral junction.  - Venous reflux is noted in the left greater saphenous vein in the thigh.  - Venous reflux is noted in the left greater saphenous vein in the calf.  - Venous reflux is noted in the left femoral vein.   Cardiopulmonary Exercise Test 11/02/2020: Interpretation   Notes: Patient gave a very good effort. Pulse-oximetry remained 95-99% for the duration of exercise (artifact in earlier exercise). Exercise was performed on on a treadmill beginning at 2.63mh and 0% grade increasing to steady 3.0 mph and 0% grade with 2.5% grade every other minute, with further modifications to challenge patient's fitness level (see attached document for time-down stages)   ECG:  Resting ECG in normal sinus  rhythm. HR response appropriate. There were no sustained arrhythmias or ST-T changes. BP response appropriate.   PFT:  Pre-exercise spirometry was within normal limits. The MVV was normal. Post-exercise spirometry within normal limits.   CPX:  Exercise Capacity- Exercise testing with gas exchange demonstrates a normal peak VO2 of 29.7 ml/kg/min (102% of the age/gender/weight matched sedentary norms). The RER of 1.10 indicates a maximal effort. When adjusted to the patient's ideal body weight of 142.2 lb (64.5 kg) the peak VO2 is 33.6 ml/kg (ibw)/min (110% of the ibw-adjusted predicted).   Cardiovascular response- The O2pulse (a surrogate for stroke volume) increased with incremental exercise reaching peak at 12 ml/beat (100% predicted).   Ventilatory response- The VE/VCO2 slope is normal. The oxygen uptake efficiency slope (OUES) is normal. The VO2 at the ventilatory  threshold was normal at 77% of the predicted peak VO2. At peak exercise, the ventilation reached 63% of the measured MVV and breathing reserve was 44 indicating ventilatory reserve remained.  PETCO2 was normal at 36 mmHg during exercise.   Conclusion: Exercise testing with gas exchange demonstrates normal functional capacity when compared to matched sedentary norms. There is no indication for cardiopulmonary abnormality or exercise-induced bronchospasm.   Cardiac MRI 09/28/20 ([facility]) Cardiac MRI performed with and without contrast on a 3.0 T MRI system to evaluate myocardial morphology,  function, and viability in a 41 year old female patient with a past medical history significant for  Raynaud's, GERD, and anxiety about health who presented for an acute visit.  Patient continues to  experience severe shortness of breath with associated chest pain with minimal activity. No evidence of  sustained arrhythmias or abnormalities.  Stress echocardiogram on 07/02/19 revealed normal LV systolic  function with an EF estimated greater than 55% with no significant valvular abnormalities.     Findings:   1.  The left ventricle is normal in cavity size and wall thickness.  Systolic function is normal.  The  calculated LVEF is 61%.  There are no regional wall motion abnormalities.   2.  The right ventricle is normal in cavity size, wall thickness, and systolic function.     3.  The atria are normal in size.   4. The aortic valve is trileaflet in morphology. There is no significant aortic valve stenosis or  regurgitation. There is no significant valvular disease.   5. Delayed enhancement imaging demonstrates no evidence of myocardial infarction, scar or infiltrative  disease.   6.  No intracardiac thrombus visualized. Normal appearing pericardium.   CONCLUSION: Normal study.   CORE EXAM    MEASUREMENTS   ----------------------------------------------------------------------------------------       VOLUMETRIC ANALYSIS       ----------------------------------------------  .---------------------------------------------------------.                      LV      Reference   RV      Reference    +-----+-----------+-------+-----------+-------+-----------+    EDV   ml          123.8    (94-175)   124.8    (94-178)            ml/m^2       69.2    (62-96)     69.7    (61-98)       ESV   ml           48.1    (27-64)     43.7    (25-77)  ml/m^2       26.9    (17-36)     24.4    (17-43)       CO    L/min        6.36                6.82                        L/min/m^2    3.55                3.81                        g/m^2                                                  SV    ml           75.7    (61-117)    81.1    (59-111)            ml/m^2       42.3    (40-65)     45.3    (38-62)       EF    %            61.1    (57-75)     65.0    (51-75)     '-----+-----------+-------+-----------+-------+-----------'           CARDIAC OUTPUT HR:  84.0 BPM      LV DIMENSIONS       ----------------------------------------------          WALL THICKNESS - ANTEROSEPTAL:  0.7 cm          WALL THICKNESS - INFEROLATERAL:  0.6 cm          LV EDD:  4.9 cm          LV ESD:  3.0 cm       LA DIMENSIONS (LV SYSTOLE)       ----------------------------------------------          DIAMETER:  3.1 cm          AREA - 2 CHAMBER:  19.7 cm^2          LENGTH - 2 CHAMBER:  4.8 cm          AREA - 4 CHAMBER:  18.1 cm^2          LENGTH - 4 CHAMBER:  5.1 cm          VOLUME:  63 ml       AORTIC ROOT DIMENSIONS       ----------------------------------------------          SINUS OF VALSALVA:  2.7 cm          AORTIC ROOT SIZE:  Normal    ANATOMY   ----------------------------------------------------------------------------------------      LEFT VENTRICLE       ----------------------------------------------          WALL THICKNESS:  Normal          CAVITY SIZE:  Normal          MASS/THROMBUS:  None        RIGHT VENTRICLE       ----------------------------------------------  WALL THICKNESS:  Normal          CONTRACTILITY:  Normal          CAVITY SIZE:  Normal          MASS/THROMBUS:  None       INTERVENTRICULAR SEPTUM       ----------------------------------------------   VENTRICULAR SEPTUM:  Normal       LEFT ATRIUM       ----------------------------------------------          CAVITY SIZE:  Normal          MASS/THROMBUS:  None       RIGHT ATRIUM       ----------------------------------------------          CAVITY SIZE:  Normal          MASS/THROMBUS:  None   ADDITIONAL FINDINGS:  None       PERICARDIUM       ----------------------------------------------          Normal          EFFUSION:  None       PLEURAL EFFUSION       ----------------------------------------------   None     VALVES   ----------------------------------------------------------------------------------------      AORTIC VALVE       ----------------------------------------------          AORTIC VALVE LEAFLETS:  Trileaflet          AORTIC REGURGITATION:  None          AORTIC STENOSIS:  None       MITRAL VALVE       ----------------------------------------------          MITRAL VALVE LEAFLETS:  Normal Leaflets          MITRAL REGURGITATION:  None          MITRAL STENOSIS:  None       TRICUSPID VALVE       ----------------------------------------------          TRICUSPID VALVE LEAFLETS:  Normal Leaflets          TRICUSPID REGURGITATION:  None          TRICUSPID STENOSIS:  None       PULMONIC VALVE       ----------------------------------------------          PULMONIC VALVE LEAFLETS:  Normal Leaflets          PULMONIC REGURGITATION:  None          PULMONIC STENOSIS:  None    17 SEGMENT   ----------------------------------------------------------------------------------------  .------------------------------------------------------------------------------------------.    Segments              Wall Motion    Hyperenhancement   Stress Perfusion   Interpretation    +--------------------+--------------+------------------+------------------+----------------+    Base Anterior        Normal/Hyper   None                                  Normal              Base Anteroseptal    Normal/Hyper   None                                  Normal              Base Inferoseptal  Normal/Hyper   None                                  Normal              Base Inferior        Normal/Hyper   None                                  Normal              Base Inferolateral   Normal/Hyper   None                                  Normal              Base Anterolateral   Normal/Hyper   None                                  Normal              Mid Anterior         Normal/Hyper   None                                  Normal              Mid Anteroseptal     Normal/Hyper   None                                  Normal              Mid Inferoseptal     Normal/Hyper   None                                  Normal              Mid Inferior         Normal/Hyper   None                                  Normal              Mid Inferolateral    Normal/Hyper   None                                  Normal              Mid Anterolateral    Normal/Hyper   None                                  Normal              Apical Anterior      Normal/Hyper   None  Normal              Apical Septal        Normal/Hyper   None                                  Normal              Apical Inferior      Normal/Hyper   None                                  Normal              Apical Lateral       Normal/Hyper   None                                  Normal              Apex                 Normal/Hyper   None                                  Normal            +--------------------+--------------+------------------+------------------+----------------+    RV Segments          Wall Motion    Hyperenhancement   Stress  Perfusion   Interpretation    +--------------------+--------------+------------------+------------------+----------------+    RV Basal Anterior    Normal/Hyper   None                                  Normal              RV Basal Inferior    Normal/Hyper   None                                  Normal              RV Mid               Normal/Hyper   None                                  Normal              RV Apical            Normal/Hyper   None                                  Normal            '--------------------+--------------+------------------+------------------+----------------'       FINDINGS       ----------------------------------------------          LV SCAR SIZE (17 SEGMENT):  0 %    SCAN INFO   ==========================================================================================================   GENERAL   ----------------------------------------------------------------------------------------      CONTRAST AGENT       ----------------------------------------------  TYPE:  Dotarem          GD CONCENTRATION:  0.5 M          VOLUME ADMINISTERED:  22 ml          DOSAGE:  0.15 mmol/kg       SEDATION       ----------------------------------------------          SEDATION USED?:  No       VITALS       ----------------------------------------------          HEIGHT:  65.00 in          SYSTOLIC BP:  947 mmHg          HEIGHT:  165.1 cm          DIASTOLIC BP:  75 mmHg          WEIGHT:  158.07 lbs          BASELINE HR:  81 BPM          WEIGHT:  71.70 kgs          HEART RHYTHM:  Normal Sinus Rhythm          BSA:  1.79 m^2       PULSE SEQUENCES       ----------------------------------------------          SSFP cine, 2D LGE segmented, 2D LGE single-shot, Black-blood LGE (or Grey-blood), Pre-contrast T1          mapping, Post-contrast T1 mapping, First-pass perfusion without stress       SETUP       ----------------------------------------------          SCAN  TYPE:  Both          SCANNER STRENGTH:  3T          PATIENT TYPE:  Outpatient          INCOMPLETE SCAN:  No          REASON(S) FOR SCAN:  Dyspnea          REFERRING PHYSICIAN:  NICOLE STEPHENS          ATTENDING PHYSICIAN:  Nevin Bloodgood, M.D.          TECHNOLOGIST:  Kandy Garrison   Cardiac monitoring ([facility]) 11/23/19  Echo stress test 07/02/2019 ([facility]) STRESS ECHOCARDIOGRAPHY                  Drugs: None              Target HR: 156 bpm           Maximum Predicted HR: 184 bpm  +----------------------------------------------------------------------------+  :Stage           Duration                     HR         BP                  :  :   RESTING     :                            :73        :108/68              :  :---------------+----------------------------+----------+---------+          :  :  EXERCISE     :12:00                       :  173       :/                   :  :---------------:----------------------------:----------:---------+          :  :  RECOVERY     :3:28                        :99        :114/65              :  :---------------+----------------------------+----------+---------+          :  +----------------------------------------------------------------------------+        Stress Duration: 12: 00 mm: ss        Max Stress H.R.: 173 bpm             Target HR Achieved: Yes  _________________________________________________________________________________________  WALL SEGMENT CHANGES                         Rest                Stress  Anterior Septum Basal: Normal              Hyperkinetic                    Mid: Normal              Hyperkinetic                 Apical: Normal              Hyperkinetic    Anterior Wall Basal: Normal              Hyperkinetic                    Mid: Normal              Hyperkinetic                 Apical: Normal              Hyperkinetic     Lateral Wall Basal: Normal              Hyperkinetic                    Mid: Normal               Hyperkinetic                 Apical: Normal              Hyperkinetic   Posterior Wall Basal: Normal              Hyperkinetic                    Mid: Normal              Hyperkinetic    Inferior Wall Basal: Normal              Hyperkinetic                    Mid: Normal              Hyperkinetic                 Apical: Normal  Hyperkinetic  Inferior Septum Basal: Normal              Hyperkinetic                    Mid: Normal              Hyperkinetic             Resting EF: >55% (Est.)                  Stress EF: >55% (Est.)   _________________________________________________________________________________________  ADDITIONAL FINDINGS  _________________________________________________________________________________________  STRESS ECG RESULTS            ECG Results: Normal  _________________________________________________________________________________________  ECHOCARDIOGRAPHIC DESCRIPTIONS  LEFT VENTRICLE                   Size: Normal            Contraction: Normal              LV Masses: No Masses                    LVH: None  RIGHT VENTRICLE                   Size: Normal                       Free Wall: Normal            Contraction: Normal                       RV Masses: No mass  PERICARDIUM                  Fluid: No effusion  _________________________________________________________________________________________   DOPPLER ECHO and OTHER SPECIAL PROCEDURES                 Aortic: No AR                      No AS                 Mitral: TRIVIAL MR                 No MS                         MV Inflow E Vel = nm*      MV Annulus E'Vel = nm*                         E/E'Ratio = nm*              Tricuspid: TRIVIAL TR                 No TS                         173.7 cm/sec peak TR vel   15.1 mmHg peak RV pressure              Pulmonary: TRIVIAL PR                 No PS   _________________________________________________________________________________________  ECHOCARDIOGRAPHIC MEASUREMENTS  2D DIMENSIONS  AORTA                  Values   Normal Range  MAIN PA         Values    Normal Range                Annulus: nm*     [2.1-2.5]              PA Main: nm*       [1.5-2.1]              Aorta Sin: nm*     [2.7-3.3]    RIGHT VENTRICLE            ST Junction: nm*     [2.3-2.9]              RV Base: nm*       [<4.2]              Asc.Aorta: nm*     [2.3-3.1]               RV Mid: nm*       [<3.5]  LEFT VENTRICLE                                      RV Length: nm*       [<8.6]                  LVIDd: nm*     [3.9-5.3]    INFERIOR VENA CAVA                  LVIDs: nm*                           Max. IVC: nm*       [<=2.1]                     FS: nm*     [>25]                 Min. IVC: nm*                    SWT: nm*     [0.5-0.9]    ------------------                    PWT: nm*     [0.5-0.9]    nm* - not measured  LEFT ATRIUM                LA Diam: nm*     [2.7-3.8]            LA A4C Area: nm*     [<20]              LA Volume: nm*     [22-52]  _________________________________________________________________________________________  INTERPRETATION  Normal Stress Echocardiogram  NORMAL RIGHT VENTRICULAR SYSTOLIC FUNCTION  TRIVIAL REGURGITATION NOTED (See above)  NO VALVULAR STENOSIS NOTED  Resting EF: >55% (Est.)  Post Stress EF: >55% (Est.)  ECG Results: Normal  Mitral: TRIVIAL MR  Tricuspid: TRIVIAL TR   EKG:  EKG is personally reviewed.   12/30/2021: *** 07/20/2021: NSR at 82 bpm  Recent Labs: 11/21/2021: ALT 25; Magnesium 1.9; TSH 1.00 11/29/2021: BUN 5; Creatinine, Ser 0.74; Hemoglobin 13.6; Platelets 312; Potassium 4.0; Sodium 138   Recent Lipid Panel No results found for: CHOL, TRIG, HDL, CHOLHDL, VLDL, LDLCALC, LDLDIRECT  Physical Exam:    VS:  There were no  vitals taken for this visit.    Wt Readings from Last 3 Encounters:  12/22/21 170  lb 3.2 oz (77.2 kg)  12/09/21 167 lb (75.8 kg)  12/01/21 167 lb 6.4 oz (75.9 kg)    GEN: Well nourished, well developed in no acute distress HEENT: Normal, moist mucous membranes NECK: No JVD CARDIAC: regular rhythm, normal S1 and S2, no rubs or gallops. No murmur. VASCULAR: Radial and DP pulses 2+ bilaterally. No carotid bruits RESPIRATORY:  Clear to auscultation without rales, wheezing or rhonchi  ABDOMEN: Soft, non-tender, non-distended MUSCULOSKELETAL:  Ambulates independently SKIN: Warm and dry, no edema NEUROLOGIC:  Alert and oriented x 3. No focal neuro deficits noted. PSYCHIATRIC:  Normal affect    ASSESSMENT:    No diagnosis found.  PLAN:    Chest pain Shortness of breath Dysautonomia-like syndrome Please see the extensive summary from previously.  For some reason, the orthostatics were not saved on her prior visit, but to date they have been unremarkable.   We discussed conservative management again today, including hydration, compression, and gradual exercise conditioning.  She endorses that this is a complete change for her. She used to be very active and now is extremely limited by her symptoms.  She is asking to see an EP to make sure there are no other issues with the electrical system of her heart. We reviewed the prior testing she has undergone. I will ask Dr. Caryl Comes to see her for a second opinion.  Offered my support.  Plan for follow up: As scheduled with Dr. Caryl Comes.  PRN with me.  Buford Dresser, MD, PhD, [facility]    Medication Adjustments/Labs and Tests Ordered: Current medicines are reviewed at length with the patient today.  Concerns regarding medicines are outlined above.   No orders of the defined types were placed in this encounter.  No orders of the defined types were placed in this encounter.  There are no Patient Instructions on file for this visit.   I,Mathew Stumpf,acting as a Education administrator for [employer], MD.,have documented all relevant documentation on the behalf of Buford Dresser, MD,as directed by  Buford Dresser, MD while in the presence of Buford Dresser, MD.  ***  Signed, Buford Dresser, MD PhD 12/30/2021 1:08 PM    [place]

## 2022-01-02 ENCOUNTER — Telehealth: Payer: Self-pay

## 2022-01-02 NOTE — Telephone Encounter (Signed)
Attempted to complete pre-planning call. °LM for pt to cb °

## 2022-01-03 ENCOUNTER — Ambulatory Visit (INDEPENDENT_AMBULATORY_CARE_PROVIDER_SITE_OTHER): Payer: Self-pay | Admitting: Internal Medicine

## 2022-01-03 ENCOUNTER — Encounter: Payer: Self-pay | Admitting: Internal Medicine

## 2022-01-03 ENCOUNTER — Other Ambulatory Visit: Payer: Self-pay

## 2022-01-03 DIAGNOSIS — F419 Anxiety disorder, unspecified: Secondary | ICD-10-CM

## 2022-01-03 DIAGNOSIS — R519 Headache, unspecified: Secondary | ICD-10-CM

## 2022-01-03 DIAGNOSIS — R030 Elevated blood-pressure reading, without diagnosis of hypertension: Secondary | ICD-10-CM

## 2022-01-03 DIAGNOSIS — M2560 Stiffness of unspecified joint, not elsewhere classified: Secondary | ICD-10-CM

## 2022-01-03 DIAGNOSIS — R0602 Shortness of breath: Secondary | ICD-10-CM

## 2022-01-03 DIAGNOSIS — R0789 Other chest pain: Secondary | ICD-10-CM

## 2022-01-03 DIAGNOSIS — R253 Fasciculation: Secondary | ICD-10-CM

## 2022-01-03 NOTE — Progress Notes (Signed)
Patient ID: Tonya Harris, female   DOB: 18-Dec-1980, 41 y.o.   MRN: [id number]   Subjective:    Patient ID: Tonya Harris, female    DOB: 1980/12/08, 41 y.o.   MRN: [id number]  This visit occurred during the SARS-CoV-2 public health emergency.  Safety protocols were in place, including screening questions prior to the visit, additional usage of staff PPE, and extensive cleaning of exam room while observing appropriate contact time as indicated for disinfecting solutions.   Patient here for work in appt .   HPI Work in with concerns regarding persistent sob, increased urination and muscle twitching.  She has recently seen Dr Harrell Gave and Dr Caryl Comes.  Dr Caryl Comes placed her on flecainide.  Also gave her beta blockers to [place] prn.  Discussed recent visits.  Discussed using beta blockers.  She has not tried.  She is taking flecainide.  Trying to be more active overall.  Does describe these needle sensations - different parts of her body.  Still get the "jolts" - but maybe not as bad.  She request a f/u with neurology to reevaluate - nerves, muscle twitching, etc.  Discussed referral.  She request to contact cardiology and see if they have a neurologist she prefers.  Increased urination.  No dysuria.  No hematuria reported.     Past Medical History:  Diagnosis Date   Anxiety    Dyspnea-episodic    Gestational diabetes    Palpitations    Past Surgical History:  Procedure Laterality Date   APPENDECTOMY     Family History  Problem Relation Age of Onset   Hypertension Mother    Hypertension Father    Social History   Socioeconomic History   Marital status: Married    Spouse name: Not on file   Number of children: 4   Years of education: Not on file   Highest education level: Master's degree (e.g., MA, MS, MEng, MEd, MSW, MBA)  Occupational History   Occupation: Pharmacist, hospital   Occupation: Stay at home mom  Tobacco Use   Smoking status: Never   Smokeless tobacco: Never  Vaping Use    Vaping Use: Never used  Substance and Sexual Activity   Alcohol use: Not Currently   Drug use: Not Currently   Sexual activity: Yes    Partners: Male  Other Topics Concern   Not on file  Social History Narrative   Right handed      Master's degree in teaching       Stay at home mom of 4 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  HENT:  Negative for congestion and sinus pressure.   Respiratory:  Negative for cough.        Does report the sob. Feeling like she is not getting enough air in - at times.   Cardiovascular:  Negative for leg swelling.       Reports hands swelling.  Chest discomfort as outlined previously.    Gastrointestinal:  Negative for abdominal pain, diarrhea, nausea and vomiting.  Genitourinary:  Positive for frequency. Negative for dysuria.  Musculoskeletal:        Repots muscle twitching.  More stiff.  Skin:  Negative for color change and rash.  Neurological:  Positive for headaches. Negative for dizziness.       Occasional headache.   Psychiatric/Behavioral:  Negative for dysphoric mood.        Increased stress related to these persistent issues.       Objective:     BP 122/72 (BP Location: Left Arm, Patient Position: Sitting, Cuff Size: Normal)    Pulse 76    Temp 98 F (36.7 C) (Oral)    Resp 16    Ht 5\' 5"  (1.651 m)    Wt 169 lb 8 oz (76.9 kg)    SpO2 97%    BMI 28.21 kg/m  Wt Readings from Last 3 Encounters:  01/03/22 169 lb 8 oz (76.9 kg)  12/30/21 171 lb (77.6 kg)  12/22/21 170 lb 3.2 oz (77.2 kg)    Physical Exam Vitals reviewed.  Constitutional:      General: She is not in acute distress.    Appearance: Normal appearance.  HENT:      Head: Normocephalic and atraumatic.     Right Ear: External ear normal.     Left Ear: External ear normal.  Eyes:     General: No scleral icterus.       Right eye: No discharge.        Left eye: No discharge.     Conjunctiva/sclera: Conjunctivae normal.  Neck:     Thyroid: No thyromegaly.  Cardiovascular:     Rate and Rhythm: Normal rate and regular rhythm.  Pulmonary:     Effort: No respiratory distress.     Breath sounds: Normal breath sounds. No wheezing.  Abdominal:     General: Bowel sounds are normal.     Palpations: Abdomen is soft.     Tenderness: There is no abdominal tenderness.  Musculoskeletal:        General: No swelling or tenderness.     Cervical back: Neck supple. No tenderness.  Lymphadenopathy:     Cervical: No cervical adenopathy.  Skin:    Findings: No erythema or rash.  Neurological:     Mental Status: She is alert.  Psychiatric:        Mood and Affect: Mood normal.        Behavior: Behavior normal.     Outpatient Encounter Medications as of 01/03/2022  Medication Sig   flecainide (TAMBOCOR) 50 MG tablet Take 1 tablet (50 mg total) by mouth 2 (two) times daily.   magnesium oxide (MAG-OX) 400 MG tablet TAKE 1 TABLET BY MOUTH EVERY DAY   Multiple Vitamin (MULTIVITAMIN) tablet Take 1 tablet by mouth daily.   pantoprazole (PROTONIX) 40 MG tablet TAKE 1 TABLET BY MOUTH EVERY DAY   sertraline (ZOLOFT) 100 MG tablet Take 50 mg by mouth daily.   atenolol (TENORMIN) 25 MG tablet Take 1 tablet (25 mg) by mouth once daily (Patient not taking: Reported on 12/30/2021)   bisoprolol (ZEBETA) 5 MG tablet Take 0.5 tablets (2.5 mg total) by mouth daily. (Patient not taking: Reported on 12/23/2021)   metoprolol succinate (TOPROL XL) 25 MG 24 hr tablet Take 1 tablet (25 mg total) by mouth daily. (Patient not taking: Reported on 12/23/2021)   No facility-administered encounter medications on file as of 01/03/2022.     Lab Results  Component Value Date   WBC 8.5 11/29/2021    HGB 13.6 11/29/2021   HCT 42.2 11/29/2021   PLT 312 11/29/2021   GLUCOSE 103 (H) 11/29/2021   ALT  25 11/21/2021   AST 21 11/21/2021   NA 138 11/29/2021   K 4.0 11/29/2021   CL 107 11/29/2021   CREATININE 0.74 11/29/2021   BUN 5 (L) 11/29/2021   CO2 25 11/29/2021   TSH 1.00 11/21/2021    CT CARDIAC SCORING (SELF PAY ONLY)  Addendum Date: 12/15/2021   ADDENDUM REPORT: 12/15/2021 08:08 EXAM: OVER-READ INTERPRETATION  CT CHEST The following report is an over-read performed by radiologist Dr. Darlys Gales [facility] on 12/15/2021. This over-read does not include interpretation of cardiac or coronary anatomy or pathology. The coronary calcium score interpretation by the cardiologist is attached. COMPARISON:  Chest CT 09/17/2020. FINDINGS: Vascular: No significant vascular findings. Mediastinum/Nodes: No lymphadenopathy within the field of view. Lungs/Pleura: No focal airspace disease, suspicious pulmonary nodule, pleural effusion, or pneumothorax within the field of view. There is an intrapulmonary lymph node along the right major fissure. Upper Abdomen: No acute abnormality. Musculoskeletal: No chest wall mass or suspicious bone lesions identified within the field of view. IMPRESSION: No acute extracardiac findings in the chest. Electronically Signed   By: Caprice Renshaw M.D.   On: 12/15/2021 08:08   Result Date: 12/15/2021 CLINICAL DATA:  Risk stratification EXAM: Coronary Calcium Score TECHNIQUE: The patient was scanned on a Siemens Somatom go.Top Scanner. Axial non-contrast 3 mm slices were carried out through the heart. The data set was analyzed on a dedicated work station and scored using the Agatson method. FINDINGS: Non-cardiac: See separate report from [facility]. Ascending Aorta: Normal size Pericardium: Normal Coronary arteries: Normal origin of left and right coronary arteries. Distribution of arterial calcifications if present, as noted below; LM 0 LAD 0 LCx 0 RCA 0  Total 0 IMPRESSION AND RECOMMENDATION: 1. Normal coronary calcium score of 0. Patient is low risk for coronary events. 2.  CAC 0, CAC-DRS A0 3.  Continue heart healthy lifestyle and risk factor modification. Debbe Odea Electronically Signed: By: Debbe Odea M.D. On: 12/14/2021 16:25       Assessment & Plan:   Problem List Items Addressed This Visit     Anxiety    Continues on zoloft.  Followed by Dr Maryruth Bun.  Have discussed how anxiety can affect her current symptoms.  She has a prescription for lorazepam.  Have discussed using prn. Also has beta blocker to take prn.   Discussed using.  Follow.       Chest discomfort    Has had extensive cardiac w/up previously  -  Including a CPX test with a normal heart rate response, stress echo, cardiac MRI, pulmonary function testing and monitors. 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  DR Graciela Husbands - zio monitor.  Recently started on flecainide.  Has helped the "jolt" sensations.  Still with the feeling of not being able to get a good breath.  Just saw Dr Cristal Deer in follow up.  No changes made.  Discussed using low dose beta blockers prn.  Follow.           Elevated blood pressure reading    Concerned regarding elevated blood pressures - intermittent.  Blood pressure ok today. Cardiac w/up as outlined.   Discussed symptoms create increased anxiety. On zoloft.  Discussed using lorazepam if needed.   Has had extensive cardiac w/up as outlined.  Follow.       Headache    Intermittent headache.  MRI 05/2021 - negative.  Request referral to neurology as outlined.       Joint stiffness  Persistent joint stiffness.  Saw rheumatology.  Discussed stretching and exercise.  Follow.       Muscle twitching    Has been diagnosed with benign fasciculation syndrome.  Has seen a local neurologist.  She is concerned regarding persistent symptoms.  Would like reevaluation by neurology.  She prefers to discuss with cardiology and see if  she has a neurologist she recommends.        SOB (shortness of breath)    Has a persistent feeling of not being able to get a good breath.  Has seen pulmonary.  Diagnosed with mild asthma.  Intolerance to trelegy.  Control anxiety.  Recent calcium score - 0.  Seeing Dr Caryl Comes.  On flecainide.  Has beta blocker rx if needed.  Discussed testing and reassuring results.  Just saw Dr Harrell Gave. No further cardiac w/up warranted.         Einar Pheasant, MD

## 2022-01-04 ENCOUNTER — Encounter (HOSPITAL_BASED_OUTPATIENT_CLINIC_OR_DEPARTMENT_OTHER): Payer: Self-pay

## 2022-01-04 ENCOUNTER — Encounter: Payer: Self-pay | Admitting: Internal Medicine

## 2022-01-04 DIAGNOSIS — R3589 Other polyuria: Secondary | ICD-10-CM

## 2022-01-05 ENCOUNTER — Other Ambulatory Visit: Payer: Self-pay

## 2022-01-05 NOTE — Telephone Encounter (Signed)
Patient seen on 01/03/22 ok to [facility] and schedule next week. ?

## 2022-01-05 NOTE — Telephone Encounter (Signed)
Left message requesting to have patient call back to let us know which beta blocker she is taking. (Given 3 to try by Dr. Caryl Comes) ?

## 2022-01-05 NOTE — Telephone Encounter (Signed)
This is a  pt 

## 2022-01-05 NOTE — Telephone Encounter (Signed)
Please advise on how you would like to proceed?

## 2022-01-05 NOTE — Telephone Encounter (Signed)
Patient called back.  ?Patient stated that she is currently taking Metoprolol, Which she picked up yesterday.  ? ?She stated that she was going to try this one and if she does not see any relief then she will try another one.  ?

## 2022-01-05 NOTE — Telephone Encounter (Signed)
She already has an appt scheduled.  Ok to order urinalysis.  ?

## 2022-01-06 ENCOUNTER — Ambulatory Visit: Payer: Self-pay | Admitting: Internal Medicine

## 2022-01-09 ENCOUNTER — Encounter: Payer: Self-pay | Admitting: Internal Medicine

## 2022-01-09 NOTE — Assessment & Plan Note (Signed)
Intermittent headache.  MRI 05/2021 - negative.  Request referral to neurology as outlined.  ?

## 2022-01-09 NOTE — Assessment & Plan Note (Signed)
Has a persistent feeling of not being able to get a good breath.  Has seen pulmonary.  Diagnosed with mild asthma.  Intolerance to trelegy.  Control anxiety.  Recent calcium score - 0.  Seeing Dr Graciela Husbands.  On flecainide.  Has beta blocker rx if needed.  Discussed testing and reassuring results.  Just saw Dr Cristal Deer. No further cardiac w/up warranted.  ?

## 2022-01-09 NOTE — Assessment & Plan Note (Signed)
Has been diagnosed with benign fasciculation syndrome.  Has seen a local neurologist.  She is concerned regarding persistent symptoms.  Would like reevaluation by neurology.  She prefers to discuss with cardiology and see if she has a neurologist she recommends.   ?

## 2022-01-09 NOTE — Assessment & Plan Note (Signed)
Continues on zoloft.  Followed by Dr Maryruth Bun.  Have discussed how anxiety can affect her current symptoms.  She has a prescription for lorazepam.  Have discussed using prn. Also has beta blocker to take prn.   Discussed using.  Follow.  ?

## 2022-01-09 NOTE — Assessment & Plan Note (Signed)
Persistent joint stiffness.  Saw rheumatology.  Discussed stretching and exercise.  Follow.  ?

## 2022-01-09 NOTE — Assessment & Plan Note (Signed)
Concerned regarding elevated blood pressures - intermittent.  Blood pressure ok today. Cardiac w/up as outlined.   Discussed symptoms create increased anxiety. On zoloft.  Discussed using lorazepam if needed.   Has had extensive cardiac w/up as outlined.  Follow.  ?

## 2022-01-09 NOTE — Assessment & Plan Note (Signed)
Has had extensive cardiac w/up previously  -  Including a CPX test with a normal heart rate response, stress echo, cardiac MRI, pulmonary function testing and monitors. 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  DR Graciela Husbands - zio monitor.  Recently started on flecainide.  Has helped the "jolt" sensations.  Still with the feeling of not being able to get a good breath.  Just saw Dr Cristal Deer in follow up.  No changes made.  Discussed using low dose beta blockers prn.  Follow.      ?

## 2022-01-11 ENCOUNTER — Ambulatory Visit: Payer: Self-pay | Admitting: Internal Medicine

## 2022-01-11 ENCOUNTER — Telehealth: Payer: Self-pay | Admitting: Cardiology

## 2022-01-11 NOTE — Telephone Encounter (Signed)
Pt c/o of Chest Pain: STAT if CP now or developed within 24 hours ? ?1. Are you having CP right now? No  ? ?2. Are you experiencing any other symptoms (ex. SOB, nausea, vomiting, sweating)? SOB ? ?3. How long have you been experiencing CP? Has had it for awhile on going thing that Dr. Cristal Deer knows about  ? ?4. Is your CP continuous or coming and going? Coming and going  ? ?5. Have you taken Nitroglycerin? No  ? ?States the new medication Dr. Cristal Deer put her on is helping with SOB, but still having chest tightness. Appointment has been scheduled for 01/19/22 with Gillian Shields.  ??  ?

## 2022-01-11 NOTE — Telephone Encounter (Signed)
No current chest pain, scheduled with CDan Humphreys on 3/16 ?

## 2022-01-16 ENCOUNTER — Other Ambulatory Visit: Payer: Self-pay | Admitting: *Deleted

## 2022-01-17 ENCOUNTER — Ambulatory Visit: Payer: Self-pay | Admitting: Internal Medicine

## 2022-01-17 NOTE — Progress Notes (Deleted)
? ? ?Office Visit  ?  ?Patient Name: Tonya Harris ?Date of Encounter: 01/17/2022 ? ?Primary Care Provider:  [facility], MD ?Primary Cardiologist:  Jodelle Red, MD ?Primary Electrophysiologist: None ?Chief Complaint  ?  ?Tonya Harris is a 41 y.o. female presents today for 30-month follow-up ? ?History of Present Illness  ?  ?Tonya Harris is a 41 y.o. female with PMH of gestational diabetes, GERD, Raynaud's, anxiety, palpitations, and chest pain.  She has underwent extensive cardiac, neurological testing since August 2020 with all resulting in normal findings and no clear etiology for symptoms.  She was referred to Dr. Graciela Husbands 12/22 and was started on flecainide 2/23.  Patient continued to have complaints of labile blood pressure and was started subsequently on bisoprolol 5 mg Toprol 25 mg and atenolol 25 mg. ? ?Since{Blank single:19197::"last being seen in our clinic","discharge from hospital"} the patient reports doing ***.  @He /She@ denies chest pain, palpitations, dyspnea, PND, orthopnea, nausea, vomiting, dizziness, syncope, edema, weight gain, or early satiety. ? ? ?Notes: ?Medication compliance and potential side effects ? ? ?Past Medical History  ?  ?Past Medical History:  ?Diagnosis Date  ? Anxiety   ? Dyspnea-episodic   ? Gestational diabetes   ? Palpitations   ? ?Past Surgical History:  ?Procedure Laterality Date  ? APPENDECTOMY    ? ? ?Allergies ? ?No Known Allergies ? ?Home Medications  ?  ?Current Outpatient Medications  ?Medication Sig Dispense Refill  ? atenolol (TENORMIN) 25 MG tablet Take 1 tablet (25 mg) by mouth once daily (Patient not taking: Reported on 12/30/2021) 30 tablet 0  ? bisoprolol (ZEBETA) 5 MG tablet Take 0.5 tablets (2.5 mg total) by mouth daily. (Patient not taking: Reported on 12/23/2021) 15 tablet 0  ? flecainide (TAMBOCOR) 50 MG tablet Take 1 tablet (50 mg total) by mouth 2 (two) times daily. 60 tablet 6  ? magnesium oxide (MAG-OX) 400 MG tablet TAKE 1  TABLET BY MOUTH EVERY DAY 90 tablet 1  ? metoprolol succinate (TOPROL XL) 25 MG 24 hr tablet Take 1 tablet (25 mg total) by mouth daily. (Patient not taking: Reported on 12/23/2021) 30 tablet 0  ? Multiple Vitamin (MULTIVITAMIN) tablet Take 1 tablet by mouth daily.    ? pantoprazole (PROTONIX) 40 MG tablet TAKE 1 TABLET BY MOUTH EVERY DAY 90 tablet 1  ? sertraline (ZOLOFT) 100 MG tablet Take 50 mg by mouth daily.    ? ?No current facility-administered medications for this visit.  ?  ? ?Review of Systems  ?Please see the history of present illness.    ?(+)*** ?(+)*** ? ?All other systems reviewed and are otherwise negative except as noted above. ? ?Physical Exam  ?  ?Wt Readings from Last 3 Encounters:  ?01/03/22 169 lb 8 oz (76.9 kg)  ?12/30/21 171 lb (77.6 kg)  ?12/22/21 170 lb 3.2 oz (77.2 kg)  ? ?12/24/21 were no vitals filed for this visit.,There is no height or weight on file to calculate BMI. ? ?GEN: Well nourished, well developed, in no acute distress. ?Neck: Supple, no JVD, carotid bruits, or masses. ?Cardiac: S1,S2,***RRR, no murmurs, rubs, or gallops. No clubbing, cyanosis, edema.  ?Radials/PT 2+ and equal bilaterally.  ?Respiratory:  ***Respirations regular and unlabored, clear to auscultation bilaterally. ?MS: no deformity or atrophy. ?Skin: warm and dry, no rash. ?Neuro:  Strength and sensation are intact. ?Psych: Normal affect. ? ?EKG/LABS/Other Studies Reviewed  ?  ?ECG personally reviewed by me today - ***  with rate of ***-  no acute changes. ? ?Risk Assessment/Calculations:   ?{Does this patient have ATRIAL FIBRILLATION?:[phone]} ? ?    ? ? ?Lab Results  ?Component Value Date  ? WBC 8.5 11/29/2021  ? HGB 13.6 11/29/2021  ? HCT 42.2 11/29/2021  ? MCV 88.8 11/29/2021  ? PLT 312 11/29/2021  ? ?Lab Results  ?Component Value Date  ? CREATININE 0.74 11/29/2021  ? BUN 5 (L) 11/29/2021  ? NA 138 11/29/2021  ? K 4.0 11/29/2021  ? CL 107 11/29/2021  ? CO2 25 11/29/2021  ? ?Lab Results  ?Component Value Date   ? ALT 25 11/21/2021  ? AST 21 11/21/2021  ? ALKPHOS 41 11/21/2021  ? BILITOT 0.3 11/21/2021  ? ?No results found for: CHOL, HDL, LDLCALC, LDLDIRECT, TRIG, CHOLHDL  ?No results found for: HGBA1C ? ?Assessment & Plan  ?  ?1.  Tachypalpitations/PVCs: ? ? ?2.  Shortness of breath: ? ? ? ?Disposition: Follow-up with Jodelle Red, MD or APP in *** months ?{Are you ordering a CV Procedure (e.g. stress test, cath, DCCV, TEE, etc)?   Press F2        :[id number]}  ? ?Medication Adjustments/Labs and Tests Ordered: ?Current medicines are reviewed at length with the patient today.  Concerns regarding medicines are outlined above.  ?Tests Ordered: ?No orders of the defined types were placed in this encounter. ? ?Medication Changes: ?No orders of the defined types were placed in this encounter. ? ? ?Signed, ?Napoleon Form, Leodis Rains, NP ?01/17/2022, 5:02 PM ?[facility] ?

## 2022-01-19 ENCOUNTER — Ambulatory Visit (HOSPITAL_BASED_OUTPATIENT_CLINIC_OR_DEPARTMENT_OTHER): Payer: Self-pay | Admitting: Family

## 2022-01-20 ENCOUNTER — Ambulatory Visit: Payer: Self-pay | Admitting: Internal Medicine

## 2022-01-26 ENCOUNTER — Other Ambulatory Visit: Payer: Self-pay

## 2022-01-26 NOTE — Telephone Encounter (Signed)
Left message requesting to have patient call back RE: which beta blocker she is going to take prior to refilling. ?

## 2022-01-26 NOTE — Telephone Encounter (Signed)
This is a Educational psychologist pt, that Dr. Graciela Husbands sees. Please address ?

## 2022-01-30 ENCOUNTER — Other Ambulatory Visit: Payer: Self-pay

## 2022-01-30 MED ORDER — METOPROLOL SUCCINATE ER 25 MG PO TB24: 25.0000 mg | ORAL_TABLET | Freq: Every day | ORAL | 0 refills | Status: DC

## 2022-01-31 ENCOUNTER — Ambulatory Visit: Payer: Self-pay | Admitting: Internal Medicine

## 2022-02-03 ENCOUNTER — Ambulatory Visit: Payer: Self-pay | Admitting: Internal Medicine

## 2022-02-07 ENCOUNTER — Encounter (HOSPITAL_BASED_OUTPATIENT_CLINIC_OR_DEPARTMENT_OTHER): Payer: Self-pay | Admitting: Cardiology

## 2022-02-09 ENCOUNTER — Ambulatory Visit (HOSPITAL_BASED_OUTPATIENT_CLINIC_OR_DEPARTMENT_OTHER): Payer: Self-pay | Admitting: Family

## 2022-02-21 ENCOUNTER — Ambulatory Visit (INDEPENDENT_AMBULATORY_CARE_PROVIDER_SITE_OTHER): Payer: Self-pay | Admitting: Internal Medicine

## 2022-02-21 ENCOUNTER — Encounter: Payer: Self-pay | Admitting: Internal Medicine

## 2022-02-21 DIAGNOSIS — M2560 Stiffness of unspecified joint, not elsewhere classified: Secondary | ICD-10-CM

## 2022-02-21 DIAGNOSIS — R531 Weakness: Secondary | ICD-10-CM

## 2022-02-21 DIAGNOSIS — R0602 Shortness of breath: Secondary | ICD-10-CM

## 2022-02-21 DIAGNOSIS — J4530 Mild persistent asthma, uncomplicated: Secondary | ICD-10-CM

## 2022-02-21 DIAGNOSIS — F419 Anxiety disorder, unspecified: Secondary | ICD-10-CM

## 2022-02-21 DIAGNOSIS — R253 Fasciculation: Secondary | ICD-10-CM

## 2022-02-21 NOTE — Progress Notes (Signed)
Patient ID: Tonya Harris, female   DOB: 14-Aug-1981, 41 y.o.   MRN: [id number] ? ? ?Subjective:  ? ? Patient ID: Tonya Harris, female    DOB: [birth year], 41 y.o.   MRN: [id number] ? ?This visit occurred during the SARS-CoV-2 public health emergency.  Safety protocols were in place, including screening questions prior to the visit, additional usage of staff PPE, and extensive cleaning of exam room while observing appropriate contact time as indicated for disinfecting solutions.  ? ?Patient here for a scheduled follow up.  ? ?Chief Complaint  ?Patient presents with  ? Follow-up  ?  F/u for S.O.B and weakness  ? .  ? ?HPI ?Follow up regarding increased sob, muscle twitching and chest sensations.  She has recently seen by Dr Cristal Deer and Dr Graciela Husbands.  Started on metoprolol.  Feels is helping with the sensations in her chest.  Does feel some better.  Still with weak spells and dizziness.  Is trying to get out and walk.  Will jog and walk in am.  Breathing appears to be stable.  Eating.  No vomiting.  Bowels stable.  Still with muscle twitching.  Also, describes her head not feeling right at times.  Has seen neurology.  Desires reevaluation.  Will let me know who she prefers to see.  ? ? ?Past Medical History:  ?Diagnosis Date  ? Anxiety   ? Dyspnea-episodic   ? Gestational diabetes   ? Palpitations   ? ?Past Surgical History:  ?Procedure Laterality Date  ? APPENDECTOMY    ? ?Family History  ?Problem Relation Age of Onset  ? Hypertension Mother   ? Hypertension Father   ? ?Social History  ? ?Socioeconomic History  ? Marital status: Married  ?  Spouse name: Not on file  ? Number of children: 4  ? Years of education: Not on file  ? Highest education level: Master's degree (e.g., MA, MS, MEng, MEd, MSW, MBA)  ?Occupational History  ? Occupation: Runner, broadcasting/film/video  ? Occupation: Stay at home mom  ?Tobacco Use  ? Smoking status: Never  ? Smokeless tobacco: Never  ?Vaping Use  ? Vaping Use: Never used  ?Substance and Sexual Activity   ? Alcohol use: Not Currently  ? Drug use: Not Currently  ? Sexual activity: Yes  ?  Partners: Male  ?Other Topics Concern  ? Not on file  ?Social History Narrative  ? Right handed  ?   ? Master's degree in teaching   ?   ? 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 ?Social Determinants of Health  ? ?Financial Resource Strain: Not on file  ?Food Insecurity: Not on file  ?Transportation Needs: Not on file  ?Physical Activity: Not on file  ?Stress: Not on file  ?Social Connections: Not on file  ? ? ? ?Review of Systems  ?Constitutional:  Positive for fatigue. Negative for appetite change and unexpected weight change.  ?HENT:  Negative for congestion and sinus pressure.   ?Respiratory:  Negative for cough and chest tightness.   ?Cardiovascular:   ?     Improved chest pain and palpitations.   ?Gastrointestinal:  Negative for abdominal pain, diarrhea, nausea and vomiting.  ?Genitourinary:  Negative for difficulty urinating and dysuria.  ?Musculoskeletal:  Negative for myalgias.  ?     Still describes stiffness in her joints.   ?Skin:  Negative for rash and wound.  ?Neurological:  Positive for dizziness and light-headedness.  ?Psychiatric/Behavioral:  Negative for agitation and dysphoric  mood.   ? ?   ?Objective:  ?  ? ?BP 126/80 (BP Location: Left Arm, Patient Position: Sitting, Cuff Size: Small)   Pulse 76   Temp 98.3 ?F (36.8 ?C) (Temporal)   Resp 16   Ht 5\' 5"  (1.651 m)   Wt 173 lb 12.8 oz (78.8 kg)   SpO2 98%   BMI 28.92 kg/m?  ?Wt Readings from Last 3 Encounters:  ?02/21/22 173 lb 12.8 oz (78.8 kg)  ?01/03/22 169 lb 8 oz (76.9 kg)  ?12/30/21 171 lb (77.6 kg)  ? ? ?Physical Exam ?Vitals reviewed.  ?Constitutional:   ?   General: She is not in acute distress. ?   Appearance: Normal appearance.  ?HENT:  ?   Head: Normocephalic and atraumatic.  ?   Right Ear: External ear normal.  ?   Left Ear: External ear normal.  ?Eyes:  ?   General: No scleral icterus.    ?   Right eye: No discharge.     ?   Left eye: No  discharge.  ?   Conjunctiva/sclera: Conjunctivae normal.  ?Neck:  ?   Thyroid: No thyromegaly.  ?Cardiovascular:  ?   Rate and Rhythm: Normal rate and regular rhythm.  ?Pulmonary:  ?   Effort: No respiratory distress.  ?   Breath sounds: Normal breath sounds. No wheezing.  ?Abdominal:  ?   General: Bowel sounds are normal.  ?   Palpations: Abdomen is soft.  ?   Tenderness: There is no abdominal tenderness.  ?Musculoskeletal:     ?   General: No swelling or tenderness.  ?   Cervical back: Neck supple. No tenderness.  ?Lymphadenopathy:  ?   Cervical: No cervical adenopathy.  ?Skin: ?   Findings: No erythema or rash.  ?Neurological:  ?   Mental Status: She is alert.  ?Psychiatric:     ?   Mood and Affect: Mood normal.     ?   Behavior: Behavior normal.  ? ? ? ?Outpatient Encounter Medications as of 02/21/2022  ?Medication Sig  ? flecainide (TAMBOCOR) 50 MG tablet Take 1 tablet (50 mg total) by mouth 2 (two) times daily.  ? magnesium oxide (MAG-OX) 400 MG tablet TAKE 1 TABLET BY MOUTH EVERY DAY  ? metoprolol succinate (TOPROL XL) 25 MG 24 hr tablet Take 1 tablet (25 mg total) by mouth daily.  ? Multiple Vitamin (MULTIVITAMIN) tablet Take 1 tablet by mouth daily.  ? pantoprazole (PROTONIX) 40 MG tablet TAKE 1 TABLET BY MOUTH EVERY DAY  ? sertraline (ZOLOFT) 100 MG tablet Take 50 mg by mouth daily.  ? atenolol (TENORMIN) 25 MG tablet Take 1 tablet (25 mg) by mouth once daily (Patient not taking: Reported on 12/30/2021)  ? bisoprolol (ZEBETA) 5 MG tablet Take 0.5 tablets (2.5 mg total) by mouth daily. (Patient not taking: Reported on 12/23/2021)  ? ?No facility-administered encounter medications on file as of 02/21/2022.  ?  ? ?Lab Results  ?Component Value Date  ? WBC 8.5 11/29/2021  ? HGB 13.6 11/29/2021  ? HCT 42.2 11/29/2021  ? PLT 312 11/29/2021  ? GLUCOSE 103 (H) 11/29/2021  ? ALT 25 11/21/2021  ? AST 21 11/21/2021  ? NA 138 11/29/2021  ? K 4.0 11/29/2021  ? CL 107 11/29/2021  ? CREATININE 0.74 11/29/2021  ? BUN 5 (L)  11/29/2021  ? CO2 25 11/29/2021  ? TSH 1.00 11/21/2021  ? ? ?CT CARDIAC SCORING (SELF PAY ONLY) ? ?Addendum Date: 12/15/2021   ?ADDENDUM  REPORT: 12/15/2021 08:08 EXAM: OVER-READ INTERPRETATION  CT CHEST The following report is an over-read performed by radiologist Dr. Darlys GalesJacob Kahnof [facility] on 12/15/2021. This over-read does not include interpretation of cardiac or coronary anatomy or pathology. The coronary calcium score interpretation by the cardiologist is attached. COMPARISON:  Chest CT 09/17/2020. FINDINGS: Vascular: No significant vascular findings. Mediastinum/Nodes: No lymphadenopathy within the field of view. Lungs/Pleura: No focal airspace disease, suspicious pulmonary nodule, pleural effusion, or pneumothorax within the field of view. There is an intrapulmonary lymph node along the right major fissure. Upper Abdomen: No acute abnormality. Musculoskeletal: No chest wall mass or suspicious bone lesions identified within the field of view. IMPRESSION: No acute extracardiac findings in the chest. Electronically Signed   By: Caprice RenshawJacob  Kahn M.D.   On: 12/15/2021 08:08  ? ?Result Date: 12/15/2021 ?CLINICAL DATA:  Risk stratification EXAM: Coronary Calcium Score TECHNIQUE: The patient was scanned on a Siemens Somatom go.Top Scanner. Axial non-contrast 3 mm slices were carried out through the heart. The data set was analyzed on a dedicated work station and scored using the Agatson method. FINDINGS: Non-cardiac: See separate report [facility]. Ascending Aorta: Normal size Pericardium: Normal Coronary arteries: Normal origin of left and right coronary arteries. Distribution of arterial calcifications if present, as noted below; LM 0 LAD 0 LCx 0 RCA 0 Total 0 IMPRESSION AND RECOMMENDATION: 1. Normal coronary calcium score of 0. Patient is low risk for coronary events. 2.  CAC 0, CAC-DRS A0 3.  Continue heart healthy lifestyle and risk factor modification. Debbe OdeaBrian Agbor-Etang Electronically Signed:  By: Debbe OdeaBrian  Agbor-Etang M.D. On: 12/14/2021 16:25  ? ? ?   ?Assessment & Plan:  ? ?Problem List Items Addressed This Visit   ? ? Anxiety  ?  Continues on zoloft.  Followed by Dr Maryruth BunKapur. ? ?  ?  ? Asthma, mild pers

## 2022-02-25 ENCOUNTER — Encounter: Payer: Self-pay | Admitting: Internal Medicine

## 2022-02-25 NOTE — Assessment & Plan Note (Signed)
Persistent joint stiffness.  Saw rheumatology.  Discussed stretching and exercise.  Follow.  ?

## 2022-02-25 NOTE — Assessment & Plan Note (Signed)
Has seen neurology, rheumatology and cardiology.  Also pulmonary evaluation.  Just recently reevaluated by cardiology.   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  She started metoprolol.  Does feel some better.   Continue compression hose.  Continue to stay hydrated.  Will notify me of which neurologist she prefers to see.    ?

## 2022-02-25 NOTE — Assessment & Plan Note (Signed)
Has a persistent feeling of not being able to get a good breath.  Has seen pulmonary.  Diagnosed with mild asthma.  Intolerance to trelegy.  Control anxiety.  Recent calcium score - 0.  Seeing Dr Graciela Husbands.  Started on flecainide. Taking metoprolol.  Feeling some better. Follow.  ?

## 2022-02-25 NOTE — Assessment & Plan Note (Signed)
Continues on zoloft.  Followed by Dr Kapur. 

## 2022-02-25 NOTE — Assessment & Plan Note (Signed)
Has been diagnosed with benign fasciculation syndrome.  Has seen a local neurologist.  She is concerned regarding persistent symptoms.  Would like reevaluation by neurology.  She will notify me which neurologist she prefers to see.   ?

## 2022-02-25 NOTE — Assessment & Plan Note (Signed)
Breathing stable.  Intolerant to trelegy.  

## 2022-03-03 ENCOUNTER — Ambulatory Visit: Payer: Self-pay | Admitting: Internal Medicine

## 2022-03-23 ENCOUNTER — Telehealth: Payer: Self-pay | Admitting: Internal Medicine

## 2022-03-23 ENCOUNTER — Ambulatory Visit: Payer: Self-pay | Admitting: Internal Medicine

## 2022-04-10 ENCOUNTER — Ambulatory Visit: Payer: Self-pay | Admitting: Internal Medicine

## 2022-04-13 ENCOUNTER — Encounter: Payer: Self-pay | Admitting: Internal Medicine

## 2022-04-13 ENCOUNTER — Telehealth (INDEPENDENT_AMBULATORY_CARE_PROVIDER_SITE_OTHER): Payer: Self-pay | Admitting: Internal Medicine

## 2022-04-13 VITALS — BP 114/74 | HR 61 | Ht 65.0 in

## 2022-04-13 DIAGNOSIS — R000 Tachycardia, unspecified: Secondary | ICD-10-CM

## 2022-04-13 DIAGNOSIS — I493 Ventricular premature depolarization: Secondary | ICD-10-CM

## 2022-04-13 NOTE — Patient Instructions (Signed)
Medication Instructions:  - Your physician recommends that you continue on your current medications as directed. Please refer to the Current Medication list given to you today.  *If you need a refill on your cardiac medications before your next appointment, please call your pharmacy*   Lab Work: - none ordered  If you have labs (blood work) drawn today and your tests are completely normal, you will receive your results only by: MyChart Message (if you have MyChart) OR A paper copy in the mail If you have any lab test that is abnormal or we need to change your treatment, we will call you to review the results.   Testing/Procedures: - none ordered   Follow-Up: At [facility], you and your health needs are our priority.  As part of our continuing mission to provide you with exceptional heart care, we have created designated Provider Care Teams.  These Care Teams include your primary Cardiologist (physician) and Advanced Practice Providers (APPs -  Physician Assistants and Nurse Practitioners) who all work together to provide you with the care you need, when you need it.  We recommend signing up for the patient portal called "MyChart".  Sign up information is provided on this After Visit Summary.  MyChart is used to connect with patients for Virtual Visits (Telemedicine).  Patients are able to view lab/test results, encounter notes, upcoming appointments, etc.  Non-urgent messages can be sent to your provider as well.   To learn more about what you can do with MyChart, go to [email or web address].    Your next appointment:   4 month(s)  The format for your next appointment:   In Person  Provider:   Steven Klein, MD    Other Instructions N/a  Important Information About Sugar       

## 2022-04-13 NOTE — Progress Notes (Signed)
Electrophysiology TeleHealth Note   Due to national recommendations of social distancing due to COVID 19, an audio/video telehealth visit is felt to be most appropriate for this patient at this time.  See MyChart message from today for the patient's consent to telehealth for [facility].   Date:  04/13/2022   ID:  Tonya Harris, DOB August 05, 1981, MRN [id number]  Location: patient's home  Provider location: [address] [place]  Evaluation Performed: Follow-up visit  PCP:  Dale Mulberry, MD  Cardiologist:     Electrophysiologist:  SK   Chief Complaint:   PVC  History of Present Illness:    Tonya Harris is a 41 y.o. female who presents via audio/video conferencing for a telehealth visit today.  Since last being seen in our clinic for     Seen in followup for symptomatic "jolts" which we have come to link to PVCs for which we recently started flecainide as well as DOE occurring in the setting of nearly disabling anxiety.  the patient reports  Does not feel normal, but the jolts are better, chest pressure better, no tachypalps on metoprolol and flecainide   BP is also much better--  Anxiety better  Some orthostatic lightheadedness although noteworthy that her orthostatic vital signs in the past have been normal   DATE TEST EF    4/21 Echo   60-65 %    2/23 CTA   Ca Score 0                       The patient denies symptoms of fevers, chills, cough, or new SOB worrisome for COVID 19.     Past Medical History:  Diagnosis Date   Anxiety    Dyspnea-episodic    Gestational diabetes    Palpitations     Past Surgical History:  Procedure Laterality Date   APPENDECTOMY      Current Outpatient Medications  Medication Sig Dispense Refill   flecainide (TAMBOCOR) 50 MG tablet Take 1 tablet (50 mg total) by mouth 2 (two) times daily. (Patient taking differently: Take 50 mg by mouth once.) 60 tablet 6   magnesium oxide (MAG-OX) 400 MG tablet TAKE 1  TABLET BY MOUTH EVERY DAY 90 tablet 1   metoprolol succinate (TOPROL XL) 25 MG 24 hr tablet Take 1 tablet (25 mg total) by mouth daily. 90 tablet 0   Multiple Vitamin (MULTIVITAMIN) tablet Take 1 tablet by mouth daily.     pantoprazole (PROTONIX) 40 MG tablet TAKE 1 TABLET BY MOUTH EVERY DAY 90 tablet 1   sertraline (ZOLOFT) 100 MG tablet Take 50 mg by mouth daily.     No current facility-administered medications for this visit.    Allergies:   Patient has no known allergies.   Social History:  The patient  reports that she has never smoked. She has never used smokeless tobacco. She reports that she does not currently use alcohol. She reports that she does not currently use drugs.   Family History:  The patient's   family history includes Hypertension in her father and mother.   ROS:  Please see the history of present illness.   All other systems are personally reviewed and negative.    Exam:    Vital Signs:  BP 114/74 Comment: patient provided  Pulse 61 Comment: patient provided  Ht 5\' 5"  (1.651 m)   LMP 03/21/2022 (Approximate)   BMI 28.92 kg/m      Well  appearing, alert and conversant, regular work of breathing,  good skin color Eyes- anicteric, neuro- grossly intact, skin- no apparent rash or lesions or cyanosis, mouth- oral mucosa is pink   Labs/Other Tests and Data Reviewed:    Recent Labs: 11/21/2021: ALT 25; Magnesium 1.9; TSH 1.00 11/29/2021: BUN 5; Creatinine, Ser 0.74; Hemoglobin 13.6; Platelets 312; Potassium 4.0; Sodium 138   Wt Readings from Last 3 Encounters:  02/21/22 173 lb 12.8 oz (78.8 kg)  01/03/22 169 lb 8 oz (76.9 kg)  12/30/21 171 lb (77.6 kg)     Other studies personally reviewed: Additional studies/ records that were reviewed today include:      ASSESSMENT & PLAN:    PVCs   Tachypalpitations   BP labile   Significantly improved on flecainide, will continue at 50 mg twice daily and the metoprolol 25 mg a day.  Blood pressure lability  also stabilized.  Anxiety significantly improved.   COVID 19 screen The patient denies symptoms of COVID 19 at this time.  The importance of social distancing was discussed today.  Follow-up:  29m     Current medicines are reviewed at length with the patient today.   The patient  concerns regarding her medicines.  The following changes were made today:    Labs/ tests ordered today include:   No orders of the defined types were placed in this encounter.   Future tests ( post COVID )     Patient Risk:  after full review of this patients clinical status, I feel that they are at moderate risk at this time.  Today, I have spent 21 minutes with the patient with telehealth technology discussing the above.  Signed, Sherryl Manges, MD  04/13/2022 10:20 AM     [facility] [address] [place] [address] [phone] (office) [phone] (fax)

## 2022-04-16 ENCOUNTER — Other Ambulatory Visit: Payer: Self-pay | Admitting: Internal Medicine

## 2022-04-17 MED ORDER — PANTOPRAZOLE SODIUM 40 MG PO TBEC: 40.0000 mg | DELAYED_RELEASE_TABLET | Freq: Every day | ORAL | 1 refills | Status: DC

## 2022-04-17 NOTE — Addendum Note (Signed)
Addended by: Dennie Bible on: 04/17/2022 09:48 AM   Modules accepted: Orders

## 2022-04-24 ENCOUNTER — Ambulatory Visit: Payer: Self-pay | Admitting: Internal Medicine

## 2022-05-02 ENCOUNTER — Other Ambulatory Visit: Payer: Self-pay | Admitting: Internal Medicine

## 2022-05-23 ENCOUNTER — Encounter: Payer: Self-pay | Admitting: Internal Medicine

## 2022-05-23 ENCOUNTER — Ambulatory Visit (INDEPENDENT_AMBULATORY_CARE_PROVIDER_SITE_OTHER): Payer: Self-pay | Admitting: Internal Medicine

## 2022-05-23 DIAGNOSIS — R002 Palpitations: Secondary | ICD-10-CM

## 2022-05-23 DIAGNOSIS — M2560 Stiffness of unspecified joint, not elsewhere classified: Secondary | ICD-10-CM

## 2022-05-23 DIAGNOSIS — F419 Anxiety disorder, unspecified: Secondary | ICD-10-CM

## 2022-05-23 DIAGNOSIS — J4530 Mild persistent asthma, uncomplicated: Secondary | ICD-10-CM

## 2022-05-23 DIAGNOSIS — M79673 Pain in unspecified foot: Secondary | ICD-10-CM

## 2022-05-23 NOTE — Progress Notes (Signed)
Patient ID: Tonya Harris, female   DOB: Jun 27, 1981, 41 y.o.   MRN: [id number]   Subjective:    Patient ID: Tonya Harris, female    DOB: Mar 10, 1981, 41 y.o.   MRN: [id number]   Patient here for a scheduled follow up.  Marland Kitchen   HPI Here to follow up regarding tachycardia, chest pain and fatigue.  Recently evaluated by Dr Graciela Husbands.  On metoprolol and flecainide.  Is feeling some better.  Has helped some symptoms.  Still noticing some finger and hands - swelling.  Some redness/white fingers.  Does feel better walking.  States if has been standing for a while or sitting for a while  and first gets up - notices difficulty initially.  Once moving - better.  Still some twitching.  Also pain - feet.  Worse when first gets up. Appears to be c/w plantar fasciitis.  Discussed orthotics.  Stretches.  Overall does appear to be doing better.    Past Medical History:  Diagnosis Date   Anxiety    Dyspnea-episodic    Gestational diabetes    Palpitations    Past Surgical History:  Procedure Laterality Date   APPENDECTOMY     Family History  Problem Relation Age of Onset   Hypertension Mother    Hypertension Father    Social History   Socioeconomic History   Marital status: Married    Spouse name: Not on file   Number of children: 4   Years of education: Not on file   Highest education level: Master's degree (e.g., MA, MS, MEng, MEd, MSW, MBA)  Occupational History   Occupation: Runner, broadcasting/film/video   Occupation: Stay at home mom  Tobacco Use   Smoking status: Never   Smokeless tobacco: Never  Vaping Use   Vaping Use: Never used  Substance and Sexual Activity   Alcohol use: Not Currently   Drug use: Not Currently   Sexual activity: Yes    Partners: Male  Other Topics Concern   Not on file  Social History Narrative   Right handed      Master's degree in teaching       Stay at 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  HENT:  Negative for congestion and sinus pressure.   Respiratory:  Negative for cough and chest tightness.        Breathing stable.   Cardiovascular:  Negative for leg swelling.       No increased chest pain.  Heart rate/ discomfort - better on current medications.    Gastrointestinal:  Negative for abdominal pain, diarrhea, nausea and vomiting.  Genitourinary:  Negative for difficulty urinating and dysuria.  Musculoskeletal:  Negative for myalgias.       Feels joint swelling as outlined.   Skin:  Negative for color change and rash.  Neurological:  Negative for dizziness, light-headedness and headaches.  Psychiatric/Behavioral:  Negative for agitation and dysphoric mood.        Objective:     BP 124/72 (BP Location: Left Arm, Patient Position: Sitting, Cuff Size: Small)   Pulse 76   Temp 98 F (36.7 C) (Temporal)   Resp 17  Ht 5\' 5"  (1.651 m)   Wt 169 lb (76.7 kg)   SpO2 98%   BMI 28.12 kg/m  Wt Readings from Last 3 Encounters:  05/23/22 169 lb (76.7 kg)  02/21/22 173 lb 12.8 oz (78.8 kg)  01/03/22 169 lb 8 oz (76.9 kg)    Physical Exam Vitals reviewed.  Constitutional:      General: She is not in acute distress.    Appearance: Normal appearance.  HENT:     Head: Normocephalic and atraumatic.     Right Ear: External ear normal.     Left Ear: External ear normal.  Eyes:     General: No scleral icterus.       Right eye: No discharge.        Left eye: No discharge.     Conjunctiva/sclera: Conjunctivae normal.  Neck:     Thyroid: No thyromegaly.  Cardiovascular:     Rate and Rhythm: Normal rate and regular rhythm.  Pulmonary:     Effort: No respiratory distress.     Breath sounds: Normal breath sounds. No wheezing.   Abdominal:     General: Bowel sounds are normal.     Palpations: Abdomen is soft.     Tenderness: There is no abdominal tenderness.  Musculoskeletal:        General: No swelling or tenderness.     Cervical back: Neck supple. No tenderness.  Lymphadenopathy:     Cervical: No cervical adenopathy.  Skin:    Findings: No erythema or rash.  Neurological:     Mental Status: She is alert.  Psychiatric:        Mood and Affect: Mood normal.        Behavior: Behavior normal.      Outpatient Encounter Medications as of 05/23/2022  Medication Sig   flecainide (TAMBOCOR) 50 MG tablet Take 1 tablet (50 mg total) by mouth 2 (two) times daily. (Patient taking differently: Take 50 mg by mouth once.)   magnesium oxide (MAG-OX) 400 MG tablet TAKE 1 TABLET BY MOUTH EVERY DAY   metoprolol succinate (TOPROL-XL) 25 MG 24 hr tablet TAKE 1 TABLET (25 MG TOTAL) BY MOUTH DAILY.   Multiple Vitamin (MULTIVITAMIN) tablet Take 1 tablet by mouth daily.   pantoprazole (PROTONIX) 40 MG tablet Take 1 tablet (40 mg total) by mouth daily.   sertraline (ZOLOFT) 100 MG tablet Take 50 mg by mouth daily.   No facility-administered encounter medications on file as of 05/23/2022.     Lab Results  Component Value Date   WBC 8.5 11/29/2021   HGB 13.6 11/29/2021   HCT 42.2 11/29/2021   PLT 312 11/29/2021   GLUCOSE 103 (H) 11/29/2021   ALT 25 11/21/2021   AST 21 11/21/2021   NA 138 11/29/2021   K 4.0 11/29/2021   CL 107 11/29/2021   CREATININE 0.74 11/29/2021   BUN 5 (L) 11/29/2021   CO2 25 11/29/2021   TSH 1.00 11/21/2021    CT CARDIAC SCORING (SELF PAY ONLY)  Addendum Date: 12/15/2021   ADDENDUM REPORT: 12/15/2021 08:08 EXAM: OVER-READ INTERPRETATION  CT CHEST The following report is an over-read performed by radiologist Dr. 02/12/2022 [facility] on 12/15/2021. This over-read does not include interpretation of cardiac or coronary anatomy or pathology. The coronary calcium score interpretation by  the cardiologist is attached. COMPARISON:  Chest CT 09/17/2020. FINDINGS: Vascular: No significant vascular findings. Mediastinum/Nodes: No lymphadenopathy within the field of view. Lungs/Pleura: No focal airspace disease, suspicious pulmonary nodule, pleural  effusion, or pneumothorax within the field of view. There is an intrapulmonary lymph node along the right major fissure. Upper Abdomen: No acute abnormality. Musculoskeletal: No chest wall mass or suspicious bone lesions identified within the field of view. IMPRESSION: No acute extracardiac findings in the chest. Electronically Signed   By: Caprice Renshaw M.D.   On: 12/15/2021 08:08   Result Date: 12/15/2021 CLINICAL DATA:  Risk stratification EXAM: Coronary Calcium Score TECHNIQUE: The patient was scanned on a Siemens Somatom go.Top Scanner. Axial non-contrast 3 mm slices were carried out through the heart. The data set was analyzed on a dedicated work station and scored using the [place] method. FINDINGS: Non-cardiac: See separate report from [facility]. Ascending Aorta: Normal size Pericardium: Normal Coronary arteries: Normal origin of left and right coronary arteries. Distribution of arterial calcifications if present, as noted below; LM 0 LAD 0 LCx 0 RCA 0 Total 0 IMPRESSION AND RECOMMENDATION: 1. Normal coronary calcium score of 0. Patient is low risk for coronary events. 2.  CAC 0, CAC-DRS A0 3.  Continue heart healthy lifestyle and risk factor modification. Debbe Odea Electronically Signed: By: Debbe Odea M.D. On: 12/14/2021 16:25       Assessment & Plan:   Problem List Items Addressed This Visit     Anxiety    Continues on zoloft.  Followed by Dr Maryruth Bun.      Asthma, mild persistent    Breathing stable.  Intolerant to trelegy.       Foot pain    Foot pain as outlined.  Discussed orthotics and stretches.  Refer to podiatry for further evaluation and treatment.       Relevant Orders   Ambulatory referral to  Podiatry   Joint stiffness    Persistent joint stiffness.  Saw rheumatology.  Discussed stretching and exercise.  Follow. Will hold on further evaluation.       Palpitations    Overall doing better.  Continues on metoprolol.  On flecainide.  Follow.  Continue f/u with Dr Graciela Husbands.         Dale [place], MD

## 2022-05-28 ENCOUNTER — Encounter: Payer: Self-pay | Admitting: Internal Medicine

## 2022-05-28 DIAGNOSIS — M79673 Pain in unspecified foot: Secondary | ICD-10-CM | POA: Insufficient documentation

## 2022-05-28 NOTE — Assessment & Plan Note (Signed)
Foot pain as outlined.  Discussed orthotics and stretches.  Refer to podiatry for further evaluation and treatment.

## 2022-05-28 NOTE — Assessment & Plan Note (Signed)
Continues on zoloft.  Followed by Dr Kapur. 

## 2022-05-28 NOTE — Assessment & Plan Note (Signed)
Breathing stable.  Intolerant to trelegy.  

## 2022-05-28 NOTE — Assessment & Plan Note (Signed)
Persistent joint stiffness.  Saw rheumatology.  Discussed stretching and exercise.  Follow. Will hold on further evaluation.

## 2022-05-28 NOTE — Assessment & Plan Note (Signed)
Overall doing better.  Continues on metoprolol.  On flecainide.  Follow.  Continue f/u with Dr Klein.  

## 2022-06-02 ENCOUNTER — Encounter: Payer: Self-pay | Admitting: Internal Medicine

## 2022-06-02 ENCOUNTER — Telehealth (INDEPENDENT_AMBULATORY_CARE_PROVIDER_SITE_OTHER): Payer: Self-pay | Admitting: Internal Medicine

## 2022-06-02 DIAGNOSIS — M2560 Stiffness of unspecified joint, not elsewhere classified: Secondary | ICD-10-CM

## 2022-06-02 DIAGNOSIS — I8393 Asymptomatic varicose veins of bilateral lower extremities: Secondary | ICD-10-CM

## 2022-06-02 DIAGNOSIS — R5383 Other fatigue: Secondary | ICD-10-CM

## 2022-06-02 DIAGNOSIS — J4530 Mild persistent asthma, uncomplicated: Secondary | ICD-10-CM

## 2022-06-02 DIAGNOSIS — R531 Weakness: Secondary | ICD-10-CM

## 2022-06-02 DIAGNOSIS — F419 Anxiety disorder, unspecified: Secondary | ICD-10-CM

## 2022-06-02 NOTE — Progress Notes (Unsigned)
Patient ID: Tonya Harris, female   DOB: [birth year], 41 y.o.   MRN: [id number]   Virtual Visit via telephone Note  This format is felt to be most appropriate for this patient at this time.  All issues noted in this document were discussed and addressed.  No physical exam was performed (except for noted visual exam findings with Video Visits).   I connected with Tonya Harris by telephone and verified that I am speaking with the correct person using two identifiers. Location patient: home Location provider: work  Persons participating in the telephone visit: patient, provider  The limitations, risks, security and privacy concerns of performing an evaluation and management service by telephone and the availability of in person appointments have been discussed.  It has also been discussed with the patient that there may be a patient responsible charge related to this service. The patient expressed understanding and agreed to proceed.   Reason for visit: work in appt  HPI: Work in appt to discuss some ongoing issues and the possibility of going to work.  She had reported symptoms have improved on metoprolol and flecainide.  Still with some persistent symptoms.  She feels symptoms worse - fingers turn red, feels weak.  Had questions regarding blood flow - when standing.  No syncope.  Eating.  No nausea or vomiting reported.  No abdominal pain.  Bowels moving.  Discussed work.  Stress.  She was questioning returning to neurology for reevaluation.    ROS: See pertinent positives and negatives per HPI.  Past Medical History:  Diagnosis Date   Anxiety    Dyspnea-episodic    Gestational diabetes    Palpitations     Past Surgical History:  Procedure Laterality Date   APPENDECTOMY      Family History  Problem Relation Age of Onset   Hypertension Mother    Hypertension Father     SOCIAL HX: reviewed.    Current Outpatient Medications:    flecainide (TAMBOCOR) 50 MG tablet, Take 1  tablet (50 mg total) by mouth 2 (two) times daily. (Patient taking differently: Take 50 mg by mouth once.), Disp: 60 tablet, Rfl: 6   magnesium oxide (MAG-OX) 400 MG tablet, TAKE 1 TABLET BY MOUTH EVERY DAY, Disp: 90 tablet, Rfl: 1   metoprolol succinate (TOPROL-XL) 25 MG 24 hr tablet, TAKE 1 TABLET (25 MG TOTAL) BY MOUTH DAILY., Disp: 90 tablet, Rfl: 0   Multiple Vitamin (MULTIVITAMIN) tablet, Take 1 tablet by mouth daily., Disp: , Rfl:    pantoprazole (PROTONIX) 40 MG tablet, Take 1 tablet (40 mg total) by mouth daily., Disp: 90 tablet, Rfl: 1   sertraline (ZOLOFT) 100 MG tablet, Take 50 mg by mouth daily., Disp: , Rfl:   EXAM:  GENERAL: alert.  Sounds to be in no acute distress.  Answering questions appropriately.   PSYCH/NEURO: pleasant and cooperative, no obvious depression or anxiety, speech and thought processing grossly intact  ASSESSMENT AND PLAN:  Discussed the following assessment and plan:  Problem List Items Addressed This Visit     Anxiety    Continues on zoloft.  Followed by Dr Maryruth Bun.      Asthma, mild persistent    Breathing stable.  Intolerant to trelegy.       Fatigue    Has felt some better since starting flecainide and metoprolol.  Still with symptoms as outlined.  Discussed f/u with Dr Graciela Husbands. She was also questioning f/u with neurology.  Will call if desires to pursue.  Joint stiffness    Some persistent joint stiffness and sensation changes.  Has seen neurology and rheumatology.  Discussed f/u.  Will call if desires to pursue.        Varicose veins of both lower extremities    Continue compression hose.       Weakness    Has seen neurology, rheumatology and cardiology.  Also pulmonary evaluation.  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 On metoprolol and flecainide.  Continue compression hose.  Continue to stay hydrated.  Will notify me of which neurologist she prefers to see.          Return if symptoms worsen or fail to improve,  for keep scheduled.   I discussed the assessment and treatment plan with the patient. The patient was provided an opportunity to ask questions and all were answered. The patient agreed with the plan and demonstrated an understanding of the instructions.   The patient was advised to call back or seek an in-person evaluation if the symptoms worsen or if the condition fails to improve as anticipated.  I provided 25 minutes of non-face-to-face time during this encounter.   Dale Woodworth, MD

## 2022-06-04 NOTE — Assessment & Plan Note (Signed)
Has felt some better since starting flecainide and metoprolol.  Still with symptoms as outlined.  Discussed f/u with Dr Graciela Husbands. She was also questioning f/u with neurology.  Will call if desires to pursue.

## 2022-06-04 NOTE — Assessment & Plan Note (Signed)
Some persistent joint stiffness and sensation changes.  Has seen neurology and rheumatology.  Discussed f/u.  Will call if desires to pursue.

## 2022-06-04 NOTE — Assessment & Plan Note (Signed)
Has seen neurology, rheumatology and cardiology.  Also pulmonary evaluation.  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 On metoprolol and flecainide.  Continue compression hose.  Continue to stay hydrated.  Will notify me of which neurologist she prefers to see.

## 2022-06-04 NOTE — Assessment & Plan Note (Signed)
Breathing stable.  Intolerant to trelegy.  

## 2022-06-04 NOTE — Assessment & Plan Note (Signed)
Continues on zoloft.  Followed by Dr Maryruth Bun.

## 2022-06-04 NOTE — Assessment & Plan Note (Signed)
Continue compression hose.   

## 2022-06-07 ENCOUNTER — Ambulatory Visit: Payer: Self-pay | Admitting: Internal Medicine

## 2022-06-15 ENCOUNTER — Ambulatory Visit: Payer: Self-pay | Admitting: Internal Medicine

## 2022-07-04 ENCOUNTER — Ambulatory Visit: Payer: Self-pay | Admitting: Internal Medicine

## 2022-07-25 ENCOUNTER — Encounter: Payer: Self-pay | Admitting: Internal Medicine

## 2022-08-17 ENCOUNTER — Other Ambulatory Visit: Payer: Self-pay | Admitting: Internal Medicine

## 2022-09-18 ENCOUNTER — Encounter: Payer: Self-pay | Admitting: Internal Medicine

## 2022-09-18 ENCOUNTER — Other Ambulatory Visit (HOSPITAL_COMMUNITY)
Admission: AD | Admit: 2022-09-18 | Discharge: 2022-09-18 | Disposition: A | Discharge status: Home / Self Care | Payer: Self-pay | Source: Ambulatory Visit | Attending: Internal Medicine | Admitting: Internal Medicine

## 2022-09-18 ENCOUNTER — Ambulatory Visit (INDEPENDENT_AMBULATORY_CARE_PROVIDER_SITE_OTHER): Payer: Self-pay | Admitting: Internal Medicine

## 2022-09-18 VITALS — BP 122/76 | HR 76 | Temp 97.9°F | Resp 15 | Ht 65.0 in | Wt 164.4 lb

## 2022-09-18 DIAGNOSIS — Z124 Encounter for screening for malignant neoplasm of cervix: Secondary | ICD-10-CM

## 2022-09-18 DIAGNOSIS — Z23 Encounter for immunization: Secondary | ICD-10-CM

## 2022-09-18 DIAGNOSIS — M79673 Pain in unspecified foot: Secondary | ICD-10-CM

## 2022-09-18 DIAGNOSIS — Z0000 Encounter for general adult medical examination without abnormal findings: Secondary | ICD-10-CM

## 2022-09-18 DIAGNOSIS — Z13220 Encounter for screening for lipoid disorders: Secondary | ICD-10-CM

## 2022-09-18 DIAGNOSIS — R002 Palpitations: Secondary | ICD-10-CM

## 2022-09-18 DIAGNOSIS — F419 Anxiety disorder, unspecified: Secondary | ICD-10-CM

## 2022-09-18 DIAGNOSIS — R5383 Other fatigue: Secondary | ICD-10-CM

## 2022-09-18 DIAGNOSIS — R030 Elevated blood-pressure reading, without diagnosis of hypertension: Secondary | ICD-10-CM

## 2022-09-18 DIAGNOSIS — J4530 Mild persistent asthma, uncomplicated: Secondary | ICD-10-CM

## 2022-09-18 DIAGNOSIS — I8393 Asymptomatic varicose veins of bilateral lower extremities: Secondary | ICD-10-CM

## 2022-09-18 NOTE — Progress Notes (Signed)
Patient ID: Tonya Harris, female   DOB: [birth year], 41 y.o.   MRN: [id number]   Subjective:    Patient ID: Tonya Harris, female    DOB: Oct 16, 1981, 41 y.o.   MRN: [id number]   Patient here for physical exam.   HPI Reports she is doing better overall.  Working.  In more of a routine.  This appears to have helped.     Past Medical History:  Diagnosis Date   Anxiety    Dyspnea-episodic    Gestational diabetes    Palpitations    Past Surgical History:  Procedure Laterality Date   APPENDECTOMY     Family History  Problem Relation Age of Onset   Hypertension Mother    Hypertension Father    Social History   Socioeconomic History   Marital status: Married    Spouse name: Not on file   Number of children: 4   Years of education: Not on file   Highest education level: Master's degree (e.g., MA, MS, MEng, MEd, MSW, MBA)  Occupational History   Occupation: Pharmacist, hospital   Occupation: Stay at home mom  Tobacco Use   Smoking status: Never   Smokeless tobacco: Never  Vaping Use   Vaping Use: Never used  Substance and Sexual Activity   Alcohol use: Not Currently   Drug use: Not Currently   Sexual activity: Yes    Partners: Male  Other Topics Concern   Not on file  Social History Narrative   Right handed      Master's degree in teaching       Stay at home mom of 4 children currently   Social Determinants of Health   Financial Resource Strain: Not on file  Food Insecurity: Not on file  Transportation Needs: Not on file  Physical Activity: Not on file  Stress: Not on file  Social Connections: Not on file     Review of Systems     Objective:     BP 122/76 (BP Location: Left Arm, Patient Position: Sitting, Cuff Size: Small)   Pulse 76   Temp 97.9 F (36.6 C) (Temporal)   Resp 15   Ht _0  (1.651 m)   Wt 164 lb 6.4 oz (74.6 kg)   SpO2 99%   BMI 27.36 kg/m  Wt Readings from Last 3 Encounters:  09/18/22 164 lb 6.4 oz (74.6 kg)  06/02/22 169 lb (76.7  kg)  05/23/22 169 lb (76.7 kg)    Physical Exam   Outpatient Encounter Medications as of 09/18/2022  Medication Sig   flecainide (TAMBOCOR) 50 MG tablet Take 1 tablet (50 mg total) by mouth 2 (two) times daily. (Patient taking differently: Take 50 mg by mouth once.)   magnesium oxide (MAG-OX) 400 MG tablet TAKE 1 TABLET BY MOUTH EVERY DAY   metoprolol succinate (TOPROL-XL) 25 MG 24 hr tablet Take 1 tablet (25 mg total) by mouth daily.   Multiple Vitamin (MULTIVITAMIN) tablet Take 1 tablet by mouth daily.   pantoprazole (PROTONIX) 40 MG tablet Take 1 tablet (40 mg total) by mouth daily.   sertraline (ZOLOFT) 100 MG tablet Take 50 mg by mouth daily.   No facility-administered encounter medications on file as of 09/18/2022.     Lab Results  Component Value Date   WBC 6.4 09/18/2022   HGB 12.1 09/18/2022   HCT 35.9 (L) 09/18/2022   PLT 269.0 09/18/2022   GLUCOSE 74 09/18/2022   CHOL 147 09/18/2022   TRIG 55.0 09/18/2022  HDL 31.90 (L) 09/18/2022   LDLCALC 105 (H) 09/18/2022   ALT 19 09/18/2022   AST 20 09/18/2022   NA 137 09/18/2022   K 3.9 09/18/2022   CL 103 09/18/2022   CREATININE 0.70 09/18/2022   BUN 10 09/18/2022   CO2 26 09/18/2022   TSH 0.97 09/18/2022    CT CARDIAC SCORING (SELF PAY ONLY)  Addendum Date: 12/15/2021   ADDENDUM REPORT: 12/15/2021 08:08 EXAM: OVER-READ INTERPRETATION  CT CHEST The following report is an over-read performed by radiologist Dr. Jason Nest [facility] on 12/15/2021. This over-read does not include interpretation of cardiac or coronary anatomy or pathology. The coronary calcium score interpretation by the cardiologist is attached. COMPARISON:  Chest CT 09/17/2020. FINDINGS: Vascular: No significant vascular findings. Mediastinum/Nodes: No lymphadenopathy within the field of view. Lungs/Pleura: No focal airspace disease, suspicious pulmonary nodule, pleural effusion, or pneumothorax within the field of view. There is an  intrapulmonary lymph node along the right major fissure. Upper Abdomen: No acute abnormality. Musculoskeletal: No chest wall mass or suspicious bone lesions identified within the field of view. IMPRESSION: No acute extracardiac findings in the chest. Electronically Signed   By: Maurine Simmering M.D.   On: 12/15/2021 08:08   Result Date: 12/15/2021 CLINICAL DATA:  Risk stratification EXAM: Coronary Calcium Score TECHNIQUE: The patient was scanned on a Siemens Somatom go.Top Scanner. Axial non-contrast 3 mm slices were carried out through the heart. The data set was analyzed on a dedicated work station and scored using the [place]. FINDINGS: Non-cardiac: See separate report from [facility]. Ascending Aorta: Normal size Pericardium: Normal Coronary arteries: Normal origin of left and right coronary arteries. Distribution of arterial calcifications if present, as noted below; LM 0 LAD 0 LCx 0 RCA 0 Total 0 IMPRESSION AND RECOMMENDATION: 1. Normal coronary calcium score of 0. Patient is low risk for coronary events. 2.  CAC 0, CAC-DRS A0 3.  Continue heart healthy lifestyle and risk factor modification. Kate Sable Electronically Signed: By: Kate Sable M.D. On: 12/14/2021 16:25       Assessment & Plan:   Problem List Items Addressed This Visit     Anxiety    Continues on zoloft.  Followed by Dr Nicolasa Ducking. Overall appears to be doing better.       Asthma, mild persistent    Breathing stable.  Intolerant to trelegy.       Elevated blood pressure reading    Spot check pressures.  Follow pressures.  Follow metabolic panel.       Relevant Orders   Comp Met (CMET) (Completed)   Fatigue    Has felt some better since starting flecainide and metoprolol.  Still with symptoms as outlined.  Check routine labs.        Relevant Orders   CBC w/Diff (Completed)   TSH (Completed)   Foot pain    Had previously placed referral to podiatry.  She is ready for appt.        Healthcare  maintenance    Physical today 09/22/22.  Will schedule mammogram when ready.  Discussed yearly mammograms starting at age 31.       Palpitations    Overall doing better.  Continues on metoprolol.  On flecainide.  Follow.  Continue f/u with Dr Caryl Comes.       Varicose veins of both lower extremities    Continue compression hose.       Other Visit Diagnoses     Routine general medical examination at  a health care facility    -  Primary   Need for influenza vaccination       Relevant Orders   Flu Vaccine QUAD 6+ mos PF IM (Fluarix Quad PF)   Screening for cervical cancer       Relevant Orders   Cytology - PAP( [place]) (Completed)   Screening for cholesterol level       Relevant Orders   Lipid Profile (Completed)        Einar Pheasant, MD

## 2022-09-19 LAB — CBC WITH DIFFERENTIAL/PLATELET
Basophils Absolute: 0 10*3/uL (ref 0.0–0.1)
Basophils Relative: 0.7 % (ref 0.0–3.0)
Eosinophils Absolute: 0 10*3/uL (ref 0.0–0.7)
Eosinophils Relative: 0.5 % (ref 0.0–5.0)
HCT: 35.9 % — ABNORMAL LOW (ref 36.0–46.0)
Hemoglobin: 12.1 g/dL (ref 12.0–15.0)
Lymphocytes Relative: 31.2 % (ref 12.0–46.0)
Lymphs Abs: 2 10*3/uL (ref 0.7–4.0)
MCHC: 33.7 g/dL (ref 30.0–36.0)
MCV: 85.6 fl (ref 78.0–100.0)
Monocytes Absolute: 0.4 10*3/uL (ref 0.1–1.0)
Monocytes Relative: 5.9 % (ref 3.0–12.0)
Neutro Abs: 3.9 10*3/uL (ref 1.4–7.7)
Neutrophils Relative %: 61.7 % (ref 43.0–77.0)
Platelets: 269 10*3/uL (ref 150.0–400.0)
RBC: 4.2 Mil/uL (ref 3.87–5.11)
RDW: 13.5 % (ref 11.5–15.5)
WBC: 6.4 10*3/uL (ref 4.0–10.5)

## 2022-09-19 LAB — COMPREHENSIVE METABOLIC PANEL
ALT: 19 U/L (ref 0–35)
AST: 20 U/L (ref 0–37)
Albumin: 4.4 g/dL (ref 3.5–5.2)
Alkaline Phosphatase: 29 U/L — ABNORMAL LOW (ref 39–117)
BUN: 10 mg/dL (ref 6–23)
CO2: 26 mEq/L (ref 19–32)
Calcium: 9 mg/dL (ref 8.4–10.5)
Chloride: 103 mEq/L (ref 96–112)
Creatinine, Ser: 0.7 mg/dL (ref 0.40–1.20)
GFR: 108.96 mL/min (ref >60.00)
Glucose, Bld: 74 mg/dL (ref 70–99)
Potassium: 3.9 mEq/L (ref 3.5–5.1)
Sodium: 137 mEq/L (ref 135–145)
Total Bilirubin: 0.7 mg/dL (ref 0.2–1.2)
Total Protein: 6.6 g/dL (ref 6.0–8.3)

## 2022-09-19 LAB — LIPID PANEL
Cholesterol: 147 mg/dL (ref 0–200)
HDL: 31.9 mg/dL — ABNORMAL LOW (ref >39.00)
LDL Cholesterol: 105 mg/dL — ABNORMAL HIGH (ref 0–99)
NonHDL: 115.52
Total CHOL/HDL Ratio: 5
Triglycerides: 55 mg/dL (ref 0.0–149.0)
VLDL: 11 mg/dL (ref 0.0–40.0)

## 2022-09-19 LAB — TSH: TSH: 0.97 u[IU]/mL (ref 0.35–5.50)

## 2022-09-20 ENCOUNTER — Telehealth: Payer: Self-pay

## 2022-09-20 LAB — CYTOLOGY - PAP
Comment: NEGATIVE
Diagnosis: NEGATIVE
High risk HPV: NEGATIVE

## 2022-09-20 NOTE — Telephone Encounter (Signed)
LM FOR PT TO CB RE :   Notify - cholesterol levels are ok.  Good cholesterol is decreased from goal, but remainder of cholesterol panel ok.  Exercise will increase good cholesterol.  Hgb, thyroid test, kidney function tests and liver function tests are wnl.

## 2022-09-20 NOTE — Telephone Encounter (Signed)
Pt returning call and I read message to her but she would like clarification

## 2022-09-21 NOTE — Telephone Encounter (Signed)
L/M FOR PT. TO C/B.

## 2022-09-30 ENCOUNTER — Encounter: Payer: Self-pay | Admitting: Internal Medicine

## 2022-09-30 ENCOUNTER — Telehealth: Payer: Self-pay | Admitting: Internal Medicine

## 2022-09-30 NOTE — Assessment & Plan Note (Signed)
Has felt some better since starting flecainide and metoprolol.  Still with symptoms as outlined.  Check routine labs.

## 2022-09-30 NOTE — Assessment & Plan Note (Signed)
Continue compression hose.   

## 2022-09-30 NOTE — Assessment & Plan Note (Signed)
Had previously placed referral to podiatry.  She is ready for appt.

## 2022-09-30 NOTE — Assessment & Plan Note (Signed)
Physical today 09/22/22.  Will schedule mammogram when ready.  Discussed yearly mammograms starting at age 39.

## 2022-09-30 NOTE — Assessment & Plan Note (Signed)
Breathing stable.  Intolerant to trelegy.

## 2022-09-30 NOTE — Assessment & Plan Note (Signed)
Overall doing better.  Continues on metoprolol.  On flecainide.  Follow.  Continue f/u with Dr Graciela Husbands.

## 2022-09-30 NOTE — Assessment & Plan Note (Signed)
Spot check pressures.  Follow pressures.  Follow metabolic panel.

## 2022-09-30 NOTE — Telephone Encounter (Signed)
Have placed order for podiatry referral.  Pt is ready to schedule.  Do I need to place new order. Just let me know.  Thanks.

## 2022-09-30 NOTE — Assessment & Plan Note (Signed)
Continues on zoloft.  Followed by Dr Maryruth Bun. Overall appears to be doing better.

## 2022-10-05 ENCOUNTER — Encounter: Payer: Self-pay | Admitting: Internal Medicine

## 2022-10-05 ENCOUNTER — Ambulatory Visit: Payer: Self-pay | Attending: Internal Medicine | Admitting: Internal Medicine

## 2022-10-05 VITALS — BP 106/72 | HR 67 | Ht 65.0 in | Wt 165.0 lb

## 2022-10-05 DIAGNOSIS — I951 Orthostatic hypotension: Secondary | ICD-10-CM

## 2022-10-05 DIAGNOSIS — R000 Tachycardia, unspecified: Secondary | ICD-10-CM

## 2022-10-05 DIAGNOSIS — I493 Ventricular premature depolarization: Secondary | ICD-10-CM

## 2022-10-05 NOTE — Patient Instructions (Addendum)
Medication Instructions:  - Your physician has recommended you make the following change in your medication:   1) INCREASE Flecainide 50 mg: - take 1 tablet by mouth TWICE daily (or every 12 hours)   *If you need a refill on your cardiac medications before your next appointment, please call your pharmacy*   Lab Work: - none ordered  If you have labs (blood work) drawn today and your tests are completely normal, you will receive your results only by: MyChart Message (if you have MyChart) OR A paper copy in the mail If you have any lab test that is abnormal or we need to change your treatment, we will call you to review the results.   Testing/Procedures: - none ordered   Follow-Up: At [facility], you and your health needs are our priority.  As part of our continuing mission to provide you with exceptional heart care, we have created designated Provider Care Teams.  These Care Teams include your primary Cardiologist (physician) and Advanced Practice Providers (APPs -  Physician Assistants and Nurse Practitioners) who all work together to provide you with the care you need, when you need it.  We recommend signing up for the patient portal called "MyChart".  Sign up information is provided on this After Visit Summary.  MyChart is used to connect with patients for Virtual Visits (Telemedicine).  Patients are able to view lab/test results, encounter notes, upcoming appointments, etc.  Non-urgent messages can be sent to your provider as well.   To learn more about what you can do with MyChart, go to [email or web address].    Your next appointment:   6 month(s)  The format for your next appointment:   In Person  Provider:   Sherryl Manges, MD    Other Instructions  Discussed salt substitutes with a  goal of 2-3 gms of Sodium or 5-7 gm of sodium Chloride daily.    Rehydration solutions include Liquid IV, NUUN, TriOral, Normralyte pedialyte advanced care and Banana Bags.    Salt tablets include plain salt tablets, SaltStick Vitassium, thermatabs amongst others.   The higher sodium concentration per serving on this list is normalyte about 800 mg of sodium per serving LMNT1000 mg and most recently of worried about Within You hydration formula also at 1000 mg  Salt supplements are best used with adjunctive sugar  Also discussed the role of compression wear including thigh sleeves and abdominal binders.  Calf compression is not specifically recommended..   Important Information About Sugar

## 2022-10-05 NOTE — Progress Notes (Signed)
Patient Care Team: Dale [place], MD as PCP - General (Internal Medicine) Jodelle Red, MD as PCP - Cardiology (Cardiology) Drema [place], DO as Consulting Physician (Neurology)   HPI  Tonya Harris is a 41 y.o. 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   DOE occurring in the setting of nearly disabling anxiety.    Also complains of orthostatic "not feeling" aggravated by showers and illness.  Diet is salt deplete and fluid replete.  Some exercise tachy palpitations   DATE TEST EF   4/21 Echo   60-65 %   2/23 CTA  Ca Score 0             Date Cr K Hgb  11/23 0.7 3.9 12.1           Records and Results Reviewed   Past Medical History:  Diagnosis Date   Anxiety    Dyspnea-episodic    Gestational diabetes    Palpitations     Past Surgical History:  Procedure Laterality Date   APPENDECTOMY      Current Meds  Medication Sig   flecainide (TAMBOCOR) 50 MG tablet Take 50 mg by mouth once.   magnesium oxide (MAG-OX) 400 MG tablet TAKE 1 TABLET BY MOUTH EVERY DAY   metoprolol succinate (TOPROL-XL) 25 MG 24 hr tablet Take 1 tablet (25 mg total) by mouth daily.   Multiple Vitamin (MULTIVITAMIN) tablet Take 1 tablet by mouth daily.   pantoprazole (PROTONIX) 40 MG tablet Take 1 tablet (40 mg total) by mouth daily.   sertraline (ZOLOFT) 100 MG tablet Take 50 mg by mouth daily.    No Known Allergies    Review of Systems negative except from HPI and PMH  Physical Exam BP 106/72 (BP Location: Left Arm, Patient Position: Sitting, Cuff Size: Normal)   Pulse 67   Ht 5\' 5"  (1.651 m)   Wt 165 lb (74.8 kg)   SpO2 97%   BMI 27.46 kg/m  Well developed and nourished in no acute distress HENT normal Neck supple Clear Regular rate and rhythm, no  murmurs or gallops Abd-soft with active BS No Clubbing cyanosis edema Skin-warm and dry A & Oriented  Grossly normal sensory and motor function  ECG sinus at 67 Intervals 15/10/41  Estimated Creatinine Clearance: 95.5 mL/min (by C-G formula based on SCr of 0.7 mg/dL).   Assessment and  Plan PVCs  Tachypalpitations  BP labile  Dysautonomia?  Her PVCs are much improved on flecainide, will continue 50 twice daily.  I.e. she will have to increase it from daily to twice daily.  Has symptoms that may well be dysautonomia with heat and shower and orthostatic intolerance. We discussed extensively the issues of dysautonomia, the physiology of orthstasis and positional stress.  We discussed the role of salt and water repletion, the importance of exercise, often needing to be started in the recumbent position, and the awareness of triggers and the role of ambient heat and dehydration   Discussed salt substitutes with a  goal of 1-*2 gms of Sodium or 4-5 gm of sodium Chloride daily.  Rehydration solutions include Liquid IV, NUUN, TriOral, Normralyte pedialyte advanced care and Banana Bags.  Salt tablets include plain salt tablets, SaltStick Vitassium, thermatabs amongst others.   The higher sodium concentration per  serving on this list is normalyte about 800 mg of sodium per serving LMNT1000 mg and most recently of worried about Within You hydration formula also at 1000 mg  Salt supplements are best used with adjunctive sugar  Also discussed the role of compression wear including thigh sleeves and abdominal binders.  Calf compression is not specifically recommended.. .   Current twice daily.  Medicines are reviewed at length with the patient today .  The patient does not  have concerns regarding medicines.

## 2022-12-18 ENCOUNTER — Ambulatory Visit: Payer: Self-pay | Admitting: Internal Medicine

## 2022-12-22 ENCOUNTER — Ambulatory Visit: Payer: Self-pay | Admitting: Internal Medicine

## 2023-01-02 ENCOUNTER — Ambulatory Visit: Payer: Self-pay | Admitting: Internal Medicine

## 2023-01-18 ENCOUNTER — Ambulatory Visit: Payer: Self-pay | Admitting: Internal Medicine

## 2023-01-24 ENCOUNTER — Ambulatory Visit: Payer: Self-pay | Admitting: Dermatology

## 2023-02-13 ENCOUNTER — Telehealth: Payer: Self-pay

## 2023-02-13 ENCOUNTER — Encounter: Payer: Self-pay | Admitting: Internal Medicine

## 2023-02-13 NOTE — Telephone Encounter (Signed)
LM for patient to call me back. 

## 2023-02-16 ENCOUNTER — Ambulatory Visit: Payer: Self-pay | Admitting: Internal Medicine

## 2023-04-12 ENCOUNTER — Ambulatory Visit: Payer: Self-pay | Admitting: Internal Medicine

## 2023-04-17 DIAGNOSIS — I493 Ventricular premature depolarization: Secondary | ICD-10-CM | POA: Insufficient documentation

## 2023-04-17 DIAGNOSIS — G901 Familial dysautonomia [Riley-Day]: Secondary | ICD-10-CM | POA: Insufficient documentation

## 2023-04-19 ENCOUNTER — Ambulatory Visit: Payer: Self-pay | Attending: Internal Medicine | Admitting: Cardiology

## 2023-04-19 ENCOUNTER — Encounter: Payer: Self-pay | Admitting: Cardiology

## 2023-04-19 ENCOUNTER — Other Ambulatory Visit: Payer: Self-pay | Admitting: Internal Medicine

## 2023-04-19 VITALS — HR 66 | Ht 65.0 in | Wt 163.0 lb

## 2023-04-19 DIAGNOSIS — G901 Familial dysautonomia [Riley-Day]: Secondary | ICD-10-CM

## 2023-04-19 DIAGNOSIS — I493 Ventricular premature depolarization: Secondary | ICD-10-CM

## 2023-04-19 NOTE — Progress Notes (Signed)
Cardiology Office Note Date:  04/19/2023  Patient ID:  Tonya Harris, Agan March 03, 1981, MRN [id number] PCP:  Dale Olivia, MD  Cardiologist:  Jodelle Red, MD Electrophysiologist: Sherryl Manges, MD    Chief Complaint: PVC  History of Present Illness: Tonya Harris is a 42 y.o. female with PMH notable for palpitations, PVCs, ?dysautonomia; seen today for Sherryl Manges, MD for routine electrophysiology followup.  She last saw Dr. Graciela Husbands 09/2022, had been previosuly started on flecainide for PVC suppression.  She was also describing symptoms that may be dysautonomia, and he recommended increasing salt and fluid consumption.  Today, she states that she feels overall much better than she did prior.  She has rare palpitations, nothing as severe as she used to feel.  She drinks 1 liquid IV per day, sometimes 2. She did not increase flecainide to 50 mg twice daily as instructed at last visit.  Has continued to take 50 mg daily   she denies chest pain, dyspnea, PND, orthopnea, nausea, vomiting, dizziness, syncope, edema, weight gain, or early satiety.     AAD History: Flecainide  Past Medical History:  Diagnosis Date   Anxiety    Dyspnea-episodic    Gestational diabetes    Palpitations     Past Surgical History:  Procedure Laterality Date   APPENDECTOMY      Current Outpatient Medications  Medication Instructions   flecainide (TAMBOCOR) 25 mg, 2 times daily   magnesium oxide (MAG-OX) 400 MG tablet TAKE 1 TABLET BY MOUTH EVERY DAY   metoprolol succinate (TOPROL-XL) 25 mg, Oral, Daily   Multiple Vitamin (MULTIVITAMIN) tablet 1 tablet, Oral, Daily   sertraline (ZOLOFT) 50 mg, Oral, Daily    Social History:  The patient  reports that she has never smoked. She has never used smokeless tobacco. She reports that she does not currently use alcohol. She reports that she does not currently use drugs.   Family History:  The patient's family history includes Hypertension in  her father and mother.  ROS:  Please see the history of present illness. All other systems are reviewed and otherwise negative.   PHYSICAL EXAM:  VS:  Pulse 66   Ht 5\' 5"  (1.651 m)   Wt 163 lb (73.9 kg)   SpO2 99%   BMI 27.12 kg/m  BMI: Body mass index is 27.12 kg/m.  Orthostatic VS for the past 24 hrs (Last 3 readings):  BP- Lying Pulse- Lying BP- Sitting Pulse- Sitting BP- Standing at 0 minutes Pulse- Standing at 0 minutes BP- Standing at 3 minutes Pulse- Standing at 3 minutes  04/19/23 0944 108/72 68 110/72 73 121/80 73 120/76 72    GEN- The patient is well appearing, alert and oriented x 3 today.   Lungs- Clear to ausculation bilaterally, normal work of breathing.  Heart- Regular rate and rhythm, no murmurs, rubs or gallops Extremities- No peripheral edema, warm, dry   EKG is ordered. Personal review of EKG from today shows:  NSR, rate 66 PR - 154 QRS 92  Previous EKG 10/05/2022 NSR, rate 67 PR - 148 QRS 104  Recent Labs: 09/18/2022: ALT 19; BUN 10; Creatinine, Ser 0.70; Hemoglobin 12.1; Platelets 269.0; Potassium 3.9; Sodium 137; TSH 0.97  09/18/2022: Cholesterol 147; HDL 31.90; LDL Cholesterol 105; Total CHOL/HDL Ratio 5; Triglycerides 55.0; VLDL 11.0   CrCl cannot be calculated (Patient's most recent lab result is older than the maximum 21 days allowed.).   Wt Readings from Last 3 Encounters:  04/19/23 163 lb (73.9 kg)  10/05/22 165 lb (74.8 kg)  09/18/22 164 lb 6.4 oz (74.6 kg)     Additional studies reviewed include: Previous EP, cardiology notes.   Cardiac CT, 12/14/2021 1. Normal coronary calcium score of 0. Patient is low risk for coronary events. 2.  CAC 0, CAC-DRS A0 3.  Continue heart healthy lifestyle and risk factor modification.  Long Term monitor, 11/03/2021 Patient had a min HR of 51 bpm, max HR of 157 bpm, and avg HR of 82 bpm. Predominant underlying rhythm was Sinus Rhythm. Isolated SVEs were rare (<1.0%), SVE Couplets were rare (<1.0%), and  no SVE Triplets were present. Isolated VEs were rare (<1.0%), VE Couplets were rare (<1.0%), and no VE Triplets were present. Ventricular Trigeminy was present.    Symptoms of "JOLTS" were mostly assoc with PVCs    ASSESSMENT AND PLAN:  #) PVCs #) Palpitations Much improved since addition of flecainide EKG with stable intervals Continue flecainide 25mg  BID Continue toprol 25mg  daily   #) dysautonomia She is feeling much better with addition of daily liquid IV Is not wearing compression socks, can consider if symptoms return   Current medicines are reviewed at length with the patient today.   The patient does not have concerns regarding her medicines.  The following changes were made today:  none  Labs/ tests ordered today include:  Orders Placed This Encounter  Procedures   EKG 12-Lead     Disposition: Follow up with Dr. Graciela Husbands or EP APP in 6 months   Signed, Sherie Don, NP  04/19/23  11:53 AM  Electrophysiology [facility]

## 2023-04-19 NOTE — Patient Instructions (Addendum)
Medication Instructions:  DECREASE flecainide 50 mg 1/2 tablet by mouth twice a day  *If you need a refill on your cardiac medications before your next appointment, please call your pharmacy*   Lab Work: No labs ordered  If you have labs (blood work) drawn today and your tests are completely normal, you will receive your results only by: MyChart Message (if you have MyChart) OR A paper copy in the mail If you have any lab test that is abnormal or we need to change your treatment, we will call you to review the results.   Testing/Procedures: No testing ordered  Follow-Up: At [facility], you and your health needs are our priority.  As part of our continuing mission to provide you with exceptional heart care, we have created designated Provider Care Teams.  These Care Teams include your primary Cardiologist (physician) and Advanced Practice Providers (APPs -  Physician Assistants and Nurse Practitioners) who all work together to provide you with the care you need, when you need it.  We recommend signing up for the patient portal called "MyChart".  Sign up information is provided on this After Visit Summary.  MyChart is used to connect with patients for Virtual Visits (Telemedicine).  Patients are able to view lab/test results, encounter notes, upcoming appointments, etc.  Non-urgent messages can be sent to your provider as well.   To learn more about what you can do with MyChart, go to [email or web address].    Your next appointment:   6 month(s)  Provider:   Sherryl Manges, MD or Sherie Don, NP

## 2023-04-20 ENCOUNTER — Ambulatory Visit: Payer: Self-pay | Admitting: Internal Medicine

## 2023-05-16 IMAGING — CT CT HEAD W/O CM
4 series · 16 of 47 positions shown, 18 images · non-contrast
Comparison: MRI brain 05/17/2021

CLINICAL DATA: Sudden onset headache. Hypertension and dizziness.
Fatigue.



[Series 3: head without · axial · non-contrast · 0.45mm/px · z∈[-94,+36]mm · 7 of 36 slices shown, 9 images]
[im 5/36  brain]
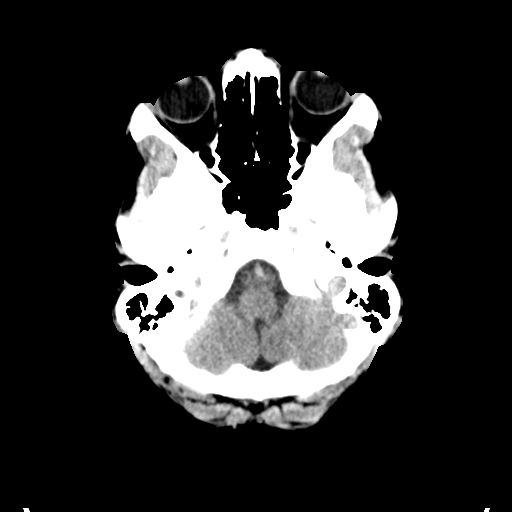
[im 5/36  bone]
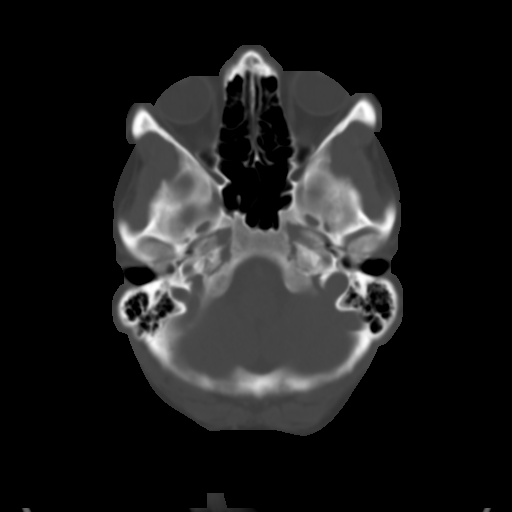
[im 9/36  brain]
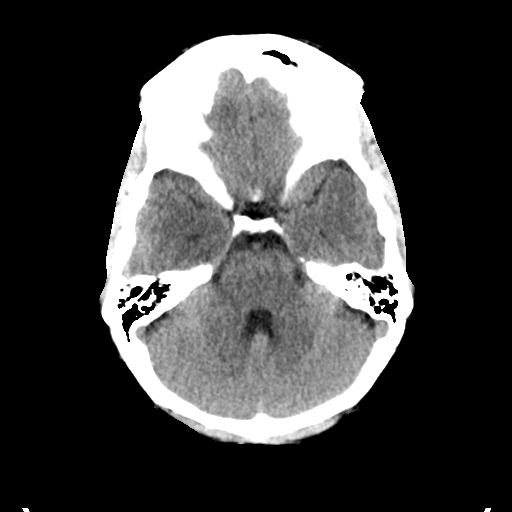
[im 14/36  brain]
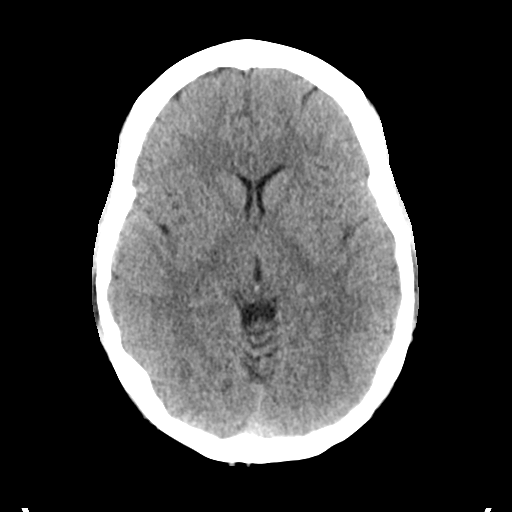
[im 18/36  brain]
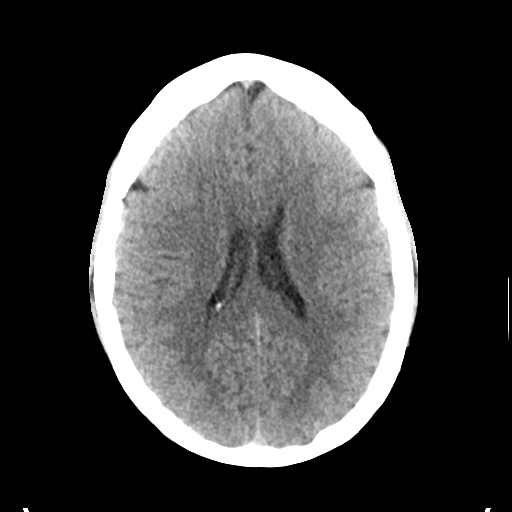
[im 22/36  brain]
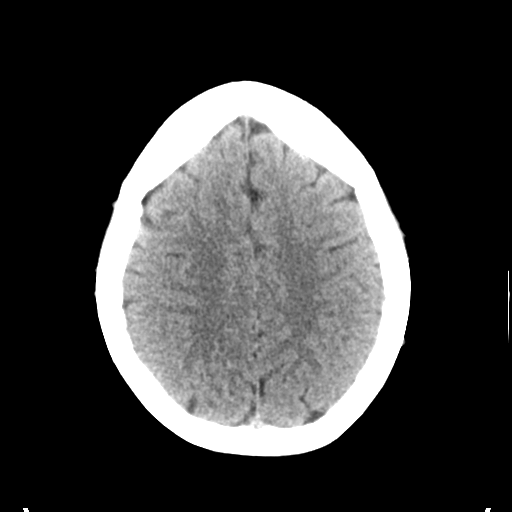
[im 22/36  bone]
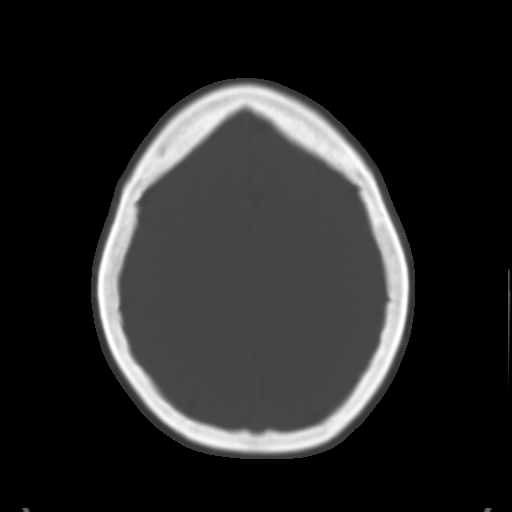
[im 27/36  brain]
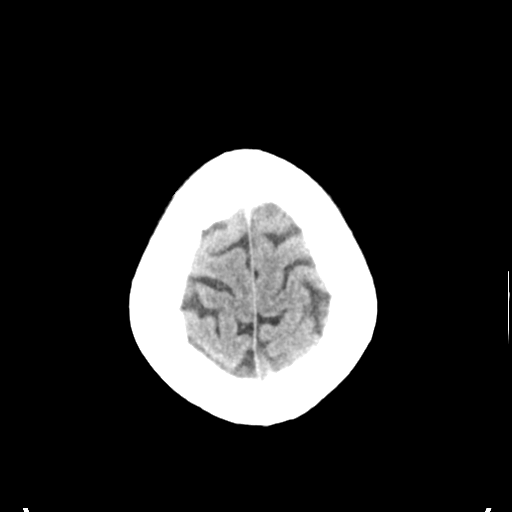
[im 31/36  brain]
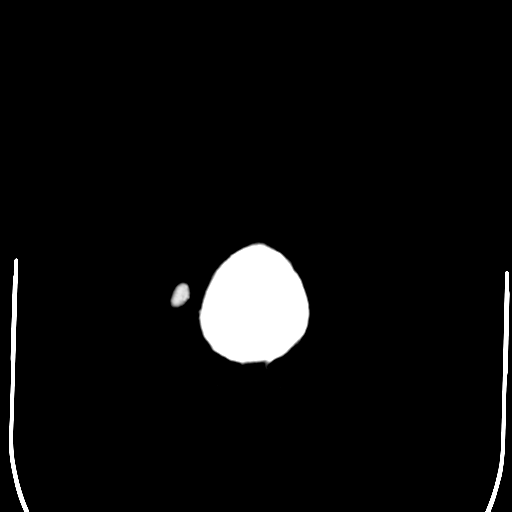

[Series 4: head bone · axial · 0.45mm/px · z∈[-98,-62]mm · 3 of 90 slices shown]
[im 9/90  bone]
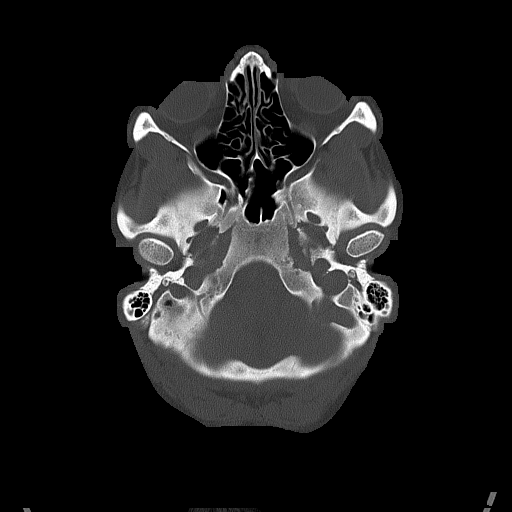
[im 18/90  bone]
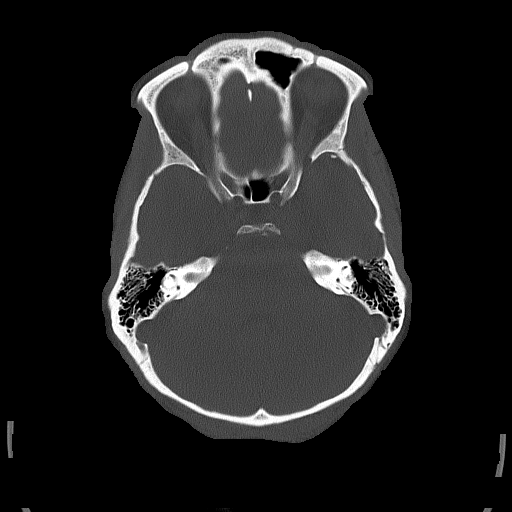
[im 27/90  bone]
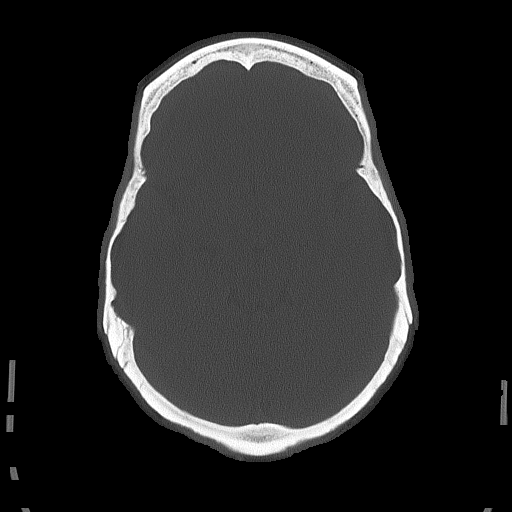

[Series 5: head without cor · coronal · non-contrast · 0.32mm/px · 3 of 71 slices shown]
[im 24/71  brain]
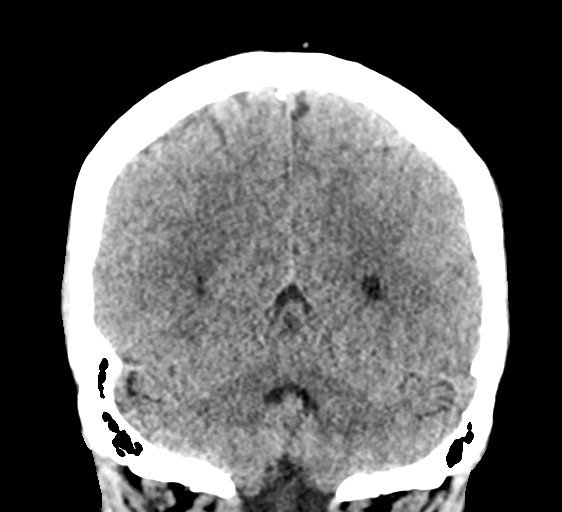
[im 32/71  brain]
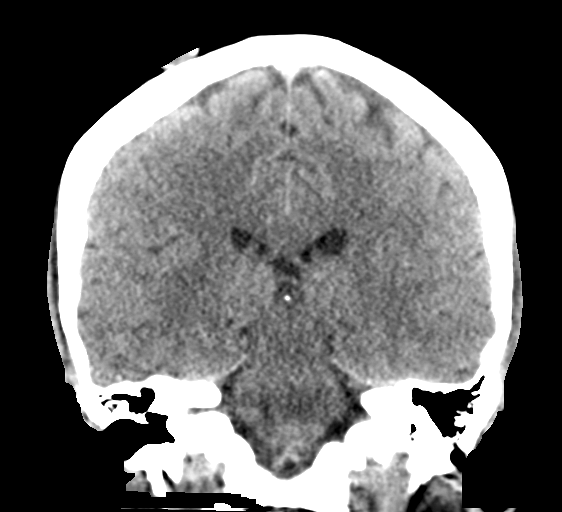
[im 39/71  brain]
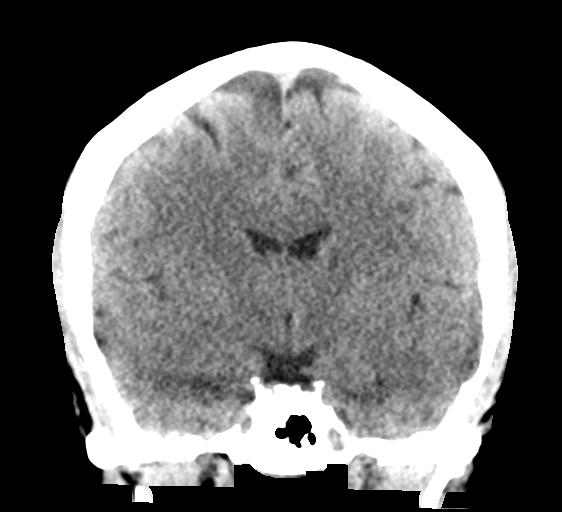

[Series 6: head without sag · sagittal · non-contrast · 0.33mm/px · 3 of 60 slices shown]
[im 20/60  brain]
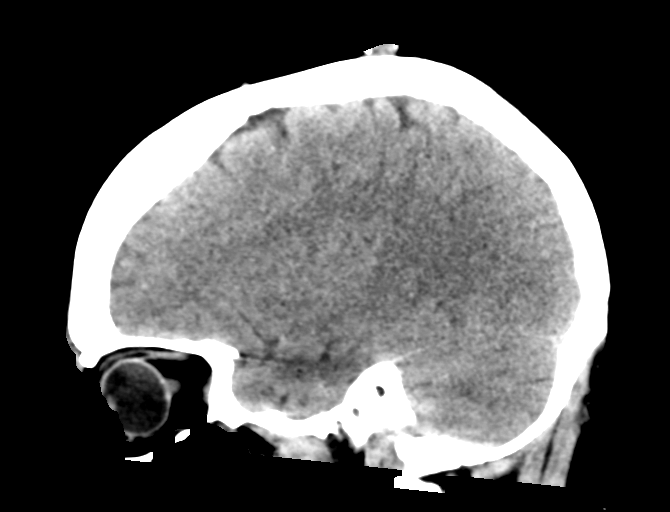
[im 30/60  brain]
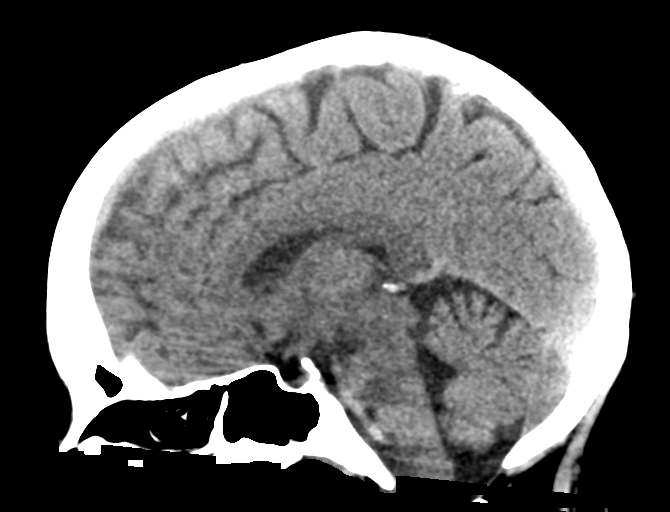
[im 40/60  brain]
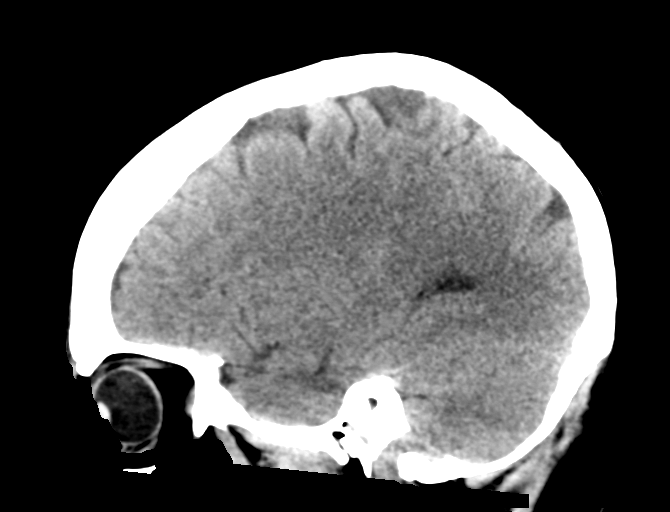

[16 of 47 positions shown; findings below may reference images not displayed]

FINDINGS: Brain: The brainstem, cerebellum, cerebral peduncles, thalami, basal
ganglia, basilar cisterns, and ventricular system appear within
normal limits. No intracranial hemorrhage, mass lesion, or acute
CVA.

Vascular: Unremarkable

Skull: Unremarkable

Sinuses/Orbits: Unremarkable

Other: Stable right vertex scalp lesion, probably a sebaceous cyst
or similar benign lesion.
IMPRESSION: 1. No significant intracranial abnormality identified.

## 2023-05-24 ENCOUNTER — Ambulatory Visit: Payer: Self-pay | Admitting: Internal Medicine

## 2023-05-28 ENCOUNTER — Encounter: Payer: Self-pay | Admitting: Internal Medicine

## 2023-05-28 ENCOUNTER — Ambulatory Visit (INDEPENDENT_AMBULATORY_CARE_PROVIDER_SITE_OTHER): Payer: Self-pay

## 2023-05-28 ENCOUNTER — Ambulatory Visit (INDEPENDENT_AMBULATORY_CARE_PROVIDER_SITE_OTHER): Payer: Self-pay | Admitting: Podiatry

## 2023-05-28 ENCOUNTER — Encounter: Payer: Self-pay | Admitting: Podiatry

## 2023-05-28 DIAGNOSIS — M722 Plantar fascial fibromatosis: Secondary | ICD-10-CM

## 2023-05-28 MED ORDER — TRIAMCINOLONE ACETONIDE 10 MG/ML IJ SUSP: 2.5000 mg | Freq: Once | INTRAMUSCULAR | Status: AC

## 2023-05-28 MED ORDER — DEXAMETHASONE SODIUM PHOSPHATE 120 MG/30ML IJ SOLN: 4.0000 mg | Freq: Once | INTRAMUSCULAR | Status: AC

## 2023-05-28 MED ORDER — MELOXICAM 15 MG PO TABS: 15.0000 mg | ORAL_TABLET | Freq: Every day | ORAL | 0 refills | Status: DC

## 2023-05-28 NOTE — Progress Notes (Signed)
  Subjective:  Patient ID: Tonya Harris, female    DOB: 1981-05-19,   MRN: [id number]  Chief Complaint  Patient presents with   Foot Pain    Patient came in today for bilateral heel pain, and lateral side of the  top of the foot, started year ago, rate of pain 8 out of 10 at the worse, mornings and after sitting for a long time, sharp, X-Rays done today     42 y.o. female presents for concern as above. Deneis any treatments. Relates right maybe slightly worse . Denies any other pedal complaints. Denies n/v/f/c.   Past Medical History:  Diagnosis Date   Anxiety    Dyspnea-episodic    Gestational diabetes    Palpitations     Objective:  Physical Exam: Vascular: DP/PT pulses 2/4 bilateral. CFT <3 seconds. Normal hair growth on digits. No edema.  Skin. No lacerations or abrasions bilateral feet.  Musculoskeletal: MMT 5/5 bilateral lower extremities in DF, PF, Inversion and Eversion. Deceased ROM in DF of ankle joint. Tender to medial calcaneal tubercle bilateral. No pain with calcaneal squeeze. No pain along PT achilles or arch bilateral.  Neurological: Sensation intact to light touch.   Assessment:   1. Plantar fasciitis, bilateral      Plan:  Patient was evaluated and treated and all questions answered. Discussed plantar fasciitis with patient.  X-rays reviewed and discussed with patient. No acute fractures or dislocations noted. Mild spurring noted at inferior calcaneus mostly on right.  Discussed treatment options including, ice, NSAIDS, supportive shoes, bracing, and stretching. Stretching exercises provided to be done on a daily basis.   Prescription for meloxicam provided and sent to pharmacy. Patient requesting injection today. Procedure note below.   PF brace bilateral dispensed.  Follow-up 6 weeks or sooner if any problems arise. In the meantime, encouraged to call the office with any questions, concerns, change in symptoms.   Procedure:  Discussed etiology,  pathology, conservative vs. surgical therapies. At this time a plantar fascial injection was recommended.  The patient agreed and a sterile skin prep was applied.  An injection consisting of  1cc dexamethasone 0.5 cc kenalog and 1cc marcaine mixture was infiltrated at the point of maximal tenderness on the bilateral Heel.  Bandaid applied. The patient tolerated this well and was given instructions for aftercare.     Louann Sjogren, DPM

## 2023-05-28 NOTE — Patient Instructions (Signed)

## 2023-05-31 ENCOUNTER — Ambulatory Visit: Payer: Self-pay | Admitting: Internal Medicine

## 2023-05-31 IMAGING — CT CT CARDIAC CORONARY ARTERY CALCIUM SCORE
3 series · 14 of 20 positions shown, 16 images · non-contrast
Comparison: Chest CT 09/17/2020.

Addendum:
CLINICAL DATA: Risk stratification

EXAM:
Coronary Calcium Score
TECHNIQUE: The patient was scanned on a Siemens Somatom go.Top Scanner. Axial
non-contrast 3 mm slices were carried out through the heart. The
data set was analyzed on a dedicated work station and scored using
the Agatson method.

[Series 2: sa36 calcium scoring 3.00 · axial · 0.30mm/px · z∈[-1097,-1016]mm · 4 of 46 slices shown]
[im 10/46  vessel]
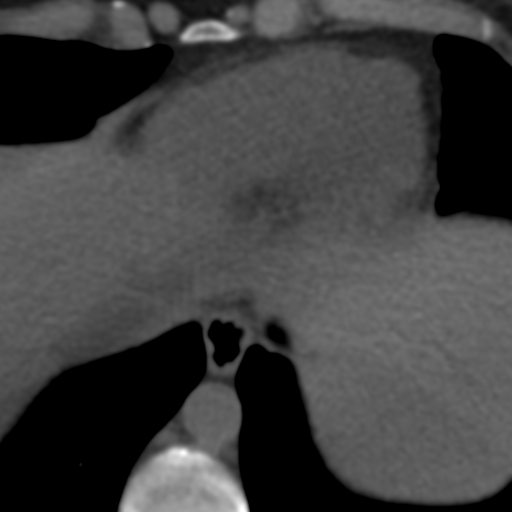
[im 19/46  vessel]
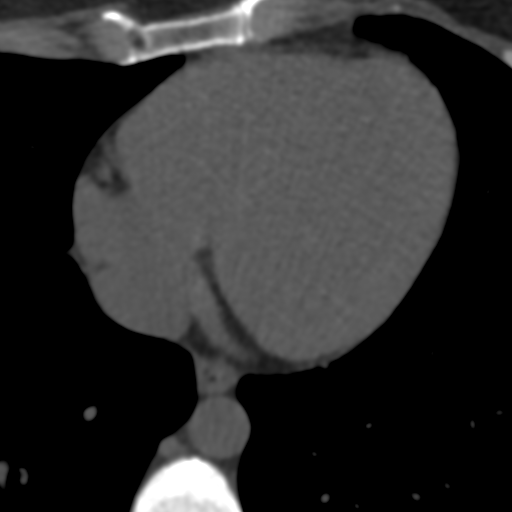
[im 28/46  vessel]
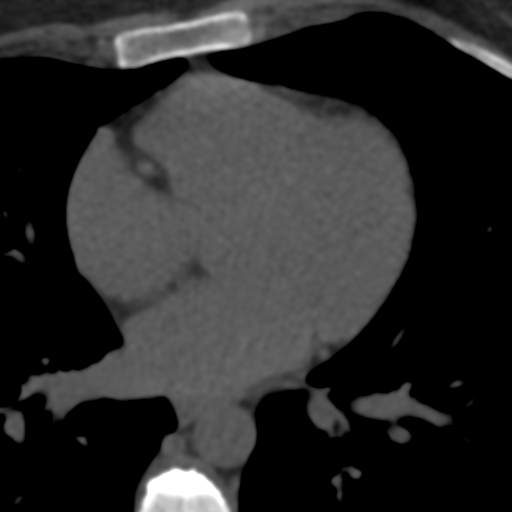
[im 37/46  vessel]
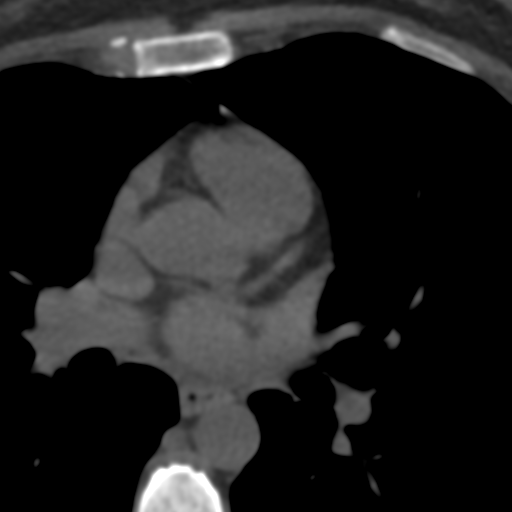

[Series 5: full fov st calcium scoring 3.00 · axial · 0.57mm/px · z∈[-1103,-1013]mm · 5 of 46 slices shown, 7 images]
[im 8/46  vessel]
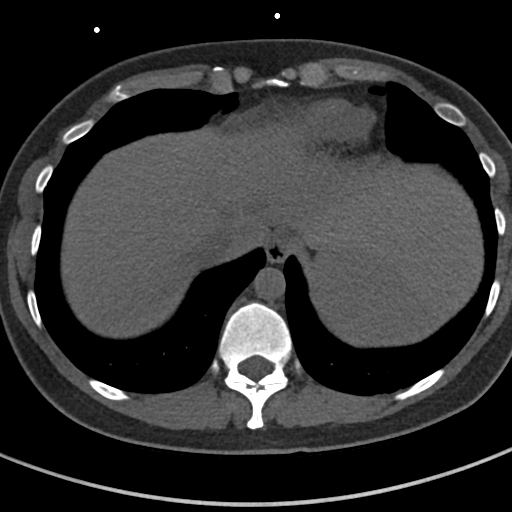
[im 8/46  lung]
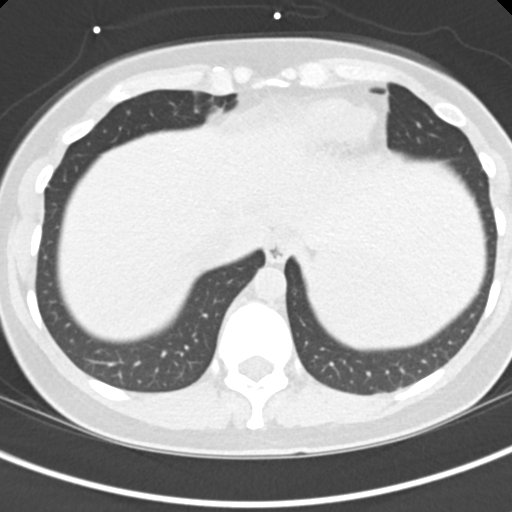
[im 16/46  vessel]
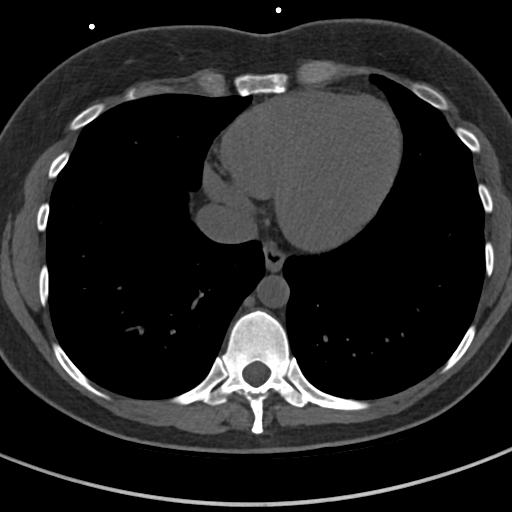
[im 23/46  vessel]
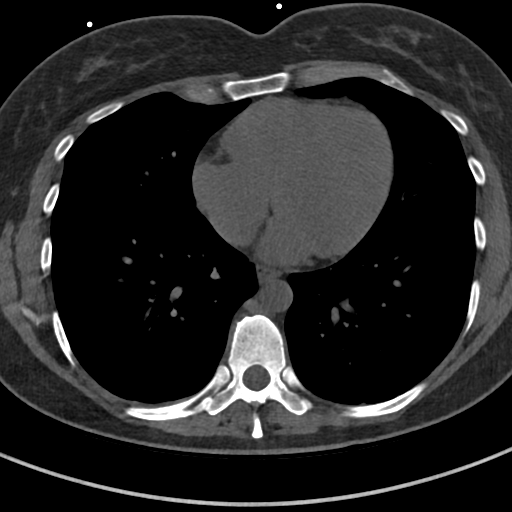
[im 31/46  vessel]
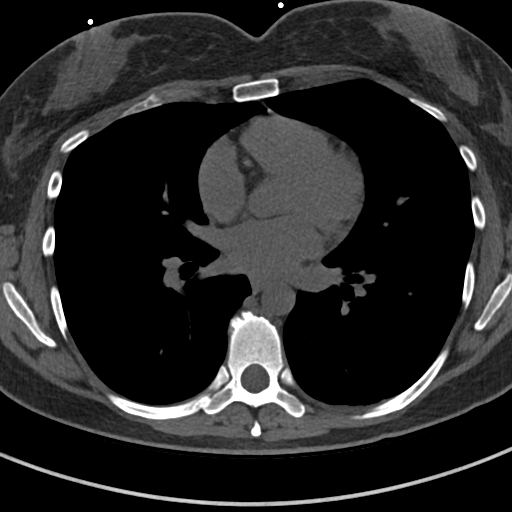
[im 38/46  vessel]
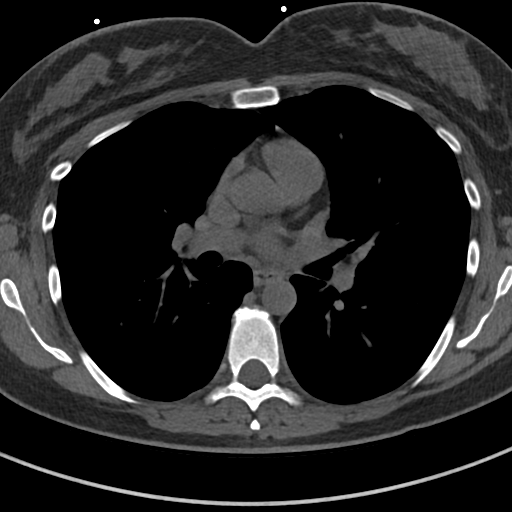
[im 38/46  lung]
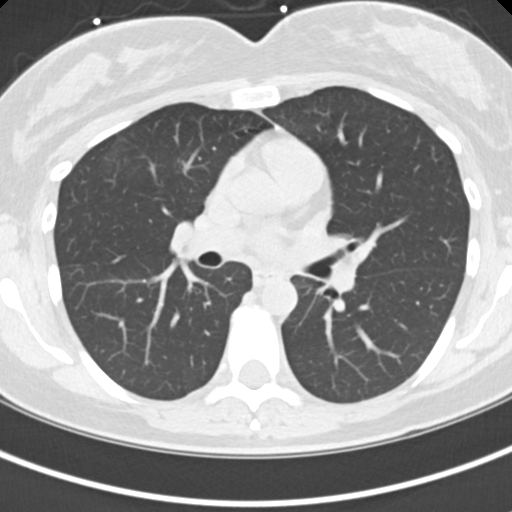

[Series 10: full fov lungs calcium scoring 3.00 ax · axial · 0.57mm/px · z∈[-1103,-1013]mm · 5 of 46 slices shown]
[im 8/46  vessel]
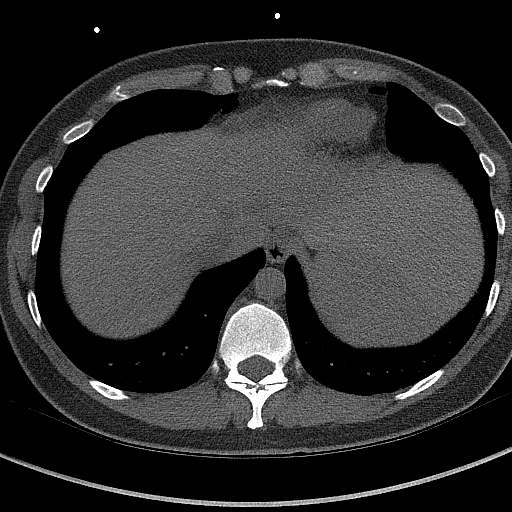
[im 16/46  vessel]
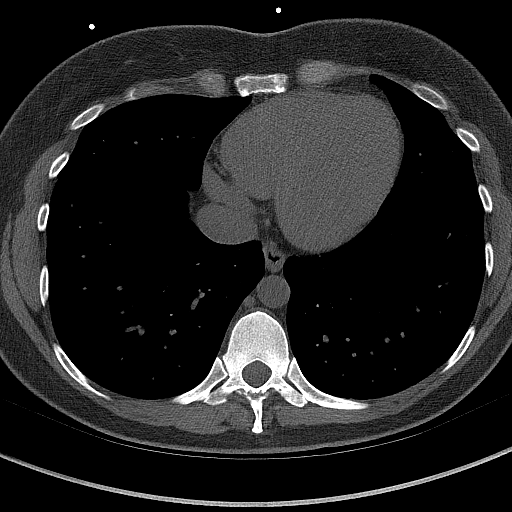
[im 23/46  vessel]
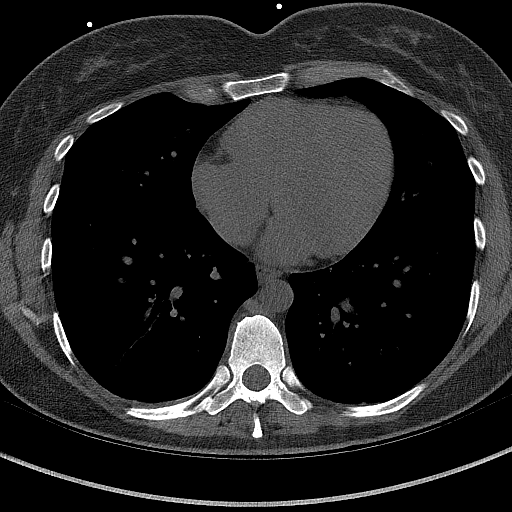
[im 31/46  vessel]
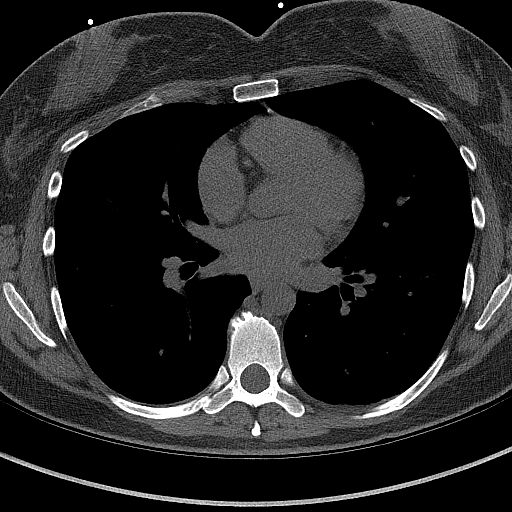
[im 38/46  vessel]
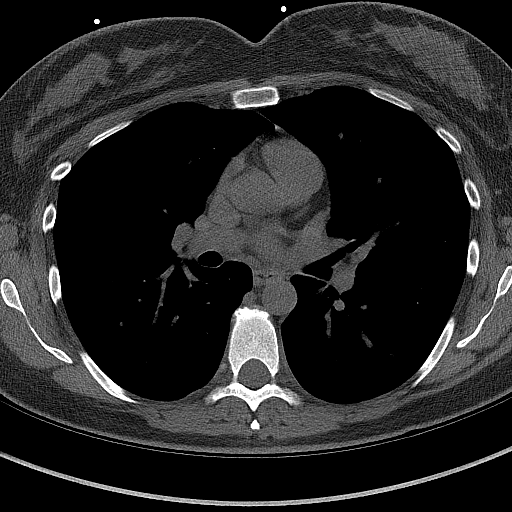

[14 of 20 positions shown; findings below may reference images not displayed]

FINDINGS: Non-cardiac: See separate report from [REDACTED].

Ascending Aorta: Normal size

Pericardium: Normal

Coronary arteries: Normal origin of left and right coronary
arteries. Distribution of arterial calcifications if present, as
noted below;

LM 0

LAD 0

LCx 0

RCA 0

Total 0

IMPRESSION AND RECOMMENDATION:
1. Normal coronary calcium score of 0. Patient is low risk for
coronary events.

2.  CAC 0, MONIKA DOMINIK MUNIA0

3.  Continue heart healthy lifestyle and risk factor modification.

Arissa Billiot

EXAM:
OVER-READ INTERPRETATION  CT CHEST

The following report is an over-read performed by radiologist Dr.
does not include interpretation of cardiac or coronary anatomy or
pathology. The coronary calcium score interpretation by the
cardiologist is attached.
FINDINGS: Vascular: No significant vascular findings.

Mediastinum/Nodes: No lymphadenopathy within the field of view.

Lungs/Pleura: No focal airspace disease, suspicious pulmonary
nodule, pleural effusion, or pneumothorax within the field of view.
There is an intrapulmonary lymph node along the right major fissure.

Upper Abdomen: No acute abnormality.

Musculoskeletal: No chest wall mass or suspicious bone lesions
identified within the field of view.
IMPRESSION: No acute extracardiac findings in the chest.

*** End of Addendum ***
FINDINGS: Non-cardiac: See separate report from [REDACTED].

Ascending Aorta: Normal size

Pericardium: Normal

Coronary arteries: Normal origin of left and right coronary
arteries. Distribution of arterial calcifications if present, as
noted below;

LM 0

LAD 0

LCx 0

RCA 0

Total 0

IMPRESSION AND RECOMMENDATION:
1. Normal coronary calcium score of 0. Patient is low risk for
coronary events.

2.  CAC 0, MONIKA DOMINIK MUNIA0

3.  Continue heart healthy lifestyle and risk factor modification.

Arissa Billiot

## 2023-06-21 ENCOUNTER — Encounter (INDEPENDENT_AMBULATORY_CARE_PROVIDER_SITE_OTHER): Payer: Self-pay

## 2023-06-24 ENCOUNTER — Other Ambulatory Visit: Payer: Self-pay | Admitting: Podiatry

## 2023-07-11 ENCOUNTER — Ambulatory Visit (INDEPENDENT_AMBULATORY_CARE_PROVIDER_SITE_OTHER): Payer: Self-pay | Admitting: Podiatry

## 2023-07-11 ENCOUNTER — Encounter: Payer: Self-pay | Admitting: Podiatry

## 2023-07-11 DIAGNOSIS — M722 Plantar fascial fibromatosis: Secondary | ICD-10-CM

## 2023-07-11 NOTE — Progress Notes (Signed)
  Subjective:  Patient ID: Tonya Harris, female    DOB: 06-Jun-1981,   MRN: [id number]  Chief Complaint  Patient presents with   Plantar Fasciitis    b/l pf 6 wks    42 y.o. female presents for follow-up of bilateral plantar fasciitis. Relates doing a whole lot better. Injections and meloxicam have helped and has been stretching and wearing brace. Denies any other pedal complaints. Denies n/v/f/c.   Past Medical History:  Diagnosis Date   Anxiety    Dyspnea-episodic    Gestational diabetes    Palpitations     Objective:  Physical Exam: Vascular: DP/PT pulses 2/4 bilateral. CFT <3 seconds. Normal hair growth on digits. No edema.  Skin. No lacerations or abrasions bilateral feet.  Musculoskeletal: MMT 5/5 bilateral lower extremities in DF, PF, Inversion and Eversion. Deceased ROM in DF of ankle joint. Minimally tender to medial calcaneal tubercle bilateral. No pain with calcaneal squeeze. No pain along PT achilles or arch bilateral.  Neurological: Sensation intact to light touch.   Assessment:   1. Plantar fasciitis, bilateral      Plan:  Patient was evaluated and treated and all questions answered. Discussed plantar fasciitis with patient.  X-rays reviewed and discussed with patient. No acute fractures or dislocations noted. Mild spurring noted at inferior calcaneus mostly on right.  Discussed treatment options including, ice, NSAIDS, supportive shoes, bracing, and stre tching. Continue bracing and stretching.  Discussed CMO  Follow-up as needed.     Louann Sjogren, DPM

## 2023-07-24 ENCOUNTER — Ambulatory Visit: Payer: Self-pay | Admitting: Internal Medicine

## 2023-08-08 ENCOUNTER — Encounter: Payer: Self-pay | Admitting: Podiatry

## 2023-08-12 ENCOUNTER — Other Ambulatory Visit: Payer: Self-pay | Admitting: Podiatry

## 2023-08-14 ENCOUNTER — Other Ambulatory Visit: Payer: Self-pay | Admitting: *Deleted

## 2023-08-14 DIAGNOSIS — I83813 Varicose veins of bilateral lower extremities with pain: Secondary | ICD-10-CM

## 2023-09-04 ENCOUNTER — Ambulatory Visit (HOSPITAL_COMMUNITY)
Admission: RE | Admit: 2023-09-04 | Discharge: 2023-09-04 | Disposition: A | Discharge status: Home / Self Care | Payer: Self-pay | Source: Ambulatory Visit | Attending: Surgery | Admitting: Surgery

## 2023-09-04 DIAGNOSIS — I83813 Varicose veins of bilateral lower extremities with pain: Secondary | ICD-10-CM

## 2023-09-05 ENCOUNTER — Encounter: Payer: Self-pay | Admitting: Surgery

## 2023-09-05 ENCOUNTER — Ambulatory Visit (INDEPENDENT_AMBULATORY_CARE_PROVIDER_SITE_OTHER): Payer: Self-pay | Admitting: Surgery

## 2023-09-05 VITALS — BP 108/71 | HR 77 | Temp 98.0°F | Ht 65.0 in | Wt 158.3 lb

## 2023-09-05 DIAGNOSIS — I83813 Varicose veins of bilateral lower extremities with pain: Secondary | ICD-10-CM

## 2023-09-05 NOTE — Progress Notes (Signed)
[facility] [place]  Patient name: Tonya Harris MRN: [id number] DOB: 1981-03-14 Sex: female   REASON FOR VISIT:    Follow-up varicose vein  HISOTRY OF PRESENT ILLNESS:    Tonya Harris is a 42 y.o. 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  She was seen in [place] [facility] in 2022.  At that time she stated that her varicosities became prominent in her 40s when she was in college.  Her varicose veins got worse after her 4 pregnancies.  She intermittently wears compression, both thigh-high and knee-high.  She does get some swelling in her legs and complains of fatigue at the end of the day.  Her varicosities also become more prominent when she is on her legs a lot.   The patient denies any history of DVT.  There is no family history of venous disease however her mother had bad varicosities she reports some form of vein procedure in both legs in 2011   PAST MEDICAL HISTORY:   Past Medical History:  Diagnosis Date   Anxiety    Dyspnea-episodic    Palpitations      FAMILY HISTORY:   Family History  Problem Relation Age of Onset   Hypertension Mother    Hypertension Father     SOCIAL HISTORY:   Social History   Tobacco Use   Smoking status: Never   Smokeless tobacco: Never  Substance Use Topics   Alcohol use: Not Currently     ALLERGIES:   No Known Allergies   CURRENT MEDICATIONS:   Current Outpatient Medications  Medication Sig Dispense Refill   flecainide (TAMBOCOR) 50 MG tablet 25 mg 2 (two) times daily.     metoprolol succinate (TOPROL-XL) 25 MG 24 hr tablet TAKE 1 TABLET (25 MG TOTAL) BY MOUTH DAILY. 90 tablet 1   Multiple Vitamin (MULTIVITAMIN) tablet Take 1 tablet by mouth daily.     sertraline (ZOLOFT) 100 MG tablet Take 50 mg by mouth daily.     magnesium oxide (MAG-OX) 400 MG tablet TAKE 1 TABLET BY MOUTH EVERY DAY (Patient not taking: Reported on  09/05/2023) 90 tablet 1   meloxicam (MOBIC) 15 MG tablet TAKE 1 TABLET (15 MG TOTAL) BY MOUTH DAILY. (Patient not taking: Reported on 09/05/2023) 30 tablet 0   Current Facility-Administered Medications  Medication Dose Route Frequency Provider Last Rate Last Admin   dexamethasone (DECADRON) injection 4 mg  4 mg Intra-articular Once        triamcinolone acetonide (KENALOG) 10 MG/ML injection 2.5 mg  2.5 mg Intramuscular Once         REVIEW OF SYSTEMS:   [X]  denotes positive finding, [ ]  denotes negative finding Cardiac  Comments:  Chest pain or chest pressure:    Shortness of breath upon exertion:    Short of breath when lying flat:    Irregular heart rhythm:        Vascular    Pain in calf, thigh, or hip brought on by ambulation:    Pain in feet at night that wakes you up from your sleep:     Blood clot in your veins:    Leg swelling:  x       Pulmonary    Oxygen at home:    Productive cough:     Wheezing:         Neurologic    Sudden weakness in arms or legs:     Sudden numbness in  arms or legs:     Sudden onset of difficulty speaking or slurred speech:    Temporary loss of vision in one eye:     Problems with dizziness:         Gastrointestinal    Blood in stool:     Vomited blood:         Genitourinary    Burning when urinating:     Blood in urine:        Psychiatric    Major depression:         Hematologic    Bleeding problems:    Problems with blood clotting too easily:        Skin    Rashes or ulcers:        Constitutional    Fever or chills:      PHYSICAL EXAM:   Vitals:   09/05/23 1448  BP: 108/71  Pulse: 77  Temp: 98 F (36.7 C)  TempSrc: Temporal  SpO2: 95%  Weight: 158 lb 4.8 oz (71.8 kg)  Height: 5\' 5"  (1.651 m)    GENERAL: The patient is a well-nourished female, in no acute distress. The vital signs are documented above. CARDIAC: There is a regular rate and rhythm.  VASCULAR: SonoSite was used to evaluate the saphenous vein in  both legs.  In both legs, shortly after the saphenofemoral junction, the saphenous vein gets very small in caliber.  She does have prominent ropey varicosities bilaterally. PULMONARY: Non-labored respirations MUSCULOSKELETAL: There are no major deformities or cyanosis. NEUROLOGIC: No focal weakness or paresthesias are detected. SKIN: See photo below PSYCHIATRIC: The patient has a normal affect.     STUDIES:   I have reviewed the following reflux testing: RIGHT         Reflux NoRefluxReflux TimeDiameter cmsComments                          Yes                                   +--------------+---------+------+-----------+------------+--------+  CFV                    yes   >1 second                       +--------------+---------+------+-----------+------------+--------+  FV mid                  yes   >1 second                       +--------------+---------+------+-----------+------------+--------+  Popliteal    no                                              +--------------+---------+------+-----------+------------+--------+  GSV at SFJ              yes    >500 ms      0.97              +--------------+---------+------+-----------+------------+--------+  GSV prox thigh          yes    >500 ms      0.68              +--------------+---------+------+-----------+------------+--------+  GSV mid thigh no                            0.25              +--------------+---------+------+-----------+------------+--------+  GSV dist thighno                            0.23              +--------------+---------+------+-----------+------------+--------+  GSV at knee             yes    >500 ms      0.27              +--------------+---------+------+-----------+------------+--------+  GSV prox calf no                            0.19              +--------------+---------+------+-----------+------------+--------+  SSV Pop  Fossa no                            0.29              +--------------+---------+------+-----------+------------+--------+  SSV prox calf no                            0.24              +--------------+---------+------+-----------+------------+--------+     +--------------+---------+------+-----------+------------+--------+  LEFT         Reflux NoRefluxReflux TimeDiameter cmsComments                          Yes                                   +--------------+---------+------+-----------+------------+--------+  CFV                    yes   >1 second                       +--------------+---------+------+-----------+------------+--------+  FV mid                  yes   >1 second                       +--------------+---------+------+-----------+------------+--------+  GSV at SFJ              yes    >500 ms      0.95              +--------------+---------+------+-----------+------------+--------+  GSV prox thigh          yes    >500 ms      0.73              +--------------+---------+------+-----------+------------+--------+  GSV mid thigh           yes    >500 ms      0.65              +--------------+---------+------+-----------+------------+--------+  GSV dist thighno  0.25              +--------------+---------+------+-----------+------------+--------+  GSV at knee             yes    >500 ms      0.36              +--------------+---------+------+-----------+------------+--------+  GSV prox calf                                       NWV       +--------------+---------+------+-----------+------------+--------+  SSV Pop Fossa no        yes                 0.23              +--------------+---------+------+-----------+------------+--------+  SSV prox calf no                  MEDICAL ISSUES:   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  When I looked at her saphenous vein with ultrasound, I do not feel that there is a long enough runway to ablate her vein.  I proposed 2 options.  The first would be high ligation of her saphenous vein with stab phlebectomy of her varicosities.  The second will be just stab phlebectomy.  She is little reluctant to have general anesthesia for procedure and therefore we talked about doing stab phlebectomy alone.  I suspect this will take care of the painful varicosities and the heaviness that she experiences.  We discussed the details as well as the risks and benefits.  Will work on [place] to get her cost analysis.  We would begin with the right leg if she wishes to proceed.    Charlena Cross, MD, FACS [facility] [phone] Pager [phone]

## 2023-09-24 ENCOUNTER — Encounter: Payer: Self-pay | Admitting: Internal Medicine

## 2023-10-01 ENCOUNTER — Ambulatory Visit (INDEPENDENT_AMBULATORY_CARE_PROVIDER_SITE_OTHER): Payer: Self-pay | Admitting: Internal Medicine

## 2023-10-01 ENCOUNTER — Encounter: Payer: Self-pay | Admitting: Internal Medicine

## 2023-10-01 VITALS — BP 118/70 | HR 60 | Temp 98.2°F | Resp 16 | Ht 65.0 in | Wt 156.2 lb

## 2023-10-01 DIAGNOSIS — M722 Plantar fascial fibromatosis: Secondary | ICD-10-CM

## 2023-10-01 DIAGNOSIS — F419 Anxiety disorder, unspecified: Secondary | ICD-10-CM

## 2023-10-01 DIAGNOSIS — I8393 Asymptomatic varicose veins of bilateral lower extremities: Secondary | ICD-10-CM

## 2023-10-01 DIAGNOSIS — G901 Familial dysautonomia [Riley-Day]: Secondary | ICD-10-CM

## 2023-10-01 DIAGNOSIS — Z8639 Personal history of other endocrine, nutritional and metabolic disease: Secondary | ICD-10-CM

## 2023-10-01 DIAGNOSIS — Z0000 Encounter for general adult medical examination without abnormal findings: Secondary | ICD-10-CM

## 2023-10-01 DIAGNOSIS — Z13220 Encounter for screening for lipoid disorders: Secondary | ICD-10-CM

## 2023-10-01 DIAGNOSIS — R002 Palpitations: Secondary | ICD-10-CM

## 2023-10-01 DIAGNOSIS — Z23 Encounter for immunization: Secondary | ICD-10-CM

## 2023-10-01 DIAGNOSIS — Z1231 Encounter for screening mammogram for malignant neoplasm of breast: Secondary | ICD-10-CM

## 2023-10-01 LAB — CBC WITH DIFFERENTIAL/PLATELET
Basophils Absolute: 0 10*3/uL (ref 0.0–0.1)
Basophils Relative: 0.7 % (ref 0.0–3.0)
Eosinophils Absolute: 0.1 10*3/uL (ref 0.0–0.7)
Eosinophils Relative: 0.8 % (ref 0.0–5.0)
HCT: 39.8 % (ref 36.0–46.0)
Hemoglobin: 12.8 g/dL (ref 12.0–15.0)
Lymphocytes Relative: 28.5 % (ref 12.0–46.0)
Lymphs Abs: 1.7 10*3/uL (ref 0.7–4.0)
MCHC: 32.2 g/dL (ref 30.0–36.0)
MCV: 89.7 fL (ref 78.0–100.0)
Monocytes Absolute: 0.3 10*3/uL (ref 0.1–1.0)
Monocytes Relative: 5.5 % (ref 3.0–12.0)
Neutro Abs: 3.9 10*3/uL (ref 1.4–7.7)
Neutrophils Relative %: 64.5 % (ref 43.0–77.0)
Platelets: 282 10*3/uL (ref 150.0–400.0)
RBC: 4.44 Mil/uL (ref 3.87–5.11)
RDW: 13.5 % (ref 11.5–15.5)
WBC: 6.1 10*3/uL (ref 4.0–10.5)

## 2023-10-01 LAB — LIPID PANEL
Cholesterol: 181 mg/dL (ref 0–200)
HDL: 36.8 mg/dL — ABNORMAL LOW (ref >39.00)
LDL Cholesterol: 129 mg/dL — ABNORMAL HIGH (ref 0–99)
NonHDL: 144.48
Total CHOL/HDL Ratio: 5
Triglycerides: 75 mg/dL (ref 0.0–149.0)
VLDL: 15 mg/dL (ref 0.0–40.0)

## 2023-10-01 LAB — COMPREHENSIVE METABOLIC PANEL
ALT: 13 U/L (ref 0–35)
AST: 16 U/L (ref 0–37)
Albumin: 4.6 g/dL (ref 3.5–5.2)
Alkaline Phosphatase: 33 U/L — ABNORMAL LOW (ref 39–117)
BUN: 11 mg/dL (ref 6–23)
CO2: 28 meq/L (ref 19–32)
Calcium: 9.5 mg/dL (ref 8.4–10.5)
Chloride: 104 meq/L (ref 96–112)
Creatinine, Ser: 0.76 mg/dL (ref 0.40–1.20)
GFR: 98.01 mL/min (ref >60.00)
Glucose, Bld: 87 mg/dL (ref 70–99)
Potassium: 4.5 meq/L (ref 3.5–5.1)
Sodium: 139 meq/L (ref 135–145)
Total Bilirubin: 0.5 mg/dL (ref 0.2–1.2)
Total Protein: 7.1 g/dL (ref 6.0–8.3)

## 2023-10-01 LAB — VITAMIN D 25 HYDROXY (VIT D DEFICIENCY, FRACTURES): VITD: 42.98 ng/mL (ref 30.00–100.00)

## 2023-10-01 LAB — TSH: TSH: 1.53 u[IU]/mL (ref 0.35–5.50)

## 2023-10-01 LAB — VITAMIN B12: Vitamin B-12: 1349 pg/mL — ABNORMAL HIGH (ref 211–911)

## 2023-10-01 NOTE — Assessment & Plan Note (Signed)
Physical today 10/01/23. Discussed yearly mammograms starting at age 40. Schedule mammogram. PAP 09/18/22 - negative with negative HPV.

## 2023-10-01 NOTE — Progress Notes (Signed)
Subjective:    Patient ID: Tonya Harris, female    DOB: [birth year], 42 y.o.   MRN: [id number]  Patient here for  Chief Complaint  Patient presents with   Annual Exam    HPI Here for a physical exam. Seeing vascular surgery for f/u varicose veins (last visit - 09/05/23).  Saw podiatry 07/11/23 - bilateral plantar fasciitis. S/p injections and meloxicam.  Also stretching and wearing a brace. Had f/u with cardiology 04/19/23 - on flecainide. Also remains on toprol. She reports she is overall doing better.  Still feels some "palpitations/sensations" at times.  Discussed f/u with Dr Graciela Husbands.  Breathing overall stable.  Discussed importance of staying hydrated, eating regularly and wearing compression hose.  Working. Work is going well.  No abdominal pain or bowel change reported.     Past Medical History:  Diagnosis Date   Anxiety    Dyspnea-episodic    Palpitations    Past Surgical History:  Procedure Laterality Date   APPENDECTOMY     Family History  Problem Relation Age of Onset   Hypertension Mother    Hypertension Father    Social History   Socioeconomic History   Marital status: Married    Spouse name: Not on file   Number of children: 4   Years of education: Not on file   Highest education level: Master's degree (e.g., MA, MS, MEng, MEd, MSW, MBA)  Occupational History   Occupation: Runner, broadcasting/film/video   Occupation: Stay at home mom  Tobacco Use   Smoking status: Never   Smokeless tobacco: Never  Vaping Use   Vaping status: Never Used  Substance and Sexual Activity   Alcohol use: Not Currently   Drug use: Not Currently   Sexual activity: Yes    Partners: Male  Other Topics Concern   Not on file  Social History Narrative   Right handed      Master's degree in teaching       Stay at home mom of 4 children currently   Social Determinants of Health   Financial Resource Strain: Not on file  Food Insecurity: Not on file  Transportation Needs: Not on file  Physical  Activity: Not on file  Stress: Not on file  Social Connections: Unknown (03/10/2022)   [facility] from [facility]   Social Network    Social Network: Not on file     Review of Systems  Constitutional:  Negative for appetite change and unexpected weight change.  HENT:  Negative for congestion, sinus pressure and sore throat.   Eyes:  Negative for pain and visual disturbance.  Respiratory:  Negative for cough, chest tightness and shortness of breath.   Cardiovascular:  Positive for palpitations. Negative for chest pain and leg swelling.  Gastrointestinal:  Negative for abdominal pain, diarrhea, nausea and vomiting.  Genitourinary:  Negative for difficulty urinating and dysuria.  Musculoskeletal:  Negative for joint swelling and myalgias.  Skin:  Negative for color change and rash.  Neurological:  Negative for dizziness.       Some occasional headache.   Hematological:  Negative for adenopathy. Does not bruise/bleed easily.  Psychiatric/Behavioral:  Negative for agitation and dysphoric mood.        Objective:     BP 118/70   Pulse 60   Temp 98.2 F (36.8 C)   Resp 16   Ht 5\' 5"  (1.651 m)   Wt 156 lb 3.2 oz (70.9 kg)   SpO2 99%   BMI 25.99  kg/m  Wt Readings from Last 3 Encounters:  10/01/23 156 lb 3.2 oz (70.9 kg)  09/05/23 158 lb 4.8 oz (71.8 kg)  04/19/23 163 lb (73.9 kg)    Physical Exam Vitals reviewed.  Constitutional:      General: She is not in acute distress.    Appearance: Normal appearance. She is well-developed.  HENT:     Head: Normocephalic and atraumatic.     Right Ear: External ear normal.     Left Ear: External ear normal.  Eyes:     General: No scleral icterus.       Right eye: No discharge.        Left eye: No discharge.     Conjunctiva/sclera: Conjunctivae normal.  Neck:     Thyroid: No thyromegaly.  Cardiovascular:     Rate and Rhythm: Normal rate and regular rhythm.  Pulmonary:     Effort: No tachypnea, accessory muscle  usage or respiratory distress.     Breath sounds: Normal breath sounds. No decreased breath sounds or wheezing.  Chest:  Breasts:    Right: No inverted nipple, mass, nipple discharge or tenderness (no axillary adenopathy).     Left: No inverted nipple, mass, nipple discharge or tenderness (no axilarry adenopathy).  Abdominal:     General: Bowel sounds are normal.     Palpations: Abdomen is soft.     Tenderness: There is no abdominal tenderness.  Musculoskeletal:        General: No swelling or tenderness.     Cervical back: Neck supple.  Lymphadenopathy:     Cervical: No cervical adenopathy.  Skin:    Findings: No erythema or rash.  Neurological:     Mental Status: She is alert and oriented to person, place, and time.  Psychiatric:        Mood and Affect: Mood normal.        Behavior: Behavior normal.      Outpatient Encounter Medications as of 10/01/2023  Medication Sig   flecainide (TAMBOCOR) 50 MG tablet 25 mg 2 (two) times daily.   magnesium oxide (MAG-OX) 400 MG tablet TAKE 1 TABLET BY MOUTH EVERY DAY (Patient not taking: Reported on 09/05/2023)   metoprolol succinate (TOPROL-XL) 25 MG 24 hr tablet TAKE 1 TABLET (25 MG TOTAL) BY MOUTH DAILY.   Multiple Vitamin (MULTIVITAMIN) tablet Take 1 tablet by mouth daily.   sertraline (ZOLOFT) 100 MG tablet Take 50 mg by mouth daily.   [DISCONTINUED] meloxicam (MOBIC) 15 MG tablet TAKE 1 TABLET (15 MG TOTAL) BY MOUTH DAILY. (Patient not taking: Reported on 09/05/2023)   Facility-Administered Encounter Medications as of 10/01/2023  Medication   dexamethasone (DECADRON) injection 4 mg   triamcinolone acetonide (KENALOG) 10 MG/ML injection 2.5 mg     Lab Results  Component Value Date   WBC 6.1 10/01/2023   HGB 12.8 10/01/2023   HCT 39.8 10/01/2023   PLT 282.0 10/01/2023   GLUCOSE 87 10/01/2023   CHOL 181 10/01/2023   TRIG 75.0 10/01/2023   HDL 36.80 (L) 10/01/2023   LDLCALC 129 (H) 10/01/2023   ALT 13 10/01/2023   AST 16  10/01/2023   NA 139 10/01/2023   K 4.5 10/01/2023   CL 104 10/01/2023   CREATININE 0.76 10/01/2023   BUN 11 10/01/2023   CO2 28 10/01/2023   TSH 1.53 10/01/2023    VAS [place] LOWER EXTREMITY VENOUS REFLUX  Result Date: 09/04/2023  Lower Venous Reflux Study Patient Name:  Tonya Harris [facility]  Date of  Exam:   09/04/2023 Medical Rec #: [id number]          Accession #:    [id number] Date of Birth: 27-Feb-1981          Patient Gender: F Patient Age:   60 years Exam Location:  [facility] Procedure:      VAS [place] LOWER EXTREMITY VENOUS REFLUX Referring Phys: Coral Else --------------------------------------------------------------------------------  Indications: Varicosities.  Comparison Study: 05/23/2021 Lower extremity venous reflux Performing Technologist: Gertie Fey MHA, RDMS, RVT, RDCS  Examination Guidelines: A complete evaluation includes B-mode imaging, spectral Doppler, color Doppler, and power Doppler as needed of all accessible portions of each vessel. Bilateral testing is considered an integral part of a complete examination. Limited examinations for reoccurring indications may be performed as noted. The reflux portion of the exam is performed with the patient in reverse Trendelenburg. Significant venous reflux is defined as >500 ms in the superficial venous system, and >1 second in the deep venous system.  Venous Reflux Times +--------------+---------+------+-----------+------------+--------+ RIGHT         Reflux NoRefluxReflux TimeDiameter cmsComments                         Yes                                  +--------------+---------+------+-----------+------------+--------+ CFV                     yes   >1 second                      +--------------+---------+------+-----------+------------+--------+ FV mid                  yes   >1 second                      +--------------+---------+------+-----------+------------+--------+ Popliteal     no                                              +--------------+---------+------+-----------+------------+--------+ GSV at SFJ              yes    >500 ms      0.97             +--------------+---------+------+-----------+------------+--------+ GSV prox thigh          yes    >500 ms      0.68             +--------------+---------+------+-----------+------------+--------+ GSV mid thigh no                            0.25             +--------------+---------+------+-----------+------------+--------+ GSV dist thighno                            0.23             +--------------+---------+------+-----------+------------+--------+ GSV at knee             yes    >500 ms      0.27             +--------------+---------+------+-----------+------------+--------+ GSV  prox calf no                            0.19             +--------------+---------+------+-----------+------------+--------+ SSV Pop Fossa no                            0.29             +--------------+---------+------+-----------+------------+--------+ SSV prox calf no                            0.24             +--------------+---------+------+-----------+------------+--------+  +--------------+---------+------+-----------+------------+--------+ LEFT          Reflux NoRefluxReflux TimeDiameter cmsComments                         Yes                                  +--------------+---------+------+-----------+------------+--------+ CFV                     yes   >1 second                      +--------------+---------+------+-----------+------------+--------+ FV mid                  yes   >1 second                      +--------------+---------+------+-----------+------------+--------+ GSV at SFJ              yes    >500 ms      0.95             +--------------+---------+------+-----------+------------+--------+ GSV prox thigh          yes    >500 ms      0.73              +--------------+---------+------+-----------+------------+--------+ GSV mid thigh           yes    >500 ms      0.65             +--------------+---------+------+-----------+------------+--------+ GSV dist thighno                            0.25             +--------------+---------+------+-----------+------------+--------+ GSV at knee             yes    >500 ms      0.36             +--------------+---------+------+-----------+------------+--------+ GSV prox calf                                       NWV      +--------------+---------+------+-----------+------------+--------+ SSV Pop Fossa no        yes                 0.23             +--------------+---------+------+-----------+------------+--------+ SSV prox calf no  0.17             +--------------+---------+------+-----------+------------+--------+   Summary: Right: - No evidence of deep vein thrombosis seen in the right lower extremity, from the common femoral through the popliteal veins. - No evidence of superficial venous thrombosis in the right lower extremity. - The deep venous system is incompetent at the common femoral vein and mid femoral vein. - The great saphenous vein is incompetent. 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 - The small saphenous vein is competent.  Left: - No evidence of deep vein thrombosis seen in the left lower extremity, from the common femoral through the popliteal veins. - No evidence of superficial venous thrombosis in the left lower extremity. - The deep venous system is incompetent at the common femoral vein and mid femoral vein. - The great saphenous vein is incompetent. 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  *See table(s) above for measurements and observations. Electronically signed by Heath Lark on 09/04/2023 at 4:34:48 PM.     Final        Assessment & Plan:  Healthcare maintenance Assessment & Plan: Physical today 10/01/23. Discussed yearly mammograms starting at age 69. Schedule mammogram. PAP 09/18/22 - negative with negative HPV.    Dysautonomia [facility]) Assessment & Plan:  Had f/u with cardiology 04/19/23 - on flecainide. Also remains on toprol. She reports she is overall doing better.  Still feels some "palpitations/sensations" at times.  Discussed f/u with Dr Graciela Husbands.   Orders: -     CBC with Differential/Platelet -     Comprehensive metabolic panel -     TSH -     Vitamin B12  Screening cholesterol level -     Lipid panel  Encounter for screening mammogram for malignant neoplasm of breast -     3D Screening Mammogram, Left and Right; Future  History of vitamin D deficiency -     VITAMIN D 25 Hydroxy (Vit-D Deficiency, Fractures)  Palpitations Assessment & Plan: Overall doing better.  Continues on metoprolol.  On flecainide.  Does report still having some intermittent episodes.  Request f/u with Dr Graciela Husbands.  Continue current medication. Check cbc, metabolic panel and thyroid test to confirm no metabolic etiology. F/u with Dr Graciela Husbands as outlined.   Orders: -     Vitamin B12  Need for influenza vaccination -     Flu vaccine trivalent PF, 6mos and older(Flulaval,Afluria,Fluarix,Fluzone) -     VITAMIN D 25 Hydroxy (Vit-D Deficiency, Fractures)  Varicose veins of both lower extremities, unspecified whether complicated Assessment & Plan: Seeing vascular surgery for f/u varicose veins (last visit - 09/05/23).  Continue compression hose.    Plantar fasciitis Assessment & Plan:  Saw podiatry 07/11/23 - bilateral plantar fasciitis. S/p injections and meloxicam.  Also stretching and wearing a brace.    Anxiety Assessment & Plan: Continues on zoloft.  Followed by Dr Maryruth Bun. Overall appears to be doing better.       Dale Alpha, MD

## 2023-10-02 ENCOUNTER — Telehealth: Payer: Self-pay

## 2023-10-02 NOTE — Telephone Encounter (Signed)
-----   Message from [place] sent at 10/02/2023  6:06 AM EST ----- Notify - cholesterol levels are ok.  The bad cholesterol increased some from last check.  Good cholesterol has improved.  Continue exercise as tolerated. Vitamin D level, hgb, thyroid test, kidney function tests and liver function tests are wnl.  B12 level is not low.

## 2023-10-02 NOTE — Telephone Encounter (Signed)
Lvm for pt to give office a call back in regards to labs

## 2023-10-05 ENCOUNTER — Telehealth: Payer: Self-pay | Admitting: Internal Medicine

## 2023-10-05 ENCOUNTER — Encounter: Payer: Self-pay | Admitting: Internal Medicine

## 2023-10-05 NOTE — Assessment & Plan Note (Signed)
Had f/u with cardiology 04/19/23 - on flecainide. Also remains on toprol. She reports she is overall doing better.  Still feels some "palpitations/sensations" at times.  Discussed f/u with Dr Graciela Husbands.

## 2023-10-05 NOTE — Assessment & Plan Note (Signed)
Saw podiatry 07/11/23 - bilateral plantar fasciitis. S/p injections and meloxicam.  Also stretching and wearing a brace.

## 2023-10-05 NOTE — Assessment & Plan Note (Signed)
Overall doing better.  Continues on metoprolol.  On flecainide.  Does report still having some intermittent episodes.  Request f/u with Dr Graciela Husbands.  Continue current medication. Check cbc, metabolic panel and thyroid test to confirm no metabolic etiology. F/u with Dr Graciela Husbands as outlined.

## 2023-10-05 NOTE — Assessment & Plan Note (Signed)
Continues on zoloft.  Followed by Dr Maryruth Bun. Overall appears to be doing better.

## 2023-10-05 NOTE — Telephone Encounter (Signed)
Has seen Dr Graciela Husbands - EP cardiology [facility]) for tachy/palpitations.  On flecainide and toprol. Doing overall better. Still with some intermittent episodes.  Would like f/u with Dr Graciela Husbands to discuss.

## 2023-10-05 NOTE — Assessment & Plan Note (Signed)
Seeing vascular surgery for f/u varicose veins (last visit - 09/05/23).  Continue compression hose.

## 2023-10-08 NOTE — Telephone Encounter (Signed)
LM for patient. She can call [phone] to schedule her follow up with Dr Graciela Husbands. Seen [facility] 04/2023- recommended 33m follow up

## 2023-10-08 NOTE — Telephone Encounter (Signed)
Patient states she is returning a call from Rita Ohara, LPN.  I read Trisha's message to patient.  Patient states she will call to schedule a follow-up appointment with Dr. Graciela Husbands.    Patient states she noticed on her recent lab results that her Vitamin B12 level is high, and she is concerned about this.  Patient states she will send a MyChart message to Dr. Dale Splendora.

## 2023-10-10 NOTE — Telephone Encounter (Signed)
Reviewed message.  Can confirm with her if taking B12 or multivitamin with B12.  If so, document how much taking.  Can decrease intake.

## 2023-10-10 NOTE — Telephone Encounter (Signed)
LMTCB

## 2023-10-16 NOTE — Telephone Encounter (Signed)
Patient states she is returning a call from Rita Ohara, LPN.  I read Dr. Westley Hummer Scott's message to patient.  Patient states she is at work now and isn't sure how much B12 she has been taking, but she will check when she gets home.  Patient states she will send the information to [place] via MyChart once she has it.  Patient states she was taking a multivitamin daily which included B12, and then she would take an additional B12 supplement periodically.  Patient states she has since stopped the B12 supplement since she saw the results of her lab work.

## 2023-10-16 NOTE — Telephone Encounter (Signed)
LMTCB

## 2023-10-16 NOTE — Telephone Encounter (Signed)
noted 

## 2023-10-30 ENCOUNTER — Other Ambulatory Visit: Payer: Self-pay | Admitting: Internal Medicine

## 2023-11-12 ENCOUNTER — Ambulatory Visit: Payer: Self-pay | Admitting: Internal Medicine

## 2023-11-15 ENCOUNTER — Ambulatory Visit: Payer: Self-pay | Attending: Internal Medicine | Admitting: Internal Medicine

## 2023-11-15 ENCOUNTER — Encounter: Payer: Self-pay | Admitting: Internal Medicine

## 2023-11-15 VITALS — BP 106/69 | HR 63 | Ht 64.0 in | Wt 161.8 lb

## 2023-11-15 DIAGNOSIS — G901 Familial dysautonomia [Riley-Day]: Secondary | ICD-10-CM

## 2023-11-15 DIAGNOSIS — R000 Tachycardia, unspecified: Secondary | ICD-10-CM

## 2023-11-15 DIAGNOSIS — R002 Palpitations: Secondary | ICD-10-CM

## 2023-11-15 MED ORDER — METOPROLOL TARTRATE 25 MG PO TABS: 25.0000 mg | ORAL_TABLET | Freq: Two times a day (BID) | ORAL | 3 refills | Status: AC

## 2023-11-15 MED ORDER — FLECAINIDE ACETATE 50 MG PO TABS: 25.0000 mg | ORAL_TABLET | Freq: Two times a day (BID) | ORAL | Status: DC

## 2023-11-15 NOTE — Patient Instructions (Signed)
 Medication Instructions:  Your physician has recommended you make the following change in your medication:   ** Decrease Flecainide  50mg  - 1/2 tablet (25mg ) by mouth twice daily  ** Stop Metoprolol  Succinate  ** Begin Metoprolol  Tartrate 25mg  - 1 tablet by mouth twice daily   *If you need a refill on your cardiac medications before your next appointment, please call your pharmacy*   Lab Work: None ordered.  If you have labs (blood work) drawn today and your tests are completely normal, you will receive your results only by: MyChart Message (if you have MyChart) OR A paper copy in the mail If you have any lab test that is abnormal or we need to change your treatment, we will call you to review the results.   Testing/Procedures: None ordered.    Follow-Up: At [facility], you and your health needs are our priority.  As part of our continuing mission to provide you with exceptional heart care, we have created designated Provider Care Teams.  These Care Teams include your primary Cardiologist (physician) and Advanced Practice Providers (APPs -  Physician Assistants and Nurse Practitioners) who all work together to provide you with the care you need, when you need it.  We recommend signing up for the patient portal called MyChart.  Sign up information is provided on this After Visit Summary.  MyChart is used to connect with patients for Virtual Visits (Telemedicine).  Patients are able to view lab/test results, encounter notes, upcoming appointments, etc.  Non-urgent messages can be sent to your provider as well.   To learn more about what you can do with MyChart, go to [email or web address].    Your next appointment:   12 months

## 2023-11-15 NOTE — Progress Notes (Signed)
 Patient Care Team: Glendia Shad, MD as PCP - General (Internal Medicine) Lonni Slain, MD as PCP - Cardiology (Cardiology) Fernande Elspeth BROCKS, MD as PCP - Electrophysiology (Cardiology) Skeet Juliene SAUNDERS, DO as Consulting Physician (Neurology)   HPI  Tonya Harris is a 43 y.o. 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  Overall continues to feel much improved with scant jolts and if she does have them and they occur at the end of the day.  She is interestingly taking her flecainide  once daily  Some orthostatic LH--improves when she takes her salt replacement which is variable  Metoprolol  succinate has caused some fatigue.  She is used to   DATE TEST EF   4/21 Echo   60-65 %   2/23 CTA  Ca Score 0             Date Cr K Hgb  11/23 0.7 3.9 12.1   11/24 0.76 4.5 12.8     Records and Results Reviewed   Past Medical History:  Diagnosis Date   Anxiety    Dyspnea-episodic    Palpitations     Past Surgical History:  Procedure Laterality Date   APPENDECTOMY      Current Meds  Medication Sig   flecainide  (TAMBOCOR ) 50 MG tablet TAKE 1 TABLET BY MOUTH TWICE A DAY   magnesium  oxide (MAG-OX) 400 MG tablet TAKE 1 TABLET BY MOUTH EVERY DAY   metoprolol  succinate (TOPROL -XL) 25 MG 24 hr tablet TAKE 1 TABLET (25 MG TOTAL) BY MOUTH DAILY.   Multiple Vitamin (MULTIVITAMIN) tablet Take 1 tablet by mouth daily.   sertraline (ZOLOFT) 100 MG tablet Take 50 mg by mouth daily.   Current Facility-Administered Medications for the 11/15/23 encounter (Office Visit) with Fernande Elspeth BROCKS, MD  Medication   dexamethasone  (DECADRON ) injection 4 mg   triamcinolone  acetonide (KENALOG ) 10 MG/ML injection 2.5 mg    No Known Allergies    Review of Systems negative except  from HPI and PMH  Physical Exam BP 106/69 (BP Location: Left Arm, Patient Position: Sitting, Cuff Size: Normal)   Pulse 63   Ht 5' 4 (1.626 m)   Wt 161 lb 12.8 oz (73.4 kg)   SpO2 99%   BMI 27.77 kg/m  Well developed and nourished in no acute distress HENT normal Neck supple with JVP-  flat  Clear Regular rate and rhythm, no murmurs or gallops Abd-soft with active BS No Clubbing cyanosis edema Skin-warm and dry A & Oriented  Grossly normal sensory and motor function  ECG sinus at 66 Intervals 15/10/42  CrCl cannot be calculated (Patient's most recent lab result is older than the maximum 21 days allowed.).   Assessment and  Plan PVCs  Tachypalpitations  BP labile  Dysautonomia? PVCs are much improved.  She takes flecainide  daily.  Will have her take it 25 mg twice daily.  Her metoprolol  seems to be an issue with fatigue and she is useful do not know if she has just accommodated to the fatigue which is moderate concern.  Will have her change out metoprolol  to tartrate 25 twice daily and see how she feels.  Encouraged her to continue with salt and water repletion  Current twice daily.  Medicines are reviewed at length with the patient today .  The patient does not  have concerns regarding medicines.

## 2023-11-29 ENCOUNTER — Ambulatory Visit
Admission: RE | Admit: 2023-11-29 | Discharge: 2023-11-29 | Disposition: A | Discharge status: Home / Self Care | Payer: Self-pay | Source: Ambulatory Visit | Attending: Internal Medicine | Admitting: Internal Medicine

## 2023-11-29 DIAGNOSIS — Z1231 Encounter for screening mammogram for malignant neoplasm of breast: Secondary | ICD-10-CM | POA: Insufficient documentation

## 2023-12-05 ENCOUNTER — Other Ambulatory Visit: Payer: Self-pay | Admitting: Internal Medicine

## 2023-12-24 ENCOUNTER — Ambulatory Visit: Payer: Self-pay | Admitting: Internal Medicine

## 2024-02-05 ENCOUNTER — Encounter: Payer: Self-pay | Admitting: Internal Medicine

## 2024-02-12 ENCOUNTER — Telehealth: Payer: Self-pay | Admitting: Internal Medicine

## 2024-02-12 NOTE — Telephone Encounter (Signed)
 Called patient, advised that Dr.Klein was not back in office until Thursday this week- advised I would have him review the message, she just wants Dr.Kleins okay to have leg procedure completed (see mychart message).   Patient aware she will move appointment until she hears from him, until next week.   Patient thankful for call back.

## 2024-02-12 NOTE — Telephone Encounter (Signed)
 Follow Up:     Patient says she is waiting to hear from Dr Graciela Husbands. She says she is scheduled for a leg Ablation on this Thursday(02-14-24). She says she does not want to have it, until Dr Graciela Husbands says that it is alright for her to have it

## 2024-02-15 ENCOUNTER — Ambulatory Visit: Payer: Self-pay | Admitting: Internal Medicine

## 2024-02-18 ENCOUNTER — Encounter: Payer: Self-pay | Admitting: Internal Medicine

## 2024-04-07 ENCOUNTER — Ambulatory Visit: Payer: Self-pay | Admitting: Internal Medicine

## 2024-04-14 ENCOUNTER — Ambulatory Visit: Payer: Self-pay | Admitting: Internal Medicine

## 2024-05-05 ENCOUNTER — Ambulatory Visit: Payer: Self-pay | Admitting: Internal Medicine

## 2024-05-07 ENCOUNTER — Ambulatory Visit: Payer: Self-pay | Admitting: Podiatry

## 2024-06-02 ENCOUNTER — Ambulatory Visit: Payer: Self-pay | Admitting: Podiatry

## 2024-06-16 ENCOUNTER — Ambulatory Visit: Payer: Self-pay | Admitting: Internal Medicine

## 2024-07-17 ENCOUNTER — Telehealth: Payer: Self-pay | Admitting: Podiatry

## 2024-07-17 NOTE — Telephone Encounter (Signed)
 Called and left message to confirm appointment cancellation.

## 2024-07-23 ENCOUNTER — Ambulatory Visit: Payer: Self-pay | Admitting: Podiatry

## 2024-08-18 ENCOUNTER — Ambulatory Visit: Payer: Self-pay | Admitting: Internal Medicine

## 2024-09-09 ENCOUNTER — Other Ambulatory Visit: Payer: Self-pay

## 2024-09-09 MED ORDER — AMOXICILLIN-POT CLAVULANATE 500-125 MG PO TABS
1.0000 | ORAL_TABLET | Freq: Three times a day (TID) | ORAL | 0 refills | Status: AC
Start: 2024-09-09 — End: ?
  Filled 2024-09-09: qty 15, 5d supply, fill #0

## 2024-09-19 ENCOUNTER — Other Ambulatory Visit: Payer: Self-pay

## 2024-11-12 ENCOUNTER — Other Ambulatory Visit: Payer: Self-pay | Admitting: Internal Medicine

## 2024-11-12 DIAGNOSIS — Z1231 Encounter for screening mammogram for malignant neoplasm of breast: Secondary | ICD-10-CM

## 2024-11-21 ENCOUNTER — Encounter: Payer: Self-pay | Admitting: Internal Medicine

## 2024-11-21 ENCOUNTER — Ambulatory Visit (INDEPENDENT_AMBULATORY_CARE_PROVIDER_SITE_OTHER): Payer: Self-pay | Admitting: Internal Medicine

## 2024-11-21 ENCOUNTER — Other Ambulatory Visit (HOSPITAL_COMMUNITY)
Admission: RE | Admit: 2024-11-21 | Discharge: 2024-11-21 | Disposition: A | Source: Ambulatory Visit | Attending: Internal Medicine | Admitting: Internal Medicine

## 2024-11-21 VITALS — BP 116/70 | HR 82 | Temp 97.9°F | Ht 64.0 in | Wt 159.0 lb

## 2024-11-21 DIAGNOSIS — R350 Frequency of micturition: Secondary | ICD-10-CM

## 2024-11-21 DIAGNOSIS — J4530 Mild persistent asthma, uncomplicated: Secondary | ICD-10-CM | POA: Diagnosis not present

## 2024-11-21 DIAGNOSIS — E538 Deficiency of other specified B group vitamins: Secondary | ICD-10-CM

## 2024-11-21 DIAGNOSIS — R002 Palpitations: Secondary | ICD-10-CM | POA: Diagnosis not present

## 2024-11-21 DIAGNOSIS — G901 Familial dysautonomia [Riley-Day]: Secondary | ICD-10-CM | POA: Diagnosis not present

## 2024-11-21 DIAGNOSIS — F419 Anxiety disorder, unspecified: Secondary | ICD-10-CM

## 2024-11-21 DIAGNOSIS — Z124 Encounter for screening for malignant neoplasm of cervix: Secondary | ICD-10-CM | POA: Diagnosis present

## 2024-11-21 DIAGNOSIS — R1011 Right upper quadrant pain: Secondary | ICD-10-CM

## 2024-11-21 DIAGNOSIS — Z8639 Personal history of other endocrine, nutritional and metabolic disease: Secondary | ICD-10-CM

## 2024-11-21 DIAGNOSIS — Z13220 Encounter for screening for lipoid disorders: Secondary | ICD-10-CM | POA: Diagnosis not present

## 2024-11-21 DIAGNOSIS — I493 Ventricular premature depolarization: Secondary | ICD-10-CM

## 2024-11-21 DIAGNOSIS — Z0000 Encounter for general adult medical examination without abnormal findings: Secondary | ICD-10-CM

## 2024-11-21 LAB — COMPREHENSIVE METABOLIC PANEL WITH GFR
ALT: 14 U/L (ref 3–35)
AST: 15 U/L (ref 5–37)
Albumin: 4.6 g/dL (ref 3.5–5.2)
Alkaline Phosphatase: 36 U/L — ABNORMAL LOW (ref 39–117)
BUN: 12 mg/dL (ref 6–23)
CO2: 28 meq/L (ref 19–32)
Calcium: 9.3 mg/dL (ref 8.4–10.5)
Chloride: 105 meq/L (ref 96–112)
Creatinine, Ser: 0.72 mg/dL (ref 0.40–1.20)
GFR: 103.74 mL/min
Glucose, Bld: 89 mg/dL (ref 70–99)
Potassium: 4.5 meq/L (ref 3.5–5.1)
Sodium: 138 meq/L (ref 135–145)
Total Bilirubin: 0.5 mg/dL (ref 0.2–1.2)
Total Protein: 7 g/dL (ref 6.0–8.3)

## 2024-11-21 LAB — URINALYSIS, ROUTINE W REFLEX MICROSCOPIC
Bilirubin Urine: NEGATIVE
Hgb urine dipstick: NEGATIVE
Ketones, ur: NEGATIVE
Leukocytes,Ua: NEGATIVE
Nitrite: NEGATIVE
Specific Gravity, Urine: 1.01 (ref 1.000–1.030)
Total Protein, Urine: NEGATIVE
Urine Glucose: NEGATIVE
Urobilinogen, UA: 0.2 (ref 0.0–1.0)
pH: 8 (ref 5.0–8.0)

## 2024-11-21 LAB — CBC WITH DIFFERENTIAL/PLATELET
Basophils Absolute: 0 K/uL (ref 0.0–0.1)
Basophils Relative: 0.6 % (ref 0.0–3.0)
Eosinophils Absolute: 0 K/uL (ref 0.0–0.7)
Eosinophils Relative: 0.5 % (ref 0.0–5.0)
HCT: 40.4 % (ref 36.0–46.0)
Hemoglobin: 13.3 g/dL (ref 12.0–15.0)
Lymphocytes Relative: 29.2 % (ref 12.0–46.0)
Lymphs Abs: 1.7 K/uL (ref 0.7–4.0)
MCHC: 32.9 g/dL (ref 30.0–36.0)
MCV: 86.8 fl (ref 78.0–100.0)
Monocytes Absolute: 0.3 K/uL (ref 0.1–1.0)
Monocytes Relative: 5.3 % (ref 3.0–12.0)
Neutro Abs: 3.7 K/uL (ref 1.4–7.7)
Neutrophils Relative %: 64.4 % (ref 43.0–77.0)
Platelets: 270 K/uL (ref 150.0–400.0)
RBC: 4.65 Mil/uL (ref 3.87–5.11)
RDW: 13.6 % (ref 11.5–15.5)
WBC: 5.7 K/uL (ref 4.0–10.5)

## 2024-11-21 LAB — LIPID PANEL
Cholesterol: 175 mg/dL (ref 28–200)
HDL: 41.5 mg/dL
LDL Cholesterol: 118 mg/dL — ABNORMAL HIGH (ref 10–99)
NonHDL: 133.95
Total CHOL/HDL Ratio: 4
Triglycerides: 79 mg/dL (ref 10.0–149.0)
VLDL: 15.8 mg/dL (ref 0.0–40.0)

## 2024-11-21 LAB — VITAMIN D 25 HYDROXY (VIT D DEFICIENCY, FRACTURES): VITD: 26.55 ng/mL — ABNORMAL LOW (ref 30.00–100.00)

## 2024-11-21 LAB — MAGNESIUM: Magnesium: 1.9 mg/dL (ref 1.5–2.5)

## 2024-11-21 LAB — TSH: TSH: 1.65 u[IU]/mL (ref 0.35–5.50)

## 2024-11-21 LAB — VITAMIN B12: Vitamin B-12: 743 pg/mL (ref 211–911)

## 2024-11-21 MED ORDER — MUPIROCIN 2 % EX OINT: 1.0000 | TOPICAL_OINTMENT | Freq: Two times a day (BID) | CUTANEOUS | 0 refills | Status: AC

## 2024-11-21 NOTE — Assessment & Plan Note (Addendum)
 Physical today 11/21/24. Mammogram 11/29/23 - Birads I. Scheduled for f/u mammogram 12/22/24. PAP 09/18/22 - negative with negative HPV. Repeat pap today.

## 2024-11-21 NOTE — Progress Notes (Signed)
 "  Subjective:    Patient ID: Tonya Harris, female    DOB: 07/05/1981, 44 y.o.   MRN: [id number]  Patient here for  Chief Complaint  Patient presents with   Annual Exam    HPI Here for a physical exam. Has seen Dr Fernande f/u PVCs. On flecainide . Improved. Last instructed to take 25mg  bid. Also, instructed to take metoprolol  tartrate 25mg  bid. Taking q day and doing well. Has seen vascular surgery - f/u varicose veins. Saw podiatry 07/11/23 - bilateral plantar fasciitis. S/p injections and meloxicam . Also stretching and recommended a brace. Wears compression hose. Continues on zoloft. Followed by Dr Chipper. Overall doing better. Working. Appears to be handling stress. Does report some right lower quadrant pain. Intermittent. Feels full. Due to start her period next week. No nausea or vomiting. No bowel change reported. Minimal acid reflux. Some increased urinary frequency and urgency. Also reports - nasal lesion.    Past Medical History:  Diagnosis Date   Anxiety    Dyspnea-episodic    Palpitations    Past Surgical History:  Procedure Laterality Date   APPENDECTOMY     Family History  Problem Relation Age of Onset   Hypertension Mother    Hypertension Father    Social History   Socioeconomic History   Marital status: Married    Spouse name: Not on file   Number of children: 4   Years of education: Not on file   Highest education level: Master's degree (e.g., MA, MS, MEng, MEd, MSW, MBA)  Occupational History   Occupation: Runner, Broadcasting/film/video   Occupation: Stay at home mom  Tobacco Use   Smoking status: Never   Smokeless tobacco: Never  Vaping Use   Vaping status: Never Used  Substance and Sexual Activity   Alcohol use: Not Currently   Drug use: Not Currently   Sexual activity: Yes    Partners: Male  Other Topics Concern   Not on file  Social History Narrative   Right handed      Master's degree in teaching       Stay at home mom of 4 children currently   Social Drivers  of Health   Tobacco Use: Low Risk (11/29/2024)   Patient History    Smoking Tobacco Use: Never    Smokeless Tobacco Use: Never    Passive Exposure: Not on file  Financial Resource Strain: Not on file  Food Insecurity: Not on file  Transportation Needs: Not on file  Physical Activity: Not on file  Stress: Not on file  Social Connections: Unknown (03/10/2022)   Received from [facility]   Social Network    Social Network: Not on file  Depression (PHQ2-9): Low Risk (11/21/2024)   Depression (PHQ2-9)    PHQ-2 Score: 3  Alcohol Screen: Not on file  Housing: Not on file  Utilities: Not on file  Health Literacy: Not on file     Review of Systems  Constitutional:  Negative for appetite change and unexpected weight change.  HENT:  Negative for congestion and sinus pressure.        Nasal lesion.   Respiratory:  Negative for cough, chest tightness and shortness of breath.   Cardiovascular:  Negative for chest pain and palpitations.       No increased swelling.   Gastrointestinal:  Negative for nausea and vomiting.       Right lower quadrant pain as outlined.   Genitourinary:  Positive for frequency and urgency. Negative for difficulty urinating and  dysuria.  Musculoskeletal:  Negative for myalgias.  Skin:  Negative for color change and rash.  Neurological:  Negative for headaches.       No increased dizziness.   Psychiatric/Behavioral:  Negative for agitation and dysphoric mood.        Objective:     BP 116/70   Pulse 82   Temp 97.9 F (36.6 C) (Oral)   Ht 5' 4 (1.626 m)   Wt 159 lb (72.1 kg)   SpO2 95%   BMI 27.29 kg/m  Wt Readings from Last 3 Encounters:  11/21/24 159 lb (72.1 kg)  11/15/23 161 lb 12.8 oz (73.4 kg)  10/01/23 156 lb 3.2 oz (70.9 kg)    Physical Exam Vitals reviewed.  Constitutional:      General: She is not in acute distress.    Appearance: Normal appearance. She is well-developed.  HENT:     Head: Normocephalic and atraumatic.     Right Ear:  External ear normal.     Left Ear: External ear normal.     Mouth/Throat:     Pharynx: No oropharyngeal exudate or posterior oropharyngeal erythema.  Eyes:     General: No scleral icterus.       Right eye: No discharge.        Left eye: No discharge.     Conjunctiva/sclera: Conjunctivae normal.  Neck:     Thyroid: No thyromegaly.  Cardiovascular:     Rate and Rhythm: Normal rate and regular rhythm.  Pulmonary:     Effort: No tachypnea, accessory muscle usage or respiratory distress.     Breath sounds: Normal breath sounds. No decreased breath sounds or wheezing.  Chest:  Breasts:    Right: No inverted nipple, mass, nipple discharge or tenderness (no axillary adenopathy).     Left: No inverted nipple, mass, nipple discharge or tenderness (no axilarry adenopathy).  Abdominal:     General: Bowel sounds are normal.     Palpations: Abdomen is soft.     Tenderness: There is no abdominal tenderness.  Genitourinary:    Comments: Normal external genitalia.  Vaginal vault without lesions.  Cervix identified.  Pap smear performed.  Could not appreciate any adnexal masses or tenderness.   Musculoskeletal:        General: No swelling or tenderness.     Cervical back: Neck supple.  Lymphadenopathy:     Cervical: No cervical adenopathy.  Skin:    Findings: No erythema or rash.  Neurological:     Mental Status: She is alert and oriented to person, place, and time.  Psychiatric:        Mood and Affect: Mood normal.        Behavior: Behavior normal.         Outpatient Encounter Medications as of 11/21/2024  Medication Sig   amoxicillin -clavulanate (AUGMENTIN ) 500-125 MG tablet Take 1 tablet by mouth 3 (three) times daily until gone.   flecainide  (TAMBOCOR ) 50 MG tablet Take 0.5 tablets (25 mg total) by mouth 2 (two) times daily.   magnesium  oxide (MAG-OX) 400 MG tablet TAKE 1 TABLET BY MOUTH EVERY DAY   metoprolol  tartrate (LOPRESSOR ) 25 MG tablet Take 1 tablet (25 mg total) by mouth 2  (two) times daily.   Multiple Vitamin (MULTIVITAMIN) tablet Take 1 tablet by mouth daily.   mupirocin  ointment (BACTROBAN ) 2 % Apply 1 Application topically 2 (two) times daily.   sertraline (ZOLOFT) 100 MG tablet Take 50 mg by mouth daily.   [DISCONTINUED] metoprolol  succinate (  TOPROL -XL) 25 MG 24 hr tablet TAKE 1 TABLET (25 MG TOTAL) BY MOUTH DAILY.   Facility-Administered Encounter Medications as of 11/21/2024  Medication   dexamethasone  (DECADRON ) injection 4 mg   triamcinolone  acetonide (KENALOG ) 10 MG/ML injection 2.5 mg     Lab Results  Component Value Date   WBC 5.7 11/21/2024   HGB 13.3 11/21/2024   HCT 40.4 11/21/2024   PLT 270.0 11/21/2024   GLUCOSE 89 11/21/2024   CHOL 175 11/21/2024   TRIG 79.0 11/21/2024   HDL 41.50 11/21/2024   LDLCALC 118 (H) 11/21/2024   ALT 14 11/21/2024   AST 15 11/21/2024   NA 138 11/21/2024   K 4.5 11/21/2024   CL 105 11/21/2024   CREATININE 0.72 11/21/2024   BUN 12 11/21/2024   CO2 28 11/21/2024   TSH 1.65 11/21/2024    MM 3D SCREENING MAMMOGRAM BILATERAL BREAST Result Date: 12/01/2023 CLINICAL DATA:  Screening. EXAM: DIGITAL SCREENING BILATERAL MAMMOGRAM WITH TOMOSYNTHESIS AND CAD TECHNIQUE: Bilateral screening digital craniocaudal and mediolateral oblique mammograms were obtained. Bilateral screening digital breast tomosynthesis was performed. The images were evaluated with computer-aided detection. COMPARISON:  None available. ACR Breast Density Category d: The breast tissue is extremely dense, which lowers the sensitivity of mammography. FINDINGS: There are no findings suspicious for malignancy. IMPRESSION: No mammographic evidence of malignancy. A result letter of this screening mammogram will be mailed directly to the patient. RECOMMENDATION: Screening mammogram in one year. (Code:SM-B-01Y) BI-RADS CATEGORY  1: Negative. Electronically Signed   By: Dina  Arceo M.D.   On: 12/01/2023 08:23       Assessment & Plan:  Routine general  medical examination at a health care facility  Screening for cervical cancer -     Cytology - PAP  Dysautonomia [facility]) Assessment & Plan: Has been seeing cardiology - on flecainide . Also remains on toprol . She reports she is overall doing better.  Still feels some palpitations/sensations at times, but overall improved. Continue f/u with cardiology.   Orders: -     Comprehensive metabolic panel with GFR -     TSH -     CBC with Differential/Platelet  Palpitations Assessment & Plan: Overall doing better.  Continues on metoprolol .  On flecainide . Contiue f/u with cardiology.    Screening cholesterol level -     Lipid panel  Healthcare maintenance Assessment & Plan: Physical today 11/21/24. Mammogram 11/29/23 - Birads I. Scheduled for f/u mammogram 12/22/24. PAP 09/18/22 - negative with negative HPV. Repeat pap today.    B12 deficiency Assessment & Plan: Check b12 level.    History of vitamin D  deficiency Assessment & Plan: Check vitamin D  level.   Orders: -     VITAMIN D  25 Hydroxy (Vit-D Deficiency, Fractures)  PVC's (premature ventricular contractions) -     Magnesium  -     Vitamin B12  Urinary frequency Assessment & Plan: Check urine to confirm no infection.   Orders: -     Urine Culture -     Urinalysis, Routine w reflex microscopic  Right upper quadrant abdominal pain Assessment & Plan: Right lower quadrant pain as outlined. Bowels moving. Pelvic exam as outlined. Check urine to confirm no infection, given increased urinary frequency and urgency. Check labs, including cbc, metabolic panel and liver function tests.  Further w/up pending results. Consider scan/ultrasound if persistent.    Anxiety Assessment & Plan: Continues on zoloft.  Followed by Dr Chipper. Appears to be doing better. Feels better. Working. Follow.    Mild persistent asthma  without complication Assessment & Plan: Breathing stable.    Other orders -     Mupirocin ; Apply 1 Application  topically 2 (two) times daily.  Dispense: 22 g; Refill: 0     Allena Hamilton, MD "

## 2024-11-22 ENCOUNTER — Ambulatory Visit: Payer: Self-pay | Admitting: Internal Medicine

## 2024-11-22 LAB — URINE CULTURE
MICRO NUMBER:: 17479258
SPECIMEN QUALITY:: ADEQUATE

## 2024-11-24 LAB — CYTOLOGY - PAP
Comment: NEGATIVE
Diagnosis: NEGATIVE
High risk HPV: NEGATIVE

## 2024-11-25 ENCOUNTER — Encounter: Payer: Self-pay | Admitting: Internal Medicine

## 2024-11-27 ENCOUNTER — Encounter: Payer: Self-pay | Admitting: Emergency Medicine

## 2024-11-27 ENCOUNTER — Ambulatory Visit
Admission: EM | Admit: 2024-11-27 | Discharge: 2024-11-27 | Disposition: A | Discharge status: Home / Self Care | Attending: Emergency Medicine | Admitting: Emergency Medicine

## 2024-11-27 ENCOUNTER — Ambulatory Visit: Payer: Self-pay | Admitting: *Deleted

## 2024-11-27 DIAGNOSIS — R1031 Right lower quadrant pain: Secondary | ICD-10-CM

## 2024-11-27 DIAGNOSIS — R102 Pelvic and perineal pain unspecified side: Secondary | ICD-10-CM

## 2024-11-27 MED ORDER — ALUMINUM-MAGNESIUM-SIMETHICONE 200-200-20 MG/5ML PO SUSP: 30.0000 mL | Freq: Three times a day (TID) | ORAL | 0 refills | Status: AC

## 2024-11-27 MED ORDER — DICLOFENAC SODIUM 75 MG PO TBEC: 75.0000 mg | DELAYED_RELEASE_TABLET | Freq: Two times a day (BID) | ORAL | 0 refills | Status: AC

## 2024-11-27 MED ORDER — OMEPRAZOLE 20 MG PO CPDR: 20.0000 mg | DELAYED_RELEASE_CAPSULE | Freq: Every day | ORAL | 0 refills | Status: AC

## 2024-11-27 NOTE — Discharge Instructions (Signed)
 Today you are evaluated for your abdominal pain, on exam there is tenderness in the right lower quadrant which overlays your intestine as your appendix is no longer present, also having some right sided pelvic pain which well could be muscular overlies the female genitalia concerning which based on placement could be concerning for cyst or fibroids, you are able to sit and have a conversation without retching and pain and are not wincing or becoming tearful during examination while this does need to be followed up I do not believe that you need to be evaluated in the emergency department at this time  As you have had persisting indigestion we will start you on a medicine for acid reflux and indigestion to determine if this is contributing to your symptoms especially the pain in the right lower quadrant  Begin omeprazole  daily  May use Maalox every 6 hours as needed, this is a mixture of antacid and gas reduction medicine, may take when indigestion bloating and increased gas is cleared  In an attempt to treat muscular pain and cover for any inflammation within the pelvic region will attempt a prescription anti-inflammatory medicine therefore stop use of ibuprofen  You may use diclofenac  twice daily, may take Tylenol additionally as needed  You may help warm compresses to the affected area in 10 to 15-minute intervals  Until gas and indigestion is reduced please eat a bland diet to prevent further triggering of symptoms avoiding spicy and greasy foods, large quantities of alcohol and caffeine and foods heavily base in tomatoes and onion  To help with digestion after consumption of meals set up for at least 30 minutes, with walking and movement also helps to break up gas and will help with indigestion when present  Please schedule follow-up appointment with your primary doctor for reevaluation as I do believe if symptoms do not resolve you will need imaging such as an abdominal or pelvic  ultrasound  At any point if your pain worsens in severity please go to the nearest emergency department for immediate evaluation

## 2024-11-27 NOTE — Telephone Encounter (Signed)
 FYI Only or Action Required?: FYI only for provider: appointment scheduled on 1/23.  Patient was last seen in primary care on 11/21/2024 by Glendia Shad, MD.  Called Nurse Triage reporting Abdominal Pain.  Symptoms began today.  Interventions attempted: OTC medications: ibuprofen.  Symptoms are: stable.  Triage Disposition: See Physician Within 24 Hours  Patient/caregiver understands and will follow disposition?: Yes  Message from [place] G sent at 11/27/2024 11:38 AM EST  Reason for Triage: Pt stated that pain on her lower right side of stomach\pelvis area woke her up in the middle of the night and wants to follow back up. symptoms are worsening. Pt recently had an appt with pcp on 01/16.   Reason for Disposition  [1] MODERATE pain (e.g., interferes with normal activities) AND [2] pain comes and goes (cramps) AND [3] present > 24 hours  (Exception: Pain with Vomiting or Diarrhea - see that Guideline.)    Pain comes and goes- can last 1 hour and can be severe when occurring. No pain now.  Answer Assessment - Initial Assessment Questions 1. LOCATION: Where does it hurt?      LRQ 2. RADIATION: Does the pain shoot anywhere else? (e.g., chest, back)     Same spot 3. ONSET: When did the pain begin? (e.g., minutes, hours or days ago)      Last night- woke her up, started few months ago and did discuss with PCP 4. SUDDEN: Gradual or sudden onset?     sudden 5. PATTERN Does the pain come and go, or is it constant?     Comes and goes 6. SEVERITY: How bad is the pain?  (e.g., Scale 1-10; mild, moderate, or severe)     Severe when occurring - lasting over 1 hour 7. RECURRENT SYMPTOM: Have you ever had this type of stomach pain before? If Yes, ask: When was the last time? and What happened that time?      no 8. CAUSE: What do you think is causing the stomach pain? (e.g., gallstones, recent abdominal surgery)     Possible food/indigestion  9. RELIEVING/AGGRAVATING  FACTORS: What makes it better or worse? (e.g., antacids, bending or twisting motion, bowel movement)     Ibuprofen/heating pad 10. OTHER SYMPTOMS: Do you have any other symptoms? (e.g., back pain, diarrhea, fever, urination pain, vomiting)       Diarrhea/constipation recent  Protocols used: Abdominal Pain - [facility]

## 2024-11-27 NOTE — ED Provider Notes (Signed)
 " CAY RALPH PELT    CSN: [id number] Arrival date & time: 11/27/24  1709      History   Chief Complaint Chief Complaint  Patient presents with   Groin Pain    HPI Tonya Harris is a 44 y.o. female.   Patient presents for evaluation of intermittent right-sided groin pain present for 1 month, over the past 1 to 2 weeks has become more frequent and prominent.  Has had episodes where she is woken up from from her sleep in severe pain last occurrence yesterday evening.  Occurring in the same spot and does not radiate.  Not exacerbated by movement or consumption of food.  Has been experiencing increased gas production, indigestion and abdominal bloating, not currently , has has experienced fluctuations in stool ranging from small to formed brown stools to diarrhea, nocturia nightly taking medication for indigestion.  Has attempted use of ibuprofen and a heating pad with minimal improvement.  Saw PCP 1 week ago for physical, discussed and thought to be muscular and advised to monitor.  History of appendectomy.  Last menstruation 11/24/2024.  At times feels to be experiencing urinary frequency but denies dysuria or hematuria and complete urinalysis for same symptoms 1 week ago, negative.  Denies vaginal symptoms.  Past Medical History:  Diagnosis Date   Anxiety    Dyspnea-episodic    Palpitations     Patient Active Problem List   Diagnosis Date Noted   B12 deficiency 11/21/2024   History of vitamin D  deficiency 10/01/2023   Plantar fasciitis 05/28/2023   Dysautonomia (HCC) 04/17/2023   PVC's (premature ventricular contractions) 04/17/2023   Foot pain 05/28/2022   Urinary frequency 11/21/2021   Headache 05/06/2021   Varicose veins of both lower extremities 03/26/2021   Joint stiffness 02/07/2021   Throat irritation 11/14/2020   Chest discomfort 07/06/2020   Asthma, mild persistent 06/28/2020   Fatigue 01/29/2020   Dysphagia 01/12/2020   Elevated blood pressure reading  12/20/2019   Palpitations 10/19/2019   Hypoglycemia 09/07/2019   Cough 07/11/2019   SOB (shortness of breath) 06/30/2019   Weakness 06/22/2019   Coccygeal pain 04/12/2019   Abdominal pain 12/22/2018   Healthcare maintenance 12/19/2018   Sweating abnormality 09/08/2018   History of gestational diabetes 09/08/2018   Anxiety 09/08/2018   Muscle ache 09/02/2018   Muscle twitching 09/02/2018   ANAL PRURITUS 09/27/2009    Past Surgical History:  Procedure Laterality Date   APPENDECTOMY      OB History   No obstetric history on file.      Home Medications    Prior to Admission medications  Medication Sig Start Date End Date Taking? Authorizing Provider  flecainide  (TAMBOCOR ) 50 MG tablet Take 0.5 tablets (25 mg total) by mouth 2 (two) times daily. 11/15/23  Yes Fernande Elspeth BROCKS, MD  metoprolol  tartrate (LOPRESSOR ) 25 MG tablet Take 1 tablet (25 mg total) by mouth 2 (two) times daily. 11/15/23  Yes Fernande Elspeth BROCKS, MD  sertraline (ZOLOFT) 100 MG tablet Take 50 mg by mouth daily.   Yes [provider]  amoxicillin -clavulanate (AUGMENTIN ) 500-125 MG tablet Take 1 tablet by mouth 3 (three) times daily until gone. 09/09/24     magnesium  oxide (MAG-OX) 400 MG tablet TAKE 1 TABLET BY MOUTH EVERY DAY 10/13/21   Glendia Shad, MD  Multiple Vitamin (MULTIVITAMIN) tablet Take 1 tablet by mouth daily.    [provider]  mupirocin  ointment (BACTROBAN ) 2 % Apply 1 Application topically 2 (two) times daily. 11/21/24  Glendia Shad, MD    Family History Family History  Problem Relation Age of Onset   Hypertension Mother    Hypertension Father     Social History Social History[1]   Allergies   Patient has no known allergies.   Review of Systems Review of Systems   Physical Exam Triage Vital Signs ED Triage Vitals  Encounter Vitals Group     BP 11/27/24 1744 113/76     Girls Systolic BP Percentile --      Girls Diastolic BP Percentile --      Boys Systolic BP  Percentile --      Boys Diastolic BP Percentile --      Pulse Rate 11/27/24 1744 70     Resp 11/27/24 1744 16     Temp 11/27/24 1744 98.1 F (36.7 C)     Temp Source 11/27/24 1744 Oral     SpO2 11/27/24 1744 97 %     Weight --      Height --      Head Circumference --      Peak Flow --      Pain Score 11/27/24 1742 9     Pain Loc --      Pain Education --      Exclude from Growth Chart --    No data found.  Updated Vital Signs BP 113/76 (BP Location: Right Arm)   Pulse 70   Temp 98.1 F (36.7 C) (Oral)   Resp 16   LMP 11/24/2024   SpO2 97%   Visual Acuity Right Eye Distance:   Left Eye Distance:   Bilateral Distance:    Right Eye Near:   Left Eye Near:    Bilateral Near:     Physical Exam Constitutional:      Appearance: Normal appearance.  Eyes:     Extraocular Movements: Extraocular movements intact.  Pulmonary:     Effort: Pulmonary effort is normal.  Abdominal:      Comments: Point tenderness to the right lower quadrant into the right pelvic region, no ecchymosis swelling present, abdominal wall symmetrical, no bloating or distention present, soft and fluctuant  Neurological:     Mental Status: She is alert and oriented to person, place, and time. Mental status is at baseline.      UC Treatments / Results  Labs (all labs ordered are listed, but only abnormal results are displayed) Labs Reviewed - No data to display  EKG   Radiology No results found.  Procedures Procedures (including critical care time)  Medications Ordered in UC Medications - No data to display  Initial Impression / Assessment and Plan / UC Course  I have reviewed the triage vital signs and the nursing notes.  Pertinent labs & imaging results that were available during my care of the patient were reviewed by me and considered in my medical decision making (see chart for details).  Right lower quadrant abdominal pain, pelvic pain  Vitals are stable, patient in no signs  of distress nontoxic-appearing, tenderness noted on exam, nonguarding and able to sit comfortably throughout examination, stable for outpatient management will defer ED evaluation at this time differential diagnoses including GERD, muscular pain, ovarian cyst, fibroids, will move forward for covering for all possibility as imaging is currently unavailable, prescribed omeprazole  and Maalox and discussed administration recommended supportive care through physical activity sitting up after consumption of meals and a bland diet, prescribing diclofenac  for muscular pain as well as for inflammation of the cyst, advised  to monitor closely advised PCP follow-up for reevaluation and potential imaging and given strict ER precautions for at any point if abdominal pain becomes severe Final Clinical Impressions(s) / UC Diagnoses   Final diagnoses:  None   Discharge Instructions   None    ED Prescriptions   None    PDMP not reviewed this encounter.     [1]  Social History Tobacco Use   Smoking status: Never   Smokeless tobacco: Never  Vaping Use   Vaping status: Never Used  Substance Use Topics   Alcohol use: Not Currently   Drug use: Not Currently     Teresa Shelba SAUNDERS, NP 11/27/24 1826  "

## 2024-11-27 NOTE — Telephone Encounter (Signed)
 If worsening right lower quadrant pain - would recommend evaluation today.  Want to make sure nothing more acute going on.

## 2024-11-27 NOTE — Telephone Encounter (Signed)
Evaluated at UC

## 2024-11-27 NOTE — ED Triage Notes (Signed)
 Pt presents with right side groin pain for several weeks. Pt has taken ibuprofen and placed a warm compress on the area with some relief.

## 2024-11-28 ENCOUNTER — Ambulatory Visit: Admitting: Internal Medicine

## 2024-11-29 ENCOUNTER — Encounter: Payer: Self-pay | Admitting: Internal Medicine

## 2024-11-29 NOTE — Assessment & Plan Note (Signed)
 Overall doing better.  Continues on metoprolol .  On flecainide . Contiue f/u with cardiology.

## 2024-11-29 NOTE — Assessment & Plan Note (Signed)
 Has been seeing cardiology - on flecainide . Also remains on toprol . She reports she is overall doing better.  Still feels some palpitations/sensations at times, but overall improved. Continue f/u with cardiology.

## 2024-11-29 NOTE — Assessment & Plan Note (Signed)
Check urine to confirm no infection.  

## 2024-11-29 NOTE — Assessment & Plan Note (Signed)
Check b12 level  

## 2024-11-29 NOTE — Assessment & Plan Note (Signed)
 Continues on zoloft.  Followed by Dr Chipper. Appears to be doing better. Feels better. Working. Follow.

## 2024-11-29 NOTE — Assessment & Plan Note (Signed)
 Right lower quadrant pain as outlined. Bowels moving. Pelvic exam as outlined. Check urine to confirm no infection, given increased urinary frequency and urgency. Check labs, including cbc, metabolic panel and liver function tests.  Further w/up pending results. Consider scan/ultrasound if persistent.

## 2024-11-29 NOTE — Assessment & Plan Note (Signed)
 Check vitamin D level

## 2024-11-29 NOTE — Assessment & Plan Note (Signed)
 Breathing stable.

## 2024-12-10 ENCOUNTER — Other Ambulatory Visit: Payer: Self-pay | Admitting: Cardiology

## 2024-12-10 DIAGNOSIS — R000 Tachycardia, unspecified: Secondary | ICD-10-CM

## 2024-12-11 NOTE — Telephone Encounter (Signed)
 Please contact pt for future appointment. Pt due for follow up.

## 2025-03-23 ENCOUNTER — Ambulatory Visit: Admitting: Internal Medicine
